# Patient Record
Sex: Female | Born: 1985 | Race: White | Hispanic: Yes | Marital: Married | Smoking: Never smoker
Health system: Southern US, Community
[De-identification: ages and names within clinical notes are randomized; demographics above are authoritative.]

## PROBLEM LIST (undated history)

## (undated) DIAGNOSIS — R12 Heartburn: Secondary | ICD-10-CM

## (undated) DIAGNOSIS — Z789 Other specified health status: Secondary | ICD-10-CM

## (undated) DIAGNOSIS — F419 Anxiety disorder, unspecified: Secondary | ICD-10-CM

## (undated) DIAGNOSIS — M2550 Pain in unspecified joint: Secondary | ICD-10-CM

## (undated) DIAGNOSIS — R5383 Other fatigue: Secondary | ICD-10-CM

## (undated) DIAGNOSIS — R519 Headache, unspecified: Secondary | ICD-10-CM

## (undated) HISTORY — DX: Anxiety disorder, unspecified: F41.9

## (undated) HISTORY — PX: WISDOM TOOTH EXTRACTION: SHX21

## (undated) HISTORY — DX: Heartburn: R12

## (undated) HISTORY — DX: Other fatigue: R53.83

## (undated) HISTORY — DX: Pain in unspecified joint: M25.50

## (undated) HISTORY — DX: Other specified health status: Z78.9

## (undated) HISTORY — DX: Headache, unspecified: R51.9

---

## 2009-08-30 ENCOUNTER — Inpatient Hospital Stay (HOSPITAL_COMMUNITY): Admission: AD | Admit: 2009-08-30 | Discharge: 2009-08-30 | Payer: Self-pay | Admitting: Anesthesiology

## 2009-10-05 ENCOUNTER — Ambulatory Visit (HOSPITAL_COMMUNITY): Admission: RE | Admit: 2009-10-05 | Discharge: 2009-10-05 | Payer: Self-pay | Admitting: Family Medicine

## 2010-02-15 ENCOUNTER — Ambulatory Visit (HOSPITAL_COMMUNITY)
Admission: RE | Admit: 2010-02-15 | Discharge: 2010-02-15 | Payer: Self-pay | Source: Home / Self Care | Attending: Family Medicine | Admitting: Family Medicine

## 2010-02-19 ENCOUNTER — Encounter: Payer: Self-pay | Admitting: Obstetrics & Gynecology

## 2010-02-19 ENCOUNTER — Inpatient Hospital Stay (HOSPITAL_COMMUNITY)
Admission: AD | Admit: 2010-02-19 | Discharge: 2010-02-22 | Payer: Self-pay | Source: Home / Self Care | Attending: Obstetrics & Gynecology | Admitting: Obstetrics & Gynecology

## 2010-02-20 LAB — CBC
HCT: 28.4 % — ABNORMAL LOW (ref 36.0–46.0)
Hemoglobin: 9.4 g/dL — ABNORMAL LOW (ref 12.0–15.0)
MCH: 29.9 pg (ref 26.0–34.0)
MCHC: 33.1 g/dL (ref 30.0–36.0)
MCV: 90.4 fL (ref 78.0–100.0)
Platelets: 153 10*3/uL (ref 150–400)
RBC: 3.14 MIL/uL — ABNORMAL LOW (ref 3.87–5.11)
RDW: 14.5 % (ref 11.5–15.5)
WBC: 11.4 10*3/uL — ABNORMAL HIGH (ref 4.0–10.5)

## 2010-04-29 LAB — CBC
HCT: 36.3 % (ref 36.0–46.0)
Hemoglobin: 12.1 g/dL (ref 12.0–15.0)
MCH: 29.9 pg (ref 26.0–34.0)
MCHC: 33.3 g/dL (ref 30.0–36.0)
MCV: 89.6 fL (ref 78.0–100.0)
Platelets: 167 10*3/uL (ref 150–400)
RBC: 4.05 MIL/uL (ref 3.87–5.11)
RDW: 14.1 % (ref 11.5–15.5)
WBC: 14 10*3/uL — ABNORMAL HIGH (ref 4.0–10.5)

## 2010-04-29 LAB — RPR: RPR Ser Ql: NONREACTIVE

## 2010-04-29 LAB — ABO/RH: ABO/RH(D): O POS

## 2010-05-05 LAB — CBC
HCT: 33.8 % — ABNORMAL LOW (ref 36.0–46.0)
Hemoglobin: 11.9 g/dL — ABNORMAL LOW (ref 12.0–15.0)
MCH: 32.7 pg (ref 26.0–34.0)
MCHC: 35.1 g/dL (ref 30.0–36.0)
MCV: 93 fL (ref 78.0–100.0)
Platelets: 228 10*3/uL (ref 150–400)
RBC: 3.64 MIL/uL — ABNORMAL LOW (ref 3.87–5.11)
RDW: 13.3 % (ref 11.5–15.5)
WBC: 12.9 10*3/uL — ABNORMAL HIGH (ref 4.0–10.5)

## 2010-05-05 LAB — URINALYSIS, ROUTINE W REFLEX MICROSCOPIC
Bilirubin Urine: NEGATIVE
Glucose, UA: NEGATIVE mg/dL
Hgb urine dipstick: NEGATIVE
Ketones, ur: NEGATIVE mg/dL
Nitrite: NEGATIVE
Protein, ur: NEGATIVE mg/dL
Specific Gravity, Urine: 1.025 (ref 1.005–1.030)
Urobilinogen, UA: 0.2 mg/dL (ref 0.0–1.0)
pH: 6 (ref 5.0–8.0)

## 2010-05-05 LAB — COMPREHENSIVE METABOLIC PANEL
ALT: 15 U/L (ref 0–35)
AST: 16 U/L (ref 0–37)
Albumin: 3.3 g/dL — ABNORMAL LOW (ref 3.5–5.2)
Alkaline Phosphatase: 40 U/L (ref 39–117)
BUN: 5 mg/dL — ABNORMAL LOW (ref 6–23)
CO2: 24 mEq/L (ref 19–32)
Calcium: 8.5 mg/dL (ref 8.4–10.5)
Chloride: 105 mEq/L (ref 96–112)
Creatinine, Ser: 0.37 mg/dL — ABNORMAL LOW (ref 0.4–1.2)
GFR calc Af Amer: >60 mL/min (ref >60)
GFR calc non Af Amer: >60 mL/min (ref >60)
Glucose, Bld: 88 mg/dL (ref 70–99)
Potassium: 3.5 mEq/L (ref 3.5–5.1)
Sodium: 132 mEq/L — ABNORMAL LOW (ref 135–145)
Total Bilirubin: 0.3 mg/dL (ref 0.3–1.2)
Total Protein: 6.3 g/dL (ref 6.0–8.3)

## 2010-05-05 LAB — WET PREP, GENITAL
Clue Cells Wet Prep HPF POC: NONE SEEN
Trich, Wet Prep: NONE SEEN
Yeast Wet Prep HPF POC: NONE SEEN

## 2010-05-05 LAB — GC/CHLAMYDIA PROBE AMP, GENITAL
Chlamydia, DNA Probe: NEGATIVE
GC Probe Amp, Genital: NEGATIVE

## 2010-05-17 NOTE — Discharge Summary (Signed)
  NAME:  Lacey Sanders, Lacey Sanders  ACCOUNT NO.:  [id number]  MEDICAL RECORD NO.:  [id number]          PATIENT TYPE:  INP  LOCATION:  9143                          FACILITY:  [facility]  PHYSICIAN:  Allie Bossier, MD        DATE OF BIRTH:  1985/04/14  DATE OF ADMISSION:  02/19/2010 DATE OF DISCHARGE:  02/22/2010                              DISCHARGE SUMMARY   REASON FOR ADMISSION:  Spontaneous rupture of membranes, pregnancy at term.  FINAL DIAGNOSES: 1. Intrauterine pregnancy at 40.4 weeks. 2. Placental abruption. 3. Nonreassuring fetal heart tones.  Labs on the day of discharge; hemoglobin 9.4, hematocrit 28.4, platelets within normal limits.  PROCEDURES:  Primary low transverse cesarean section via Pfannenstiel incision and drain of the left paratubal.  HISTORY AND PHYSICAL:  For complete history and physical, please refer to the history and physical at the time of admission.  In brief, this is a 25 year old gravida 1, para 0, who presented with 40 and 4 weeks with a spontaneous rupture of membranes and increased vaginal bleeding.  HOSPITAL COURSE:  This is a 25 year old gravida 1, para 2, who presented to the hospital with 40 and 4 weeks with spontaneous rupture of membranes and increased vaginal bleed.  During the hospitalization, the patient was doing okay, but at that time, baby is having some late decelerations with moderate variability and positive acceleration.  The cervix was 1 cm, 70% effaced.  Because of the abruption and the rupture of membranes and nonreassuring fetal heart tones, decided to proceed to have low transverse cesarean section to finally augment the pregnancy. During this procedure, no complications were reported.  On the day of discharge, the patient was doing okay with hemoglobin and hematocrit was stable.  We proceeded to discharge the patient.  Baby weight 3600 g. Apgars at 1 minute 3, 5 minutes 7, 10 minutes 8.  FOLLOWUP INSTRUCTIONS:  ACTIVITY:   Bedrest for 6 weeks.  DIET:  Routine diet.  MEDICATIONS: 1. Motrin 600 mg q.6 hours. 2. Percocet 325/5 q.4 hours for pain. 3. Micronor patch.  The patient plans to breast-feeding.  Follow up will be __________ 6 weeks.    ______________________________ Presley Raddle, MD   ______________________________ Allie Bossier, MD    AM/MEDQ  D:  02/22/2010  T:  02/22/2010  Job:  [id number]  Electronically Signed by Nicholaus Bloom MD on 05/17/2010 11:21:21 AM

## 2012-08-19 ENCOUNTER — Ambulatory Visit: Payer: No Typology Code available for payment source | Attending: Family Medicine | Admitting: Internal Medicine

## 2012-08-19 VITALS — BP 121/77 | HR 62 | Temp 97.5°F | Ht 60.5 in | Wt 139.0 lb

## 2012-08-19 DIAGNOSIS — R109 Unspecified abdominal pain: Secondary | ICD-10-CM

## 2012-08-19 LAB — POCT URINE PREGNANCY: Preg Test, Ur: NEGATIVE

## 2012-08-19 NOTE — Progress Notes (Unsigned)
Patient ID: Lacey Sanders, female   DOB: Feb 06, 1986, 27 y.o.   MRN: [id number]  CC:  HPI: 27 year old female who is here for evaluation of pelvic pain and left and right lower quadrant. The patient does not complain of any diarrhea or constipation, denies any nausea vomiting. The pain is not related to her menstrual cramps. The patient has pain in between her menstrual cramps. She is currently on oral contraceptive medication. She denies any history of endometriosis. Her periods are every 3 months.    No Known Allergies No past medical history on file. No current outpatient prescriptions on file prior to visit.   No current facility-administered medications on file prior to visit.   No family history on file. History   Social History  . Marital Status: Single    Spouse Name: N/A    Number of Children: N/A  . Years of Education: N/A   Occupational History  . Not on file.   Social History Main Topics  . Smoking status: Never Smoker   . Smokeless tobacco: Not on file  . Alcohol Use: No  . Drug Use: No  . Sexually Active: Not on file   Other Topics Concern  . Not on file   Social History Narrative  . No narrative on file    Review of Systems  Constitutional: Negative for fever, chills, diaphoresis, activity change, appetite change and fatigue.  HENT: Negative for ear pain, nosebleeds, congestion, facial swelling, rhinorrhea, neck pain, neck stiffness and ear discharge.   Eyes: Negative for pain, discharge, redness, itching and visual disturbance.  Respiratory: Negative for cough, choking, chest tightness, shortness of breath, wheezing and stridor.   Cardiovascular: Negative for chest pain, palpitations and leg swelling.  Gastrointestinal: Negative for abdominal distention.  Genitourinary: Negative for dysuria, urgency, frequency, hematuria, flank pain, decreased urine volume, difficulty urinating and dyspareunia.  Musculoskeletal: Negative for back pain, joint  swelling, arthralgias and gait problem.  Neurological: Negative for dizziness, tremors, seizures, syncope, facial asymmetry, speech difficulty, weakness, light-headedness, numbness and headaches.  Hematological: Negative for adenopathy. Does not bruise/bleed easily.  Psychiatric/Behavioral: Negative for hallucinations, behavioral problems, confusion, dysphoric mood, decreased concentration and agitation.    Objective:   Filed Vitals:   08/19/12 1615  BP: 121/77  Pulse: 62  Temp: 97.5 F (36.4 C)    Physical Exam  Constitutional: Appears well-developed and well-nourished. No distress.  HENT: Normocephalic. External right and left ear normal. Oropharynx is clear and moist.  Eyes: Conjunctivae and EOM are normal. PERRLA, no scleral icterus.  Neck: Normal ROM. Neck supple. No JVD. No tracheal deviation. No thyromegaly.  CVS: RRR, S1/S2 +, no murmurs, no gallops, no carotid bruit.  Pulmonary: Effort and breath sounds normal, no stridor, rhonchi, wheezes, rales.  Abdominal: Soft. BS +,  no distension, tenderness, rebound or guarding.  Musculoskeletal: Normal range of motion. No edema and no tenderness.  Lymphadenopathy: No lymphadenopathy noted, cervical, inguinal. Neuro: Alert. Normal reflexes, muscle tone coordination. No cranial nerve deficit. Skin: Skin is warm and dry. No rash noted. Not diaphoretic. No erythema. No pallor.  Psychiatric: Normal mood and affect. Behavior, judgment, thought content normal.   Lab Results  Component Value Date   WBC 11.4* 02/20/2010   HGB 9.4 DELTA CHECK NOTED REPEATED TO VERIFY* 02/20/2010   HCT 28.4* 02/20/2010   MCV 90.4 02/20/2010   PLT 153 02/20/2010   Lab Results  Component Value Date   CREATININE 0.37* 08/30/2009   BUN 5* 08/30/2009   NA 132* 08/30/2009  K 3.5 08/30/2009   CL 105 08/30/2009   CO2 24 08/30/2009    No results found for this basename: HGBA1C   Lipid Panel  No results found for this basename: chol, trig, hdl, cholhdl, vldl, ldlcalc        Assessment and plan:   There are no active problems to display for this patient.   Pelvic pain #1 obtain CBC, CMP, lipase #2 pelvic ultrasound will rule out the appearance is, fibroid, #3 urine pregnancy test as the patient stopped her contraceptive a week ago #4 followup visit in one week, I would encourage to repeat her urine pregnancy test and at that point as well #5 if the pelvic ultrasound was normal she may need a gynecologic referral as well as a CT scan for further evaluation of her pelvic pain

## 2012-08-20 LAB — COMPREHENSIVE METABOLIC PANEL
ALT: 19 U/L (ref 0–35)
AST: 20 U/L (ref 0–37)
Albumin: 4.6 g/dL (ref 3.5–5.2)
Alkaline Phosphatase: 49 U/L (ref 39–117)
BUN: 10 mg/dL (ref 6–23)
CO2: 31 mEq/L (ref 19–32)
Calcium: 9.5 mg/dL (ref 8.4–10.5)
Chloride: 102 mEq/L (ref 96–112)
Creat: 0.67 mg/dL (ref 0.50–1.10)
Glucose, Bld: 76 mg/dL (ref 70–99)
Potassium: 3.7 mEq/L (ref 3.5–5.3)
Sodium: 140 mEq/L (ref 135–145)
Total Bilirubin: 0.3 mg/dL (ref 0.3–1.2)
Total Protein: 7.5 g/dL (ref 6.0–8.3)

## 2012-08-20 LAB — URINALYSIS W MICROSCOPIC + REFLEX CULTURE
Bacteria, UA: NONE SEEN
Bilirubin Urine: NEGATIVE
Casts: NONE SEEN
Crystals: NONE SEEN
Glucose, UA: NEGATIVE mg/dL
Hgb urine dipstick: NEGATIVE
Ketones, ur: NEGATIVE mg/dL
Leukocytes, UA: NEGATIVE
Nitrite: NEGATIVE
Protein, ur: NEGATIVE mg/dL
Specific Gravity, Urine: 1.009 (ref 1.005–1.030)
Squamous Epithelial / HPF: NONE SEEN
Urobilinogen, UA: 0.2 mg/dL (ref 0.0–1.0)
pH: 7 (ref 5.0–8.0)

## 2012-08-23 ENCOUNTER — Telehealth: Payer: Self-pay | Admitting: Family Medicine

## 2012-08-23 NOTE — Telephone Encounter (Signed)
Pt was told by [place] to call [phone] to schedule appt for utlrasound. When she called she was told that the appointment must be made by clinical staff here at [facility].

## 2012-08-24 LAB — CBC WITH DIFFERENTIAL/PLATELET

## 2012-08-25 ENCOUNTER — Ambulatory Visit (HOSPITAL_COMMUNITY)
Admission: RE | Admit: 2012-08-25 | Discharge: 2012-08-25 | Disposition: A | Discharge status: Home / Self Care | Payer: No Typology Code available for payment source | Source: Ambulatory Visit | Attending: Internal Medicine | Admitting: Internal Medicine

## 2012-08-25 DIAGNOSIS — N949 Unspecified condition associated with female genital organs and menstrual cycle: Secondary | ICD-10-CM | POA: Insufficient documentation

## 2012-08-25 DIAGNOSIS — R109 Unspecified abdominal pain: Secondary | ICD-10-CM

## 2012-08-25 DIAGNOSIS — N854 Malposition of uterus: Secondary | ICD-10-CM | POA: Insufficient documentation

## 2012-08-26 ENCOUNTER — Ambulatory Visit: Payer: No Typology Code available for payment source

## 2012-08-27 ENCOUNTER — Other Ambulatory Visit (HOSPITAL_COMMUNITY)
Admission: RE | Admit: 2012-08-27 | Discharge: 2012-08-27 | Disposition: A | Discharge status: Home / Self Care | Payer: No Typology Code available for payment source | Source: Ambulatory Visit | Attending: Family Medicine | Admitting: Family Medicine

## 2012-08-27 ENCOUNTER — Ambulatory Visit: Payer: No Typology Code available for payment source | Attending: Family Medicine | Admitting: Internal Medicine

## 2012-08-27 VITALS — BP 120/71 | HR 66 | Temp 98.4°F | Resp 18 | Ht 65.0 in | Wt 140.0 lb

## 2012-08-27 DIAGNOSIS — R109 Unspecified abdominal pain: Secondary | ICD-10-CM | POA: Insufficient documentation

## 2012-08-27 DIAGNOSIS — Z113 Encounter for screening for infections with a predominantly sexual mode of transmission: Secondary | ICD-10-CM | POA: Insufficient documentation

## 2012-08-27 DIAGNOSIS — M25559 Pain in unspecified hip: Secondary | ICD-10-CM | POA: Insufficient documentation

## 2012-08-27 DIAGNOSIS — N760 Acute vaginitis: Secondary | ICD-10-CM | POA: Insufficient documentation

## 2012-08-27 LAB — CBC
HCT: 41.8 % (ref 36.0–46.0)
Hemoglobin: 14.7 g/dL (ref 12.0–15.0)
MCH: 30.8 pg (ref 26.0–34.0)
MCHC: 35.2 g/dL (ref 30.0–36.0)
MCV: 87.6 fL (ref 78.0–100.0)
Platelets: 313 10*3/uL (ref 150–400)
RBC: 4.77 MIL/uL (ref 3.87–5.11)
RDW: 12.8 % (ref 11.5–15.5)
WBC: 8.6 10*3/uL (ref 4.0–10.5)

## 2012-08-27 NOTE — Progress Notes (Signed)
Lacey Sanders ID: Lacey Sanders, female   DOB: April 17, 1985, 27 y.o.   MRN: [id number] Lacey Sanders Demographics  Lacey Sanders, is a 27 y.o. female  WGN:[id number]  HQI:[id number]  DOB - 11-20-1985  Chief Complaint  Lacey Sanders presents with  . Follow-up    review lab and [place] of pelvis        Subjective:   Lacey Sanders with History of pelvic pain for the last one month was seen here last week is here for followup, pelvic pain and some better denies any vaginal discharge, denies any diarrhea blood or mucus in stool, pain is in the suprapubic area, no aggravating or relieving factors , no other associated symptoms. No fever chills.   Denies any subjective complaints except as above, no active headache, no chest abdominal pain at this time, not short of breath. No focal weakness which is new. no nausea vomiting.   Objective:    Lacey Sanders Active Problem List   Diagnosis Date Noted  . Pain in joint, pelvic region and thigh 08/27/2012     Filed Vitals:   08/27/12 1329 08/27/12 1330  BP: 120/71 120/71  Pulse: 66 66  Temp: 98.4 F (36.9 C) 98.4 F (36.9 C)  TempSrc: Oral Oral  Resp: 18 18  Height: 5\' 5"  (1.651 m) 5\' 5"  (1.651 m)  Weight: 140 lb (63.504 kg) 140 lb (63.504 kg)  SpO2: 100% 100%     Exam   Awake Alert, Oriented X 3, No new F.N deficits, Normal affect Struble.AT,PERRAL Supple Neck,No JVD, No cervical lymphadenopathy appriciated.  Symmetrical Chest wall movement, Good air movement bilaterally, CTAB RRR,No Gallops,Rubs or new Murmurs, No Parasternal Heave +ve B.Sounds, Abd Soft, Non tender, No organomegaly appriciated, No rebound - guarding or rigidity. No Cyanosis, Clubbing or edema, No new Rash or bruise    Pelvic exam - no adnexal tenderness no unusual vaginal discharge or odor     Data Review   CBC No results found for this basename: WBC, HGB, HCT, PLT, MCV, MCH, MCHC, RDW, NEUTRABS, LYMPHSABS, MONOABS, EOSABS, BASOSABS, BANDABS,  BANDSABD,  in the last 168 hours  Chemistries   No results found for this basename: NA, K, CL, CO2, GLUCOSE, BUN, CREATININE, GFRCGP, CALCIUM, MG, AST, ALT, ALKPHOS, BILITOT,  in the last 168 hours ------------------------------------------------------------------------------------------------------------------ No results found for this basename: HGBA1C,  in the last 72 hours ------------------------------------------------------------------------------------------------------------------ No results found for this basename: CHOL, HDL, LDLCALC, TRIG, CHOLHDL, LDLDIRECT,  in the last 72 hours ------------------------------------------------------------------------------------------------------------------ No results found for this basename: TSH, T4TOTAL, FREET3, T3FREE, THYROIDAB,  in the last 72 hours ------------------------------------------------------------------------------------------------------------------ No results found for this basename: VITAMINB12, FOLATE, FERRITIN, TIBC, IRON, RETICCTPCT,  in the last 72 hours  Coagulation profile  No results found for this basename: INR, PROTIME,  in the last 168 hours     Prior to Admission medications   Not on File     Assessment & Plan   Suprapubic pain ongoing for one month. CMP, UA urine pregnancy test and pelvic ultrasound done last month are unremarkable. Pelvic exam is unremarkable no adnexal tenderness, no unusual vaginal discharge, I will order urine ancillary test to rule out sexually transmitted disease along with a CBC. Lacey Sanders to come back in a week. If her pain discomfort continues we will repeat a urine pregnancy test and if negative order CT scan abdomen pelvis.      Routine health maintenance.  Screening labs. BMP checked last week stable we will order a CBC    Her  Pap smear done a few months ago at [facility] was unremarkable per Lacey Sanders     Leroy Sea M.D on 08/27/2012 at 1:41 PM

## 2012-08-27 NOTE — Progress Notes (Signed)
Patient states she is here to get results of urine test during last visit and [place] results of pelvis for pelvis pain. States pain is starting to get better.

## 2012-08-28 LAB — URINALYSIS W MICROSCOPIC + REFLEX CULTURE
Bacteria, UA: NONE SEEN
Bilirubin Urine: NEGATIVE
Casts: NONE SEEN
Crystals: NONE SEEN
Glucose, UA: NEGATIVE mg/dL
Hgb urine dipstick: NEGATIVE
Ketones, ur: NEGATIVE mg/dL
Leukocytes, UA: NEGATIVE
Nitrite: NEGATIVE
Protein, ur: NEGATIVE mg/dL
Specific Gravity, Urine: 1.009 (ref 1.005–1.030)
Squamous Epithelial / HPF: NONE SEEN
Urobilinogen, UA: 0.2 mg/dL (ref 0.0–1.0)
pH: 7.5 (ref 5.0–8.0)

## 2012-09-03 ENCOUNTER — Ambulatory Visit: Payer: No Typology Code available for payment source

## 2012-09-03 ENCOUNTER — Encounter: Payer: Self-pay | Admitting: Family Medicine

## 2012-09-03 ENCOUNTER — Ambulatory Visit: Payer: No Typology Code available for payment source | Attending: Family Medicine | Admitting: Family Medicine

## 2012-09-03 VITALS — BP 121/82 | HR 80 | Temp 98.1°F | Resp 16 | Ht 62.0 in | Wt 140.8 lb

## 2012-09-03 DIAGNOSIS — M25559 Pain in unspecified hip: Secondary | ICD-10-CM | POA: Insufficient documentation

## 2012-09-03 DIAGNOSIS — K589 Irritable bowel syndrome without diarrhea: Secondary | ICD-10-CM

## 2012-09-03 DIAGNOSIS — R100 Acute abdomen: Secondary | ICD-10-CM

## 2012-09-03 DIAGNOSIS — R1031 Right lower quadrant pain: Secondary | ICD-10-CM | POA: Insufficient documentation

## 2012-09-03 DIAGNOSIS — R109 Unspecified abdominal pain: Secondary | ICD-10-CM

## 2012-09-03 LAB — POCT URINALYSIS DIPSTICK
Bilirubin, UA: NEGATIVE
Glucose, UA: NEGATIVE
Ketones, UA: NEGATIVE
Leukocytes, UA: NEGATIVE
Nitrite, UA: NEGATIVE
Protein, UA: NEGATIVE
Spec Grav, UA: 1.015
Urobilinogen, UA: 0.2
pH, UA: 7.5

## 2012-09-03 LAB — POCT URINE PREGNANCY: Preg Test, Ur: NEGATIVE

## 2012-09-03 NOTE — Patient Instructions (Addendum)
OVER THE COUNTER ACID MEDICATIONS PRILOSEC, OMEPRAZOLE OR ZANTAC, RANITIDINE   Dieta para el reflujo gastroesofgico - Adultos  (Diet for Gastroesophageal Reflux Disease, Adult)  El reflujo (reflujo cido) ocurre cuando el cido del estmago pasa al esfago. Cuando el cido entra en contacto con el esfago, el cido provoca dolor e irritacin (inflamacin) en el esfago. Cuando el reflujo ocurre a menudo o es tan grave que causa dao en el esfago, se denomina enfermedad por reflujo gastroesofgico (ERGE). La terapia nutricional puede ayudar a Acupuncturist de la [place].  ALIMENTOS O BEBIDAS QUE DEBE EVITAR O LIMITAR   Fumar o consumir tabaco. La nicotina es uno de los estimulantes ms potentes en la produccin de cido en el tracto gastrointestinal.  Caf y t negro con cafena o descafeinado.  Gaseosas comunes o bajas caloras o bebidas energizantes (las gaseosas sin cafena estn permitidas).   Especias picantes, como la pimienta negra, pimienta blanca, pimienta roja, pimienta de cayena, curry en polvo,y [place] en polvo.  Menta y mentol.  Chocolate.  Alimentos con alto contenido de grasas, incluyendo las carnes y comidas fritas. El agregado de [place] extra, por ejemplo aceite, [place], aderezo para ensaladas y nueces. Limite estos alimentos a menos de 8 cucharaditas por da.  Las frutas y verduras si no son toleradas, tales como frutas ctricas o tomates.  El alcohol.  Todo alimento que agrave el trastorno. Si tiene dudas relacionadas con la dieta, comunquese con el profesional que lo asiste o con un nutricionista matriculado.  OTROS FACTORES QUE PUEDEN ALIVIAR EL ERGE SON:   Comer lentamente, en un clima distendido.  Hacer 5 o 6 comidas pequeas por da en vez de tres grandes.  Suprimir por un [employer] alimentos que causen problemas.  No acostarse hasta despus de 3 horas de haber comido.  Mantener la cabeza elevada 6 a 9 pulgadas (15 a 23 cm) usando una cua de espuma  o bloques debajo de las patas de la cama. Si permanece en una postura plana har empeorar los sntomas.  Mantngase fsicamente activo. Perder peso puede ser de ayuda para reducir el [employer] adultos obesos o con sobrepeso.  Use ropas sueltas. EJEMPLO DE UN PLAN DE ALIMENTACIN  Este plan de alimentacin consiste en aproximadamente 2 000 caloras, segn las guas de alimentacin de https://www.bernard.org/.  Desayuno   taza de avena cocida.  1 porcin de fresas.  1 taza de [employer].  1 oz de almendras. Colacin  1 taza de rebanadas de pepino.  6 oz de yogur (elaborado con [employer] con bajo contenido de grasas o descremada). Almuerzo:  2 rebanada de pan integral.  2 oz de rebanadas de pavo.  2 cucharaditas de mayonesa.  1 taza de arndanos.  1 taza de guisantes. Colacin  6 crackers integrales.  1 oz ( 28 g) de queso en hebras. Cena   taza de arroz integral.  1 taza de vegetales variados.  1 cucharadita de aceite de oliva.  3 oz ( 84 g) de pescado grill. Document Released: 11/13/2004 Document Revised: 04/28/2011 [facility] Patient Information 2014 [place], [place].   Sndrome de colon irritable (Irritable Bowel Syndrome) El sndrome de colon irritable se origina en un trastorno de la funcin normal del intestino Tambin se lo denomina colon espstico, colitis mucosa y colon irritable. No es necesario realizar un tratamiento quirrgico, ni es un problema que pueda transformarse en cncer. No hay cura para este sndrome. Pero con Neomia Dear dieta apropiada, reduccin del estrs y Tourist information centre manager, usted  notar que sus problemas (sntomas) gradualmente desaparecern o mejorarn. Se trata de una enfermedad frecuente del aparato digestivo. Aparece con frecuencia a finales de la adolescencia o comienzos de la vida adulta., La proporcin de mujeres que sufren este trastorno es del doble que los hombres. CAUSAS Luego que el alimento es digerido y absorbido en el intestino  delgado, Animator de desecho se dirige hacia el colon (intestino grueso). En el colon se absorben el agua y las sales que provienen de los alimentos no digeridos que se encuentran en el intestino delgado. Los residuos que [place], o materia fecal, se retiene para su posterior eliminacin. Bajo circunstancias normales, ciertas contracciones suaves y rtmicas de las paredes del intestino empujan la materia fecal a lo largo del colon, hacia el recto. Sin embargo, cuando se presenta este problema, estas contracciones son irregulares y no coordinadas. Entonces ocurre que la materia fecal se retiene por un perodo prolongado, lo que origina constipacin, o se expele demasiado rpido, lo que produce diarrea. SNTOMAS El sntoma ms frecuente es Chief Technology Officer. Lo ms frecuente es que el dolor se localice en la zona izquierda inferior del vientre (abdomen). Pero puede ocurrir en cualquier rea del abdomen. Puede sentirse como acidez de [place], Engineer, mining de espalda o Teacher, adult education un dolor sordo en los brazos o en los hombros. El dolor se origina en espasmos excesivos de los msculos del intestino y de la acumulacin de gases y materia fecal en el colon. Este dolor:  Puede oscilar entre clicos agudos en el vientre (abdomen) hasta un dolor sordo y continuo.  Generalmente empeora luego de comer.  Es caracterstico que se alivie al mover el intestino o [employer] gases. Generalmente se acompaa de constipacin. Pero tambin puede P[employer] Inc. La diarrea se produce inmediatamente despus de comer o al levantarse por la maana. Las heces son blandas y International aid/development worker. Con frecuencia estn mezcladas con secreciones (mucus). Otros sntomas son:  Product manager  [employer]  Prdida del apetito  Ganas de vomitar (nuseas)  Vmitos Esta enfermedad tambin puede causar otros sntomas que no se relacionan con el sistema digestivo.  Fatiga.  Estados de ansiedad  Dificultades de Librarian, academic.  Dolor de  [place].  Falta de [employer]. Estos sntomas tienden a Research officer, trade union y Geneticist, molecular. DIAGNSTICO Los sntomas se asemejan a los de otras enfermedades ms graves del aparato digestivo. Por lo tanto el profesional que lo asiste le indicar una serie adicional de anlisis para descartarlas. Debe estar seguro de hallar la causa (diagnostico) En este caso, se le explicar la naturaleza y el propsito de cada prueba. TRATAMIENTO Existe un buen nmero de medicamentos disponibles que ayudan a corregir el funcionamiento intestinal y/o a Occupational hygienist intestinales y Chief Technology Officer abdominal. Los medicamentos disponibles son:  Jodi Marble, no irritantes para los casos de constipacin grave y para Contractor a Dietitian.  Medicamentos antidiarreicos especficos para tratar los [place] graves o prolongados de [place].  Antiespasmdicos para Copywriter, advertising.  El profesional que lo asiste tambin podr indicarle un tranquilizante o sedante suave durante los perodos extraordinarios de estrs en su vida. Lo ms importante es recordar que si le prescriben un medicamento, deber tomarlo exactamente como se lo han indicado. Hgale saber al profesional que lo asiste si ha obtenido alivio al tomarlo. INSTRUCCIONES PARA EL CUIDADO DOMICILIARIO  Evite los alimentos ricos en grasas o aceites. Por ejemplo, crema, manteca, salchichas, embutidos y otras comidas grasas.  Evite los alimentos que puedan [employer]  laxantes, como frutas, jugos de fruta y productos lcteos.  Elimine las bebidas gaseosas, la goma de [place] y los alimentos que producen gases como frijoles y col. Esto ayuda a Technical sales engineer hinchazn y los eructos.  Consuma salvado con gran cantidad de lquidos puede ayudar a combatir la constipacin.  Observe cules son los alimentos que originan sus sntomas.  Evite las situaciones de gran carga emocional o las circunstancias que producen  ansiedad.  Comience o contine con un plan de ejercicios.  Descanse y duerma lo suficiente. EST SEGURO QUE:   Comprende las instrucciones para el alta mdica.  Controlar su enfermedad.  Solicitar atencin mdica de inmediato segn las indicaciones. Document Released: 02/03/2005 Document Revised: 04/28/2011 [facility] Patient Information 2014 [place], [place].

## 2012-09-03 NOTE — Progress Notes (Signed)
Patient ID: Lacey Sanders, female   DOB: 05-03-1985, 27 y.o.   MRN: [id number]  CC: follow up  Interpreter used to communicate  HPI: Pt reports that she is having much less abdominal pain but it has been intermittent.  She says that she does have acid reflux symptoms on occasion with spicy foods.  She is not aware of foods that could be causing more problems with abdominal pain.  She says that she has intermittent constipation and diarrhea. No known food allergies.    No Known Allergies History reviewed. No pertinent past medical history. No current outpatient prescriptions on file prior to visit.   No current facility-administered medications on file prior to visit.   History reviewed. No pertinent family history. History   Social History  . Marital Status: Single    Spouse Name: N/A    Number of Children: N/A  . Years of Education: N/A   Occupational History  . Not on file.   Social History Main Topics  . Smoking status: Never Smoker   . Smokeless tobacco: Not on file  . Alcohol Use: No  . Drug Use: No  . Sexually Active: Not on file   Other Topics Concern  . Not on file   Social History Narrative  . No narrative on file    Review of Systems  Constitutional: Negative for fever, chills, diaphoresis, activity change, appetite change and fatigue.  HENT: Negative for ear pain, nosebleeds, congestion, facial swelling, rhinorrhea, neck pain, neck stiffness and ear discharge.   Eyes: Negative for pain, discharge, redness, itching and visual disturbance.  Respiratory: Negative for cough, choking, chest tightness, shortness of breath, wheezing and stridor.   Cardiovascular: Negative for chest pain, palpitations and leg swelling.  Gastrointestinal: Negative for abdominal distention. As above.  Genitourinary: Negative for dysuria, urgency, frequency, hematuria, flank pain, decreased urine volume, difficulty urinating and dyspareunia.  Musculoskeletal: Negative for  back pain, joint swelling, arthralgias and gait problem.  Neurological: Negative for dizziness, tremors, seizures, syncope, facial asymmetry, speech difficulty, weakness, light-headedness, numbness and headaches.  Hematological: Negative for adenopathy. Does not bruise/bleed easily.  Psychiatric/Behavioral: Negative for hallucinations, behavioral problems, confusion, dysphoric mood, decreased concentration and agitation.    Objective:   Filed Vitals:   09/03/12 1607  BP: 121/82  Pulse: 80  Temp: 98.1 F (36.7 C)  Resp: 16    Physical Exam  Constitutional: Appears well-developed and well-nourished. No distress.  HENT: Normocephalic. External right and left ear normal. Oropharynx is clear and moist.  Eyes: Conjunctivae and EOM are normal. PERRLA, no scleral icterus.  Neck: Normal ROM. Neck supple. No JVD. No tracheal deviation. No thyromegaly.  CVS: RRR, S1/S2 +, no murmurs, no gallops, no carotid bruit.  Pulmonary: Effort and breath sounds normal, no stridor, rhonchi, wheezes, rales.  Abdominal: Soft. BS +,  no distension, tenderness, rebound or guarding.  Musculoskeletal: Normal range of motion. No edema and no tenderness.  Lymphadenopathy: No lymphadenopathy noted, cervical, inguinal. Neuro: Alert. Normal reflexes, muscle tone coordination. No cranial nerve deficit. Skin: Skin is warm and dry. No rash noted. Not diaphoretic. No erythema. No pallor.  Psychiatric: Normal mood and affect. Behavior, judgment, thought content normal.   Lab Results  Component Value Date   WBC 8.6 08/27/2012   HGB 14.7 08/27/2012   HCT 41.8 08/27/2012   MCV 87.6 08/27/2012   PLT 313 08/27/2012   Lab Results  Component Value Date   CREATININE 0.67 08/19/2012   BUN 10 08/19/2012   NA 140  08/19/2012   K 3.7 08/19/2012   CL 102 08/19/2012   CO2 31 08/19/2012    No results found for this basename: HGBA1C   Lipid Panel  No results found for this basename: chol, trig, hdl, cholhdl, vldl, ldlcalc        Assessment and plan:   Patient Active Problem List   Diagnosis Date Noted  . Abdominal pain, unspecified site 09/03/2012  . IBS (irritable bowel syndrome) 09/03/2012  . Pain in joint, pelvic region and thigh 08/27/2012    Suspected IBS Instructions provided for patient today and counseling. Recommend patient keep a food diary.   Treat acid reflux with OTC agents Reviewed test results with patient today  Follow up in 4 months  The patient was given clear instructions to go to ER or return to medical center if symptoms don't improve, worsen or new problems develop.  The patient verbalized understanding.  The patient was told to call to get any lab results if not heard anything in the next week.    Refer to gynecology for evaluation of endometriosis  Rodney Langton, MD, CDE, FAAFP [facility] [place], [place]

## 2012-09-03 NOTE — Progress Notes (Signed)
Quick Note:  Please inform patient that pregnancy test was negative and urinalysis neg for infection.   Rodney Langton, MD, CDE, FAAFP [facility] [place], [place]   ______

## 2012-09-03 NOTE — Progress Notes (Signed)
Pt here with Spanish interpretor. C/o umbilicus pain radiating to lower abdomen intermitt [id number]. Neg pregnancy test last mnth Denies n/v//fever Lab results

## 2012-09-06 ENCOUNTER — Telehealth: Payer: Self-pay | Admitting: Family Medicine

## 2012-09-06 NOTE — Telephone Encounter (Signed)
Pt here on Friday and has question about symptom.  Pt have traveling pain from in lower abd when she has to urinate/deficate and then after relieving herself feels pain again.  Yesterday when pt had urge for bowel movement, did not defecate but when she wiped noticed vaginal clear/brownish. Would like to know if this is normal in terms of her visit on Friday or if she needs to be seen again? Please f/u with pt.

## 2012-09-07 ENCOUNTER — Telehealth: Payer: Self-pay | Admitting: *Deleted

## 2012-09-07 NOTE — Telephone Encounter (Signed)
09/07/12 Unable to reach patient message left via telephone that pregnancy test was negative. And urine negative for infection per Dr. Laural Benes P.[facility] BSN MHA

## 2012-09-07 NOTE — Telephone Encounter (Signed)
09/07/12  

## 2012-09-14 ENCOUNTER — Telehealth: Payer: Self-pay | Admitting: *Deleted

## 2012-09-14 NOTE — Progress Notes (Signed)
Quick Note:  Patient has Vaginal infection ask her to come to the clinic for repeat checkup, call in flagyl 500 mg PO TID for 1 week supply. ______

## 2012-09-15 ENCOUNTER — Telehealth: Payer: Self-pay | Admitting: *Deleted

## 2012-09-15 NOTE — Telephone Encounter (Signed)
09/15/12 Patient made aware vaginal infection ask pt.to come back for repeat checkup per Dr. Thedore Mins. Flagyl 500 mg po tid for 1 week to [facility] pharmacy on [address] per patient request. P.Kadin Bera,RN BSN MHA

## 2012-09-23 ENCOUNTER — Encounter: Payer: Self-pay | Admitting: Internal Medicine

## 2012-09-23 ENCOUNTER — Ambulatory Visit: Payer: No Typology Code available for payment source | Attending: Family Medicine | Admitting: Internal Medicine

## 2012-09-23 VITALS — BP 122/77 | HR 81 | Temp 98.3°F | Resp 16 | Ht 62.0 in | Wt 142.6 lb

## 2012-09-23 DIAGNOSIS — R109 Unspecified abdominal pain: Secondary | ICD-10-CM

## 2012-09-23 DIAGNOSIS — K589 Irritable bowel syndrome without diarrhea: Secondary | ICD-10-CM | POA: Insufficient documentation

## 2012-09-23 DIAGNOSIS — K219 Gastro-esophageal reflux disease without esophagitis: Secondary | ICD-10-CM

## 2012-09-23 DIAGNOSIS — N390 Urinary tract infection, site not specified: Secondary | ICD-10-CM

## 2012-09-23 DIAGNOSIS — R42 Dizziness and giddiness: Secondary | ICD-10-CM

## 2012-09-23 LAB — POCT URINALYSIS DIPSTICK
Bilirubin, UA: NEGATIVE
Glucose, UA: NEGATIVE
Ketones, UA: NEGATIVE
Leukocytes, UA: NEGATIVE
Nitrite, UA: NEGATIVE
Protein, UA: NEGATIVE
Spec Grav, UA: 1.015
Urobilinogen, UA: 0.2
pH, UA: 7.5

## 2012-09-23 MED ORDER — RANITIDINE HCL 150 MG PO TABS: 150.0000 mg | ORAL_TABLET | Freq: Every day | ORAL | Status: DC | PRN

## 2012-09-23 MED ORDER — DICYCLOMINE HCL 10 MG PO CAPS: 10.0000 mg | ORAL_CAPSULE | Freq: Two times a day (BID) | ORAL | Status: DC | PRN

## 2012-09-23 NOTE — Progress Notes (Signed)
Patient ID: Lacey Sanders, female   DOB: 12/12/1985, 27 y.o.   MRN: [id number] Patient Demographics  Lacey Sanders, is a 27 y.o. female  [id number]  [id number]  DOB - 1985-07-15  Chief Complaint  Patient presents with  . Follow-up    UTI WITH MEDICATION        Subjective:   Lacey Sanders today is here for a follow up visit.  States that she finished her Flagyl today. Has multiple complaints, - - abdominal discomfort, intermittent, is improving.  - Has GERD symptoms with spicy food. - She also states that she had fallen in the tub and sometimes has "pain in the left ovary".  - States also feels dizzy when she stands up, denies any vertigo type symptoms     Objective:    Filed Vitals:   09/23/12 1617  BP: 128/80  Pulse: 79  Temp: 98.3 F (36.8 C)  TempSrc: Oral  Resp: 16  Height: 5\' 2"  (1.575 m)  Weight: 142 lb 9.6 oz (64.683 kg)  SpO2: 100%     ALLERGIES:  No Known Allergies  PAST MEDICAL HISTORY: No past medical history on file.  MEDICATIONS AT HOME: Prior to Admission medications   Medication Sig Start Date End Date Taking? Authorizing Provider  dicyclomine (BENTYL) 10 MG capsule Take 1 capsule (10 mg total) by mouth 2 (two) times daily as needed. 09/23/12   Lauri Till Jenna Luo, MD  metroNIDAZOLE (FLAGYL) 500 MG tablet Take 500 mg by mouth 3 (three) times daily.    Historical Provider, MD  ranitidine (ZANTAC) 150 MG tablet Take 1 tablet (150 mg total) by mouth daily as needed for heartburn. 09/23/12   Lindsie Simar Jenna Luo, MD     Exam  General appearance :Awake, alert, NAD, Speech Clear.  HEENT: Atraumatic and Normocephalic, PERLA Neck: supple, no JVD. No cervical lymphadenopathy.  Chest: Clear to auscultation bilaterally, no wheezing, rales or rhonchi CVS: S1 S2 regular, no murmurs.  Abdomen: soft, NBS, NT, ND, no gaurding, rigidity or rebound. Extremities: no cyanosis or clubbing, B/L Lower Ext shows no  edema Neurology: Awake alert, and oriented X 3, CN II-XII intact, Non focal Skin: No Rash or lesions Wounds:N/A    Data Review   Basic Metabolic Panel: No results found for this basename: NA, K, CL, CO2, GLUCOSE, BUN, CREATININE, CALCIUM, MG, PHOS,  in the last 168 hours Liver Function Tests: No results found for this basename: AST, ALT, ALKPHOS, BILITOT, PROT, ALBUMIN,  in the last 168 hours  CBC: No results found for this basename: WBC, NEUTROABS, HGB, HCT, MCV, PLT,  in the last 168 hours  ------------------------------------------------------------------------------------------------------------------ No results found for this basename: HGBA1C,  in the last 72 hours ------------------------------------------------------------------------------------------------------------------ No results found for this basename: CHOL, HDL, LDLCALC, TRIG, CHOLHDL, LDLDIRECT,  in the last 72 hours ------------------------------------------------------------------------------------------------------------------ No results found for this basename: TSH, T4TOTAL, FREET3, T3FREE, THYROIDAB,  in the last 72 hours ------------------------------------------------------------------------------------------------------------------ No results found for this basename: VITAMINB12, FOLATE, FERRITIN, TIBC, IRON, RETICCTPCT,  in the last 72 hours  Coagulation profile  No results found for this basename: INR, PROTIME,  in the last 168 hours    Assessment & Plan   Active Problems:  Abdominal discomfort/irritable bowel syndrome - Reviewed Dr. Henriette Combs note and explained to the patient, she wants to some "medication", gave Bentyl as needed  GERD: Gave prescription for ranitidine as needed for heartburn  Dizziness: States all the time, denies vertigo, mostly on standing up, had fallen in the tub.  -  negative orthostatics checked in the clinic  -  Will send referral for Ophthalmic examination - CT head  ordered   Follow-up in 1 month     Amie Cowens M.D. 09/23/2012, 4:48 PM

## 2012-09-23 NOTE — Progress Notes (Signed)
PT HERE FOR REPEAT URINE TEST +VAG INFECTION. COMPLETED LAST DOSE OF PRESCRIBED FLAGYL 500MG  TODAY. SPANISH INTERPRETOR PRESENT.

## 2012-09-23 NOTE — Progress Notes (Signed)
ORTHOSTATS NEGATIVE. PT SCHEDULED FOR CT HEAD TOMORROW @ [facility]

## 2012-09-24 ENCOUNTER — Ambulatory Visit (HOSPITAL_COMMUNITY)
Admission: RE | Admit: 2012-09-24 | Discharge: 2012-09-24 | Disposition: A | Discharge status: Home / Self Care | Payer: No Typology Code available for payment source | Source: Ambulatory Visit | Attending: Internal Medicine | Admitting: Internal Medicine

## 2012-09-24 DIAGNOSIS — K219 Gastro-esophageal reflux disease without esophagitis: Secondary | ICD-10-CM

## 2012-09-24 DIAGNOSIS — N390 Urinary tract infection, site not specified: Secondary | ICD-10-CM

## 2012-09-24 DIAGNOSIS — K589 Irritable bowel syndrome without diarrhea: Secondary | ICD-10-CM

## 2012-09-24 DIAGNOSIS — R42 Dizziness and giddiness: Secondary | ICD-10-CM | POA: Insufficient documentation

## 2012-09-29 ENCOUNTER — Telehealth: Payer: Self-pay | Admitting: Internal Medicine

## 2012-09-29 NOTE — Telephone Encounter (Signed)
Pt calling about results for CT scan.  Please f/u with pt.

## 2012-09-29 NOTE — Telephone Encounter (Signed)
09/29/12  Patient requesting results of CT Of Head done on 09/23/12 with recommendations. P.Kailany Dinunzio,RN  BSN MHA

## 2012-10-25 ENCOUNTER — Ambulatory Visit: Payer: No Typology Code available for payment source

## 2012-11-05 ENCOUNTER — Ambulatory Visit: Payer: No Typology Code available for payment source | Attending: Internal Medicine | Admitting: Internal Medicine

## 2012-11-05 VITALS — BP 114/66 | HR 74 | Temp 98.0°F | Resp 18

## 2012-11-05 DIAGNOSIS — R42 Dizziness and giddiness: Secondary | ICD-10-CM | POA: Insufficient documentation

## 2012-11-05 DIAGNOSIS — Z23 Encounter for immunization: Secondary | ICD-10-CM

## 2012-11-05 NOTE — Progress Notes (Signed)
Patient here for results of her CT scan

## 2012-11-05 NOTE — Progress Notes (Signed)
Patient ID: Lacey Sanders, female   DOB: 1985/05/18, 27 y.o.   MRN: [id number] Patient Demographics  Lacey Sanders, is a 27 y.o. female  [id number]  [id number]  DOB - 1985-07-05  Chief Complaint  Patient presents with  . Results        Subjective:   [name] today is here for a follow up visit. Patient has No headache, No chest pain, No abdominal pain - No Nausea, No new weakness tingling or numbness, No Cough - SOB. Patient here for CT scan head results. She has no complaints at this time.  Objective:    Filed Vitals:   11/05/12 1744  BP: 114/66  Pulse: 74  Temp: 98 F (36.7 C)  Resp: 18  SpO2: 100%     ALLERGIES:  No Known Allergies  PAST MEDICAL HISTORY: History reviewed. No pertinent past medical history.  MEDICATIONS AT HOME: Prior to Admission medications   Medication Sig Start Date End Date Taking? Authorizing Provider  dicyclomine (BENTYL) 10 MG capsule Take 1 capsule (10 mg total) by mouth 2 (two) times daily as needed. 09/23/12   Ripudeep Jenna Luo, MD  metroNIDAZOLE (FLAGYL) 500 MG tablet Take 500 mg by mouth 3 (three) times daily.    Historical Provider, MD  ranitidine (ZANTAC) 150 MG tablet Take 1 tablet (150 mg total) by mouth daily as needed for heartburn. 09/23/12   Ripudeep Jenna Luo, MD     Exam  General appearance :Awake, alert, NAD, Speech Clear.  HEENT: Atraumatic and Normocephalic, PERLA Neck: supple, no JVD. No cervical lymphadenopathy.  Chest: Clear to auscultation bilaterally, no wheezing, rales or rhonchi CVS: S1 S2 regular, no murmurs.  Abdomen: soft, NBS, NT, ND, no gaurding, rigidity or rebound. Extremities: no cyanosis or clubbing, B/L Lower Ext shows no edema Neurology: Awake alert, and oriented X 3, CN II-XII intact, Non focal Skin: No Rash or lesions Wounds:N/A    Data Review   Basic Metabolic Panel: No results found for this basename: NA, K, CL, CO2, GLUCOSE, BUN, CREATININE,  CALCIUM, MG, PHOS,  in the last 168 hours Liver Function Tests: No results found for this basename: AST, ALT, ALKPHOS, BILITOT, PROT, ALBUMIN,  in the last 168 hours  CBC: No results found for this basename: WBC, NEUTROABS, HGB, HCT, MCV, PLT,  in the last 168 hours  ------------------------------------------------------------------------------------------------------------------ No results found for this basename: HGBA1C,  in the last 72 hours ------------------------------------------------------------------------------------------------------------------ No results found for this basename: CHOL, HDL, LDLCALC, TRIG, CHOLHDL, LDLDIRECT,  in the last 72 hours ------------------------------------------------------------------------------------------------------------------ No results found for this basename: TSH, T4TOTAL, FREET3, T3FREE, THYROIDAB,  in the last 72 hours ------------------------------------------------------------------------------------------------------------------ No results found for this basename: VITAMINB12, FOLATE, FERRITIN, TIBC, IRON, RETICCTPCT,  in the last 72 hours  Coagulation profile  No results found for this basename: INR, PROTIME,  in the last 168 hours    Assessment & Plan   Active Problems: Dizziness: Resolved, doing well no complaints CT head result reviewed with the patient, unremarkable  Health screening: - Flu shot today - Pap smear will be due in March 2015  Follow-up : Patient has appointment in November 2014     RAI,RIPUDEEP M.D. 11/05/2012, 5:59 PM

## 2013-01-04 ENCOUNTER — Ambulatory Visit: Payer: No Typology Code available for payment source | Attending: Internal Medicine | Admitting: Internal Medicine

## 2013-01-04 VITALS — BP 119/74 | HR 65 | Temp 97.7°F | Resp 16 | Ht 63.0 in | Wt 154.0 lb

## 2013-01-04 DIAGNOSIS — K589 Irritable bowel syndrome without diarrhea: Secondary | ICD-10-CM

## 2013-01-04 DIAGNOSIS — R109 Unspecified abdominal pain: Secondary | ICD-10-CM | POA: Insufficient documentation

## 2013-01-04 MED ORDER — DICYCLOMINE HCL 10 MG PO CAPS: 10.0000 mg | ORAL_CAPSULE | Freq: Two times a day (BID) | ORAL | Status: DC | PRN

## 2013-01-04 NOTE — Progress Notes (Signed)
Patient ID: Lacey Sanders, female   DOB: [birth year], 27 y.o.   MRN: [id number]   HPI 27 year old Hispanic female here with Spanish interpreter. She has history of vague infraumbilical abdominal pain for almost one year off and on. She also has symptoms of GERD. She reports that she still has off and on dull aching pain of her abdomen. Denies any radiation of her pain. Denies any nausea, vomiting, nocturnal symptoms, bowel or urinary symptoms. Denies any nasal symptoms except for noting some spotting during her last mental cycles. Denies any fever, chills, headache, chest pain, shortness of breath. Denies any change in her weight or appetite. During  previous visits she was prescribed dicyclomine for similar symptoms with concern for IBS but she has not taken any medications.  Objective:  Vital signs in last 24 hours:  Filed Vitals:   01/04/13 1630  BP: 119/74  Pulse: 65  Temp: 97.7 F (36.5 C)  TempSrc: Oral  Resp: 16  Height: 5\' 3"  (1.6 m)  Weight: 154 lb (69.854 kg)  SpO2: 100%     Physical Exam:  General: Young female in no acute distress. HEENT: no pallor, no icterus, moist oral mucosa,  Heart: Normal  s1 &s2  Regular rate and rhythm,  Lungs: Clear to auscultation bilaterally. Abdomen: Soft, nontender, nondistended, positive bowel sounds. Extremities: Warm, no edema    Lab Results:  Basic Metabolic Panel:    Component Value Date/Time   NA 140 08/19/2012 1703   K 3.7 08/19/2012 1703   CL 102 08/19/2012 1703   CO2 31 08/19/2012 1703   BUN 10 08/19/2012 1703   CREATININE 0.67 08/19/2012 1703   CREATININE 0.37* 08/30/2009 2135   GLUCOSE 76 08/19/2012 1703   CALCIUM 9.5 08/19/2012 1703   CBC:    Component Value Date/Time   WBC 8.6 08/27/2012 1350   HGB 14.7 08/27/2012 1350   HCT 41.8 08/27/2012 1350   PLT 313 08/27/2012 1350   MCV 87.6 08/27/2012 1350   NEUTROABS TEST NOT PERFORMED 08/19/2012 1703   LYMPHSABS TEST NOT PERFORMED 08/19/2012 1703   MONOABS TEST NOT  PERFORMED 08/19/2012 1703   EOSABS TEST NOT PERFORMED 08/19/2012 1703   BASOSABS TEST NOT PERFORMED 08/19/2012 1703    No results found for this or any previous visit (from the past 240 hour(s)).  Studies/Results: No results found.  Medications: Scheduled Meds: Continuous Infusions: PRN Meds:.    Assessment/Plan:  Abdominal pain Likely suggestive of irritable bowel syndrome. I would be that of perception for dicyclomine twice daily when necessary. Encouraged on avoiding fatty food and spicy foods and increased fluid intake and high fiber diet.  Followup in 6 months. RTC earlier if symptoms not improved  Maeleigh Buschman 01/04/2013, 5:49 PM

## 2013-01-04 NOTE — Progress Notes (Signed)
Pt is here following up on the abdominal pain. Pt reports having pain while taking deep breaths and bending down picking things up.

## 2013-04-15 ENCOUNTER — Ambulatory Visit: Payer: No Typology Code available for payment source | Attending: Internal Medicine

## 2013-06-09 ENCOUNTER — Encounter: Payer: Self-pay | Admitting: Internal Medicine

## 2013-06-09 ENCOUNTER — Ambulatory Visit: Payer: No Typology Code available for payment source | Attending: Internal Medicine | Admitting: Internal Medicine

## 2013-06-09 VITALS — BP 123/79 | HR 76 | Temp 98.8°F | Resp 16 | Ht 63.0 in | Wt 158.8 lb

## 2013-06-09 DIAGNOSIS — M810 Age-related osteoporosis without current pathological fracture: Secondary | ICD-10-CM | POA: Insufficient documentation

## 2013-06-09 DIAGNOSIS — J309 Allergic rhinitis, unspecified: Secondary | ICD-10-CM | POA: Insufficient documentation

## 2013-06-09 DIAGNOSIS — Z139 Encounter for screening, unspecified: Secondary | ICD-10-CM

## 2013-06-09 DIAGNOSIS — N926 Irregular menstruation, unspecified: Secondary | ICD-10-CM | POA: Insufficient documentation

## 2013-06-09 DIAGNOSIS — R635 Abnormal weight gain: Secondary | ICD-10-CM

## 2013-06-09 DIAGNOSIS — R209 Unspecified disturbances of skin sensation: Secondary | ICD-10-CM | POA: Insufficient documentation

## 2013-06-09 DIAGNOSIS — Z9109 Other allergy status, other than to drugs and biological substances: Secondary | ICD-10-CM

## 2013-06-09 DIAGNOSIS — Z09 Encounter for follow-up examination after completed treatment for conditions other than malignant neoplasm: Secondary | ICD-10-CM | POA: Insufficient documentation

## 2013-06-09 DIAGNOSIS — N920 Excessive and frequent menstruation with regular cycle: Secondary | ICD-10-CM | POA: Insufficient documentation

## 2013-06-09 LAB — CBC WITH DIFFERENTIAL/PLATELET
Basophils Absolute: 0 10*3/uL (ref 0.0–0.1)
Basophils Relative: 0 % (ref 0–1)
Eosinophils Absolute: 0.3 10*3/uL (ref 0.0–0.7)
Eosinophils Relative: 4 % (ref 0–5)
HCT: 43.1 % (ref 36.0–46.0)
Hemoglobin: 14.6 g/dL (ref 12.0–15.0)
LYMPHS ABS: 2 10*3/uL (ref 0.7–4.0)
LYMPHS PCT: 27 % (ref 12–46)
MCH: 30.4 pg (ref 26.0–34.0)
MCHC: 33.9 g/dL (ref 30.0–36.0)
MCV: 89.6 fL (ref 78.0–100.0)
Monocytes Absolute: 0.5 10*3/uL (ref 0.1–1.0)
Monocytes Relative: 7 % (ref 3–12)
NEUTROS ABS: 4.5 10*3/uL (ref 1.7–7.7)
NEUTROS PCT: 62 % (ref 43–77)
PLATELETS: 288 10*3/uL (ref 150–400)
RBC: 4.81 MIL/uL (ref 3.87–5.11)
RDW: 12.9 % (ref 11.5–15.5)
WBC: 7.3 10*3/uL (ref 4.0–10.5)

## 2013-06-09 LAB — COMPLETE METABOLIC PANEL WITH GFR
ALK PHOS: 61 U/L (ref 39–117)
ALT: 15 U/L (ref 0–35)
AST: 21 U/L (ref 0–37)
Albumin: 4.4 g/dL (ref 3.5–5.2)
BUN: 9 mg/dL (ref 6–23)
CO2: 29 mEq/L (ref 19–32)
Calcium: 9.8 mg/dL (ref 8.4–10.5)
Chloride: 103 mEq/L (ref 96–112)
Creat: 0.57 mg/dL (ref 0.50–1.10)
GFR, Est African American: >89 mL/min
Glucose, Bld: 85 mg/dL (ref 70–99)
POTASSIUM: 4.7 meq/L (ref 3.5–5.3)
SODIUM: 139 meq/L (ref 135–145)
TOTAL PROTEIN: 7.1 g/dL (ref 6.0–8.3)
Total Bilirubin: 0.5 mg/dL (ref 0.2–1.2)

## 2013-06-09 MED ORDER — FLUTICASONE PROPIONATE 50 MCG/ACT NA SUSP: 2.0000 | Freq: Every day | NASAL | Status: DC

## 2013-06-09 NOTE — Progress Notes (Signed)
MRN: [id number] Name: Lacey Sanders  Sex: female Age: 28 y.o. DOB: 1985/11/29  Allergies: Review of patient's allergies indicates no known allergies.  Chief Complaint  Patient presents with  . Follow-up    HPI: Patient is 28 y.o. female who comes today reported to have lot of environmental allergies patient is taking Zyrtec, complained of lot of stuff nose runny nose sneezing in the morning denies any sore throat chest pain shortness of breath, patient also reported to have gained weight 15 pounds in the last one year, she reported to have family history of hypothyroidism, she also reported to have irregular menstrual periods sometimes heavy, patient denies any nausea vomiting or any change in bowel habits.  History reviewed. No pertinent past medical history.  History reviewed. No pertinent past surgical history.    Medication List       This list is accurate as of: 06/09/13 12:57 PM.  Always use your most recent med list.               dicyclomine 10 MG capsule  Commonly known as:  BENTYL  Take 1 capsule (10 mg total) by mouth 2 (two) times daily as needed.     fluticasone 50 MCG/ACT nasal spray  Commonly known as:  FLONASE  Place 2 sprays into both nostrils daily.     metroNIDAZOLE 500 MG tablet  Commonly known as:  FLAGYL  Take 500 mg by mouth 3 (three) times daily.     ranitidine 150 MG tablet  Commonly known as:  ZANTAC  Take 1 tablet (150 mg total) by mouth daily as needed for heartburn.        Meds ordered this encounter  Medications  . fluticasone (FLONASE) 50 MCG/ACT nasal spray    Sig: Place 2 sprays into both nostrils daily.    Dispense:  16 g    Refill:  3    Immunization History  Administered Date(s) Administered  . Influenza Split 11/05/2012    History reviewed. No pertinent family history.  History  Substance Use Topics  . Smoking status: Never Smoker   . Smokeless tobacco: Not on file  . Alcohol Use: No    Review  of Systems   As noted in HPI  Filed Vitals:   06/09/13 1237  BP: 123/79  Pulse: 76  Temp: 98.8 F (37.1 C)  Resp: 16    Physical Exam  Physical Exam  Constitutional: No distress.  HENT:  Nasal congestion ?polyp no sinus tenderness  Eyes: EOM are normal. Pupils are equal, round, and reactive to light.  Cardiovascular: Normal rate and regular rhythm.   Pulmonary/Chest: Breath sounds normal. No respiratory distress. She has no wheezes. She has no rales.    CBC    Component Value Date/Time   WBC 8.6 08/27/2012 1350   RBC 4.77 08/27/2012 1350   HGB 14.7 08/27/2012 1350   HCT 41.8 08/27/2012 1350   PLT 313 08/27/2012 1350   MCV 87.6 08/27/2012 1350   LYMPHSABS TEST NOT PERFORMED 08/19/2012 1703   MONOABS TEST NOT PERFORMED 08/19/2012 1703   EOSABS TEST NOT PERFORMED 08/19/2012 1703   BASOSABS TEST NOT PERFORMED 08/19/2012 1703    CMP     Component Value Date/Time   NA 140 08/19/2012 1703   K 3.7 08/19/2012 1703   CL 102 08/19/2012 1703   CO2 31 08/19/2012 1703   GLUCOSE 76 08/19/2012 1703   BUN 10 08/19/2012 1703   CREATININE 0.67 08/19/2012 1703  CREATININE 0.37* 08/30/2009 2135   CALCIUM 9.5 08/19/2012 1703   PROT 7.5 08/19/2012 1703   ALBUMIN 4.6 08/19/2012 1703   AST 20 08/19/2012 1703   ALT 19 08/19/2012 1703   ALKPHOS 49 08/19/2012 1703   BILITOT 0.3 08/19/2012 1703   GFRNONAA >60 08/30/2009 2135   GFRAA  Value: >60        The eGFR has been calculated using the MDRD equation. This calculation has not been validated in all clinical situations. eGFR's persistently <60 mL/min signify possible Chronic Kidney Disease. 08/30/2009 2135    No results found for this basename: chol, tri, ldl    No components found with this basename: hga1c    Lab Results  Component Value Date/Time   AST 20 08/19/2012  5:03 PM    Assessment and Plan  Environmental allergies Patient takes OTC Zyrtec.  Screening - Plan: Baseline blood work COMPLETE METABOLIC PANEL WITH GFR, Vit D  25 hydroxy (rtn osteoporosis  monitoring),   Patient also reported to have occasional numbness tingling in on the legs, will check Vitamin B12 level  Weight gain - Plan: Will check her TSH level  Irregular menstrual cycle/menorrhagia - Plan: CBC with Differential  Allergic rhinitis - Plan: fluticasone (FLONASE) 50 MCG/ACT nasal spray  Return in about 4 months (around 10/09/2013).  Lorayne Marek, MD

## 2013-06-09 NOTE — Progress Notes (Signed)
Patient here with interpreter Is here for her allergies but states she was seen at the health dept And they suggested we check her thyroid She has gained 30 plus pounds in the last year and a half

## 2013-06-10 ENCOUNTER — Telehealth: Payer: Self-pay | Admitting: *Deleted

## 2013-06-10 DIAGNOSIS — Z9109 Other allergy status, other than to drugs and biological substances: Secondary | ICD-10-CM | POA: Insufficient documentation

## 2013-06-10 DIAGNOSIS — N926 Irregular menstruation, unspecified: Secondary | ICD-10-CM | POA: Insufficient documentation

## 2013-06-10 DIAGNOSIS — R635 Abnormal weight gain: Secondary | ICD-10-CM | POA: Insufficient documentation

## 2013-06-10 DIAGNOSIS — J309 Allergic rhinitis, unspecified: Secondary | ICD-10-CM | POA: Insufficient documentation

## 2013-06-10 LAB — TSH: TSH: 1.784 u[IU]/mL (ref 0.350–4.500)

## 2013-06-10 LAB — VITAMIN B12: Vitamin B-12: 453 pg/mL (ref 211–911)

## 2013-06-10 LAB — VITAMIN D 25 HYDROXY (VIT D DEFICIENCY, FRACTURES): Vit D, 25-Hydroxy: 35 ng/mL (ref 30–89)

## 2013-06-10 NOTE — Telephone Encounter (Signed)
Pt is aware of her lab results.  

## 2013-06-10 NOTE — Telephone Encounter (Signed)
Message copied by Raynelle CharyWINFREE, Sheniece Ruggles R on Fri Jun 10, 2013 11:31 AM ------      Message from: Doris CheadleADVANI, DEEPAK      Created: Fri Jun 10, 2013  9:20 AM       Call and let the patient know that her blood work is normal. ------

## 2013-08-26 ENCOUNTER — Encounter (HOSPITAL_COMMUNITY): Payer: Self-pay | Admitting: Emergency Medicine

## 2013-08-26 ENCOUNTER — Emergency Department (HOSPITAL_COMMUNITY)
Admission: EM | Admit: 2013-08-26 | Discharge: 2013-08-26 | Disposition: A | Discharge status: Home / Self Care | Payer: No Typology Code available for payment source | Source: Home / Self Care | Attending: Emergency Medicine | Admitting: Emergency Medicine

## 2013-08-26 DIAGNOSIS — T148 Other injury of unspecified body region: Secondary | ICD-10-CM

## 2013-08-26 DIAGNOSIS — R21 Rash and other nonspecific skin eruption: Secondary | ICD-10-CM

## 2013-08-26 DIAGNOSIS — W57XXXA Bitten or stung by nonvenomous insect and other nonvenomous arthropods, initial encounter: Secondary | ICD-10-CM

## 2013-08-26 MED ORDER — BETAMETHASONE DIPROPIONATE AUG 0.05 % EX CREA: TOPICAL_CREAM | Freq: Two times a day (BID) | CUTANEOUS | Status: DC

## 2013-08-26 NOTE — ED Notes (Signed)
Zack B, PA is in the room w/pt  Pt c/o intermittent rash on left arm/shoulder only Alert w/no signs of acute distress.

## 2013-08-26 NOTE — Discharge Instructions (Signed)
Picadura de insectos  Surveyor, minerals)  Los mosquitos, las moscas, las [place], las chinches y muchos otros insectos pueden morder. Las picaduras de insectos son diferentes si tienen aguijn. El aguijn inyecta un veneno en la piel. Algunos insectos pueden transmitir enfermedades infecciosas con las picaduras.  SNTOMAS  La picadura generalmente se pone roja, se hincha y pica durante 2 a 4 das. Generalmente desaparecen por s mismas.  TRATAMIENTO  El mdico indicar antibiticos si aparece una infeccin bacteriana en la zona de la picadura.  INSTRUCCIONES PARA EL CUIDADO EN EL HOGAR   No rasque la zona.  Mantenga la zona limpia y seca. Lave la herida suavemente con [place].  Aplquese hielo o compresas fras.  Ponga el hielo en una bolsa plstica.  Colquese una toalla entre la piel y la bolsa de hielo.  Deje la bolsa de hielo durante 20 minutos 4 veces por da, durante los primeros 2  3 das, o segn las indicaciones.  Puede aplicar una pasta con bicarbonato de sodio, una crema con corticoides, una locin de calamina, segn las indicaciones del mdico. Esto ayuda a reducir la picazn y la hinchazn.  Tome slo medicamentos de venta libre o recetados, segn las indicaciones del mdico.  Si le han recetado antibiticos, tmelos segn las indicaciones. Tmelos todos, aunque se sienta mejor. Deber aplicarse la vacuna contra el ttanos si:  No recuerda cundo se coloc la vacuna la ltima vez.  Nunca recibi esta vacuna.  La lesin ha [employer]. Si le han aplicado la vacuna contra el ttanos, el brazo podr hincharse, enrojecer y sentirse caliente al tacto. Esto es frecuente y no es un problema. Si usted necesita aplicarse la vacuna y se niega a recibirla, corre riesgo de contraer ttanos. sta es una enfermedad grave.  SOLICITE ATENCIN MDICA DE INMEDIATO SI:   Siente un dolor cada vez ms intenso y observa enrojecimiento e hinchazn en la herida.  Hay una lnea roja en  la piel, cercana a la zona de la picadura.  Tiene fiebre.  Siente dolor en la articulacin.  Siente dolor de cabeza intenso o dolor en el cuello.  Siente debilidad muscular no habitual.  Tiene una erupcin.  Siente falta de aire o Journalist, newspaper.  Tiene dolor abdominal, nuseas o vmitos.  Se siente muy cansado o confundido. EST SEGURO QUE:   Comprende las instrucciones para el alta mdica.  Controlar su enfermedad.  Solicitar atencin mdica de inmediato segn las indicaciones. Document Released: 02/03/2005 Document Revised: 04/28/2011 [facility] Patient Information 2015 [place], [place]. This information is not intended to replace advice given to you by your health care provider. Make sure you discuss any questions you have with your health care provider.  Erupcin cutnea (Rash)  Una erupcin es un cambio en el color o en la textura de la piel. Hay diferentes tipos de erupcin. Puede ser que tenga otros sntomas que acompaan la erupcin.  CAUSAS   Infecciones.  Reacciones alrgicas. Esto incluye alergias a mascotas o a medicamentos.  Ciertos medicamentos.  Exposicin a ciertas sustancias qumicas, jabones o cosmticos.  El calor.  Exposicin a plantas venenosas.  Tumores, tanto cancerosos como no cancerosos. SNTOMAS   Enrojecimiento.  Piel escamosa.  Picazn en la piel.  Piel seca o agrietada.  Bultos.  Ampollas.  Dolor. DIAGNSTICO  El mdico har un examen fsico para determinar qu tipo de erupcin tiene. Podrn tomarle una muestra de piel (biopsia) para ser examinada en el microscopio.  TRATAMIENTO  El [place]  depende del tipo de erupcin que usted tenga. El mdico puede prescribirle algunos medicamentos. En los casos graves, Pension scheme managernecesitar ver a un mdico Engineer, productionespecialista en piel (dermatlogo).  INSTRUCCIONES PARA EL CUIDADO DOMICILIARIO  Evite las sustancias que han causado la erupcin.  No se rasque la lesin. Puede ocasionarle una  infeccin.  Tome baos con agua fresca para Psychologist, sport and exercisedetener la picazn.  Tome slo medicamentos de venta libre o recetados, segn las indicaciones del mdico.  Cumpla con todas las visitas de control, segn le indique su mdico. SOLICITE ATENCIN MDICA DE INMEDIATO SI:  El dolor, la hinchazn o el enrojecimiento [place].  Tiene fiebre.  Tiene sntomas nuevos o graves.  Siente dolor en el cuerpo, diarrea o vmitos.  La erupcin no mejora en el trmino de 3 das. ASEGRESE DE QUE:   Comprende estas instrucciones.  Controlar su enfermedad.  Solicitar ayuda de inmediato si no mejora o si empeora. Document Released: 11/13/2004 Document Revised: 10/29/2011 [facility] Patient Information 2015 [place], [place]. This information is not intended to replace advice given to you by your health care provider. Make sure you discuss any questions you have with your health care provider.

## 2013-08-26 NOTE — ED Provider Notes (Signed)
CSN: [id number]     Arrival date & time 08/26/13  1950 History   First MD Initiated Contact with Patient 08/26/13 2046     Chief Complaint  Patient presents with  . Rash   (Consider location/radiation/quality/duration/timing/severity/associated sxs/prior Treatment) HPI Comments: 28 year old female presents complaining of an intermittent rash on her left arm for 2 months. This started as a small spot at the base of her wrist that was red, raised, and itchy. It has since spread up the arm and onto the back of the shoulder, and she also has a spot on her right wrist. This rash waxes and wanes. She has tried over-the-counter treatments without relief of her symptoms. She denies any systemic symptoms or any significant past medical history. She has never had this before      History reviewed. No pertinent past medical history. Past Surgical History  Procedure Laterality Date  . Cesarean section     No family history on file. History  Substance Use Topics  . Smoking status: Never Smoker   . Smokeless tobacco: Not on file  . Alcohol Use: No   OB History   Grav Para Term Preterm Abortions TAB SAB Ect Mult Living                 Review of Systems  Skin: Positive for rash.  All other systems reviewed and are negative.   Allergies  Review of patient's allergies indicates no known allergies.  Home Medications   Prior to Admission medications   Medication Sig Start Date End Date Taking? Authorizing Provider  augmented betamethasone dipropionate (DIPROLENE AF) 0.05 % cream Apply topically 2 (two) times daily. 08/26/13   Graylon Good, PA-C  dicyclomine (BENTYL) 10 MG capsule Take 1 capsule (10 mg total) by mouth 2 (two) times daily as needed. 01/04/13   Nishant Dhungel, MD  fluticasone (FLONASE) 50 MCG/ACT nasal spray Place 2 sprays into both nostrils daily. 06/09/13   Doris Cheadle, MD  metroNIDAZOLE (FLAGYL) 500 MG tablet Take 500 mg by mouth 3 (three) times daily.    Historical  Provider, MD  ranitidine (ZANTAC) 150 MG tablet Take 1 tablet (150 mg total) by mouth daily as needed for heartburn. 09/23/12   Ripudeep Jenna Luo, MD   BP 112/63  Pulse 63  Temp(Src) 97.5 F (36.4 C) (Oral)  Resp 12  SpO2 100% Physical Exam  Nursing note and vitals reviewed. Constitutional: She is oriented to person, place, and time. Vital signs are normal. She appears well-developed and well-nourished. No distress.  HENT:  Head: Normocephalic and atraumatic.  Pulmonary/Chest: Effort normal. No respiratory distress.  Neurological: She is alert and oriented to person, place, and time. She has normal strength. Coordination normal.  Skin: Skin is warm and dry. Rash noted. Rash is papular (discrete erythematous papules on the left arm up to the shoulder, and one on the right wrist). She is not diaphoretic.  Psychiatric: She has a normal mood and affect. Judgment normal.    ED Course  Procedures (including critical care time) Labs Review Labs Reviewed - No data to display  Imaging Review No results found.   MDM   1. Rash   2. Insect bite    consistent with insect bites  Diprolene AF, referred out to allergist at her request  Followup when necessary  Meds ordered this encounter  Medications  . augmented betamethasone dipropionate (DIPROLENE AF) 0.05 % cream    Sig: Apply topically 2 (two) times daily.    Dispense:  50 g    Refill:  1    Order Specific Question:  Supervising Provider    Answer:  Lorenz CoasterKELLER, DAVID C [[id number]]     Graylon GoodZachary H Joshawa Dubin, PA-C 08/28/13 2015

## 2013-08-30 NOTE — ED Provider Notes (Signed)
Medical screening examination/treatment/procedure(s) were performed by non-physician practitioner and as supervising physician I was immediately available for consultation/collaboration.  Kaylea Mounsey, M.D.  Kaytelyn Glore C Fern Canova, MD 08/30/13 1439 

## 2013-09-19 ENCOUNTER — Encounter: Payer: Self-pay | Admitting: Internal Medicine

## 2013-09-19 ENCOUNTER — Ambulatory Visit: Payer: No Typology Code available for payment source | Attending: Internal Medicine | Admitting: Internal Medicine

## 2013-09-19 VITALS — BP 114/76 | HR 72 | Temp 98.2°F | Resp 16 | Wt 154.0 lb

## 2013-09-19 DIAGNOSIS — N926 Irregular menstruation, unspecified: Secondary | ICD-10-CM | POA: Insufficient documentation

## 2013-09-19 DIAGNOSIS — Z9109 Other allergy status, other than to drugs and biological substances: Secondary | ICD-10-CM

## 2013-09-19 DIAGNOSIS — J309 Allergic rhinitis, unspecified: Secondary | ICD-10-CM | POA: Insufficient documentation

## 2013-09-19 NOTE — Progress Notes (Signed)
Interpreter line used Patient states here for follow up on her allergies and Irregular menstrual cycles

## 2013-09-19 NOTE — Progress Notes (Signed)
MRN: [id number] Name: Lacey Sanders  Sex: female Age: 28 y.o. DOB: 04-01-1985  Allergies: Review of patient's allergies indicates no known allergies.  Chief Complaint  Patient presents with  . Follow-up    HPI: Patient is 28 y.o. female who comes today for followup, recently had a blood work done which was reviewed with the patient, she denies any acute symptoms has history of allergic rhinitis and involvement allergies currently taking Zyprexa as well as using Flonase and reports improvement in the symptoms also has history of irregular menstrual periods denies any menorrhagia as per patient she is up-to-date with her Pap smear.  History reviewed. No pertinent past medical history.  Past Surgical History  Procedure Laterality Date  . Cesarean section        Medication List       This list is accurate as of: 09/19/13 10:38 AM.  Always use your most recent med list.               augmented betamethasone dipropionate 0.05 % cream  Commonly known as:  DIPROLENE AF  Apply topically 2 (two) times daily.     dicyclomine 10 MG capsule  Commonly known as:  BENTYL  Take 1 capsule (10 mg total) by mouth 2 (two) times daily as needed.     fluticasone 50 MCG/ACT nasal spray  Commonly known as:  FLONASE  Place 2 sprays into both nostrils daily.     metroNIDAZOLE 500 MG tablet  Commonly known as:  FLAGYL  Take 500 mg by mouth 3 (three) times daily.     ranitidine 150 MG tablet  Commonly known as:  ZANTAC  Take 1 tablet (150 mg total) by mouth daily as needed for heartburn.        No orders of the defined types were placed in this encounter.    Immunization History  Administered Date(s) Administered  . Influenza Split 11/05/2012    History reviewed. No pertinent family history.  History  Substance Use Topics  . Smoking status: Never Smoker   . Smokeless tobacco: Not on file  . Alcohol Use: No    Review of Systems   As noted in HPI  Filed  Vitals:   09/19/13 1027  BP: 114/76  Pulse: 72  Temp: 98.2 F (36.8 C)  Resp: 16    Physical Exam  Physical Exam  HENT:  Minimal nasal congestion no sinus tenderness   Eyes: EOM are normal. Pupils are equal, round, and reactive to light.  Cardiovascular: Normal rate and regular rhythm.   Pulmonary/Chest: Breath sounds normal. No respiratory distress. She has no wheezes. She has no rales.  Musculoskeletal: She exhibits no edema.    CBC    Component Value Date/Time   WBC 7.3 06/09/2013 1301   RBC 4.81 06/09/2013 1301   HGB 14.6 06/09/2013 1301   HCT 43.1 06/09/2013 1301   PLT 288 06/09/2013 1301   MCV 89.6 06/09/2013 1301   LYMPHSABS 2.0 06/09/2013 1301   MONOABS 0.5 06/09/2013 1301   EOSABS 0.3 06/09/2013 1301   BASOSABS 0.0 06/09/2013 1301    CMP     Component Value Date/Time   NA 139 06/09/2013 1301   K 4.7 06/09/2013 1301   CL 103 06/09/2013 1301   CO2 29 06/09/2013 1301   GLUCOSE 85 06/09/2013 1301   BUN 9 06/09/2013 1301   CREATININE 0.57 06/09/2013 1301   CREATININE 0.37* 08/30/2009 2135   CALCIUM 9.8 06/09/2013 1301   PROT 7.1  06/09/2013 1301   ALBUMIN 4.4 06/09/2013 1301   AST 21 06/09/2013 1301   ALT 15 06/09/2013 1301   ALKPHOS 61 06/09/2013 1301   BILITOT 0.5 06/09/2013 1301   GFRNONAA >89 06/09/2013 1301   GFRNONAA >60 08/30/2009 2135   GFRAA >89 06/09/2013 1301   GFRAA  Value: >60        The eGFR has been calculated using the MDRD equation. This calculation has not been validated in all clinical situations. eGFR's persistently <60 mL/min signify possible Chronic Kidney Disease. 08/30/2009 2135    No results found for this basename: chol, tri, ldl    No components found with this basename: hga1c    Lab Results  Component Value Date/Time   AST 21 06/09/2013  1:01 PM    Assessment and Plan  Environmental allergies Symptomatically improved takes cetirizine when necessary.  Allergic rhinitis, unspecified allergic rhinitis type Symptomatically improved continue  with Flonase  Irregular menstrual cycle No symptoms of menorrhagia her CBC shows normal blood count, she's not anemic,, her TSH level is normal as per patient she is up-to-date with her Pap smear.  Return in about 6 months (around 03/22/2014), or if symptoms worsen or fail to improve, for allergic rhinitis.  Lorayne Marek, MD

## 2013-10-17 ENCOUNTER — Ambulatory Visit: Payer: No Typology Code available for payment source | Attending: Internal Medicine

## 2013-11-11 ENCOUNTER — Telehealth: Payer: Self-pay | Admitting: Internal Medicine

## 2013-11-11 NOTE — Telephone Encounter (Signed)
Pt. Came into facility stating that her throat is sore, and it's painful. Pt. Also states that she had some blood in her spit.  Pt. Would like some medication to treat and some advice. Please f/u with pt.

## 2013-12-05 ENCOUNTER — Ambulatory Visit: Payer: No Typology Code available for payment source | Attending: Internal Medicine

## 2013-12-05 DIAGNOSIS — Z23 Encounter for immunization: Secondary | ICD-10-CM

## 2013-12-05 DIAGNOSIS — R300 Dysuria: Secondary | ICD-10-CM

## 2013-12-05 LAB — POCT URINE PREGNANCY: PREG TEST UR: NEGATIVE

## 2013-12-12 ENCOUNTER — Encounter: Payer: Self-pay | Admitting: Internal Medicine

## 2013-12-12 ENCOUNTER — Ambulatory Visit: Payer: No Typology Code available for payment source | Attending: Internal Medicine | Admitting: Internal Medicine

## 2013-12-12 VITALS — BP 118/72 | HR 68 | Temp 98.0°F | Resp 16 | Wt 162.8 lb

## 2013-12-12 DIAGNOSIS — N644 Mastodynia: Secondary | ICD-10-CM

## 2013-12-12 DIAGNOSIS — N898 Other specified noninflammatory disorders of vagina: Secondary | ICD-10-CM

## 2013-12-12 DIAGNOSIS — K219 Gastro-esophageal reflux disease without esophagitis: Secondary | ICD-10-CM

## 2013-12-12 LAB — POCT URINALYSIS DIPSTICK
BILIRUBIN UA: NEGATIVE
Glucose, UA: NEGATIVE
KETONES UA: NEGATIVE
NITRITE UA: NEGATIVE
PH UA: 7.5
Protein, UA: 30
Spec Grav, UA: 1.015
Urobilinogen, UA: 0.2

## 2013-12-12 MED ORDER — FLUCONAZOLE 150 MG PO TABS: 150.0000 mg | ORAL_TABLET | Freq: Once | ORAL | Status: DC

## 2013-12-12 MED ORDER — IBUPROFEN 600 MG PO TABS: 600.0000 mg | ORAL_TABLET | Freq: Three times a day (TID) | ORAL | Status: DC | PRN

## 2013-12-12 MED ORDER — RANITIDINE HCL 150 MG PO TABS: 150.0000 mg | ORAL_TABLET | Freq: Every day | ORAL | Status: DC | PRN

## 2013-12-12 MED ORDER — METRONIDAZOLE 500 MG PO TABS: 500.0000 mg | ORAL_TABLET | Freq: Two times a day (BID) | ORAL | Status: DC

## 2013-12-12 NOTE — Progress Notes (Signed)
MRN: [id number] Name: Lacey Sanders  Sex: female Age: 28 y.o. DOB: Jul 20, 1985  Allergies: Review of patient's allergies indicates no known allergies.  Chief Complaint  Patient presents with  . Vaginal Discharge    HPI: Patient is 28 y.o. female who comes today reported to have vaginal discharge last 3 weeks and feels vaginal itching, denies any dysuria, currently she is having menstrual periods her urine shows blood, she also reported to have bilateral breast pain which also started 3 days ago denies any fever chills any discharge from nipple. Patient also history of GERD and is having recurrence of the symptoms, in the past she was prescribed Zantac.  History reviewed. No pertinent past medical history.  Past Surgical History  Procedure Laterality Date  . Cesarean section        Medication List       This list is accurate as of: 12/12/13  4:37 PM.  Always use your most recent med list.               augmented betamethasone dipropionate 0.05 % cream  Commonly known as:  DIPROLENE AF  Apply topically 2 (two) times daily.     dicyclomine 10 MG capsule  Commonly known as:  BENTYL  Take 1 capsule (10 mg total) by mouth 2 (two) times daily as needed.     fluconazole 150 MG tablet  Commonly known as:  DIFLUCAN  Take 1 tablet (150 mg total) by mouth once.     fluticasone 50 MCG/ACT nasal spray  Commonly known as:  FLONASE  Place 2 sprays into both nostrils daily.     ibuprofen 600 MG tablet  Commonly known as:  ADVIL,MOTRIN  Take 1 tablet (600 mg total) by mouth every 8 (eight) hours as needed.     metroNIDAZOLE 500 MG tablet  Commonly known as:  FLAGYL  Take 1 tablet (500 mg total) by mouth 2 (two) times daily.     ranitidine 150 MG tablet  Commonly known as:  ZANTAC  Take 1 tablet (150 mg total) by mouth daily as needed for heartburn.        Meds ordered this encounter  Medications  . metroNIDAZOLE (FLAGYL) 500 MG tablet    Sig: Take 1  tablet (500 mg total) by mouth 2 (two) times daily.    Dispense:  14 tablet    Refill:  0  . ranitidine (ZANTAC) 150 MG tablet    Sig: Take 1 tablet (150 mg total) by mouth daily as needed for heartburn.    Dispense:  30 tablet    Refill:  3  . ibuprofen (ADVIL,MOTRIN) 600 MG tablet    Sig: Take 1 tablet (600 mg total) by mouth every 8 (eight) hours as needed.    Dispense:  30 tablet    Refill:  1  . fluconazole (DIFLUCAN) 150 MG tablet    Sig: Take 1 tablet (150 mg total) by mouth once.    Dispense:  1 tablet    Refill:  0    Immunization History  Administered Date(s) Administered  . Influenza Split 11/05/2012  . Influenza,inj,Quad PF,36+ Mos 12/05/2013    History reviewed. No pertinent family history.  History  Substance Use Topics  . Smoking status: Never Smoker   . Smokeless tobacco: Not on file  . Alcohol Use: No    Review of Systems   As noted in HPI  Filed Vitals:   12/12/13 1555  BP: 118/72  Pulse: 68  Temp: 98 F (36.7 C)  Resp: 16    Physical Exam  Physical Exam  Constitutional: No distress.  Eyes: EOM are normal. Pupils are equal, round, and reactive to light.  Neck: Neck supple.  Cardiovascular: Normal rate and regular rhythm.   Pulmonary/Chest: Breath sounds normal. She has no wheezes. She has no rales.  Breast examination done in the presence of medical staff as chaperone, no apparent the nipple discharge, no erythema but some tenderness bilaterally, no palpable nodes    CBC    Component Value Date/Time   WBC 7.3 06/09/2013 1301   RBC 4.81 06/09/2013 1301   HGB 14.6 06/09/2013 1301   HCT 43.1 06/09/2013 1301   PLT 288 06/09/2013 1301   MCV 89.6 06/09/2013 1301   LYMPHSABS 2.0 06/09/2013 1301   MONOABS 0.5 06/09/2013 1301   EOSABS 0.3 06/09/2013 1301   BASOSABS 0.0 06/09/2013 1301    CMP     Component Value Date/Time   NA 139 06/09/2013 1301   K 4.7 06/09/2013 1301   CL 103 06/09/2013 1301   CO2 29 06/09/2013 1301   GLUCOSE 85 06/09/2013  1301   BUN 9 06/09/2013 1301   CREATININE 0.57 06/09/2013 1301   CREATININE 0.37* 08/30/2009 2135   CALCIUM 9.8 06/09/2013 1301   PROT 7.1 06/09/2013 1301   ALBUMIN 4.4 06/09/2013 1301   AST 21 06/09/2013 1301   ALT 15 06/09/2013 1301   ALKPHOS 61 06/09/2013 1301   BILITOT 0.5 06/09/2013 1301   GFRNONAA >89 06/09/2013 1301   GFRNONAA >60 08/30/2009 2135   GFRAA >89 06/09/2013 1301   GFRAA  Value: >60        The eGFR has been calculated using the MDRD equation. This calculation has not been validated in all clinical situations. eGFR's persistently <60 mL/min signify possible Chronic Kidney Disease. 08/30/2009 2135    No results found for this basename: chol,  tri,  ldl    No components found with this basename: hga1c    Lab Results  Component Value Date/Time   AST 21 06/09/2013  1:01 PM    Assessment and Plan  Vaginal discharge - Plan:  Results for orders placed in visit on 12/12/13  POCT URINALYSIS DIPSTICK      Result Value Ref Range   Color, UA red     Clarity, UA cloudy     Glucose, UA neg     Bilirubin, UA neg     Ketones, UA neg     Spec Grav, UA 1.015     Blood, UA large     pH, UA 7.5     Protein, UA 30     Urobilinogen, UA 0.2     Nitrite, UA neg     Leukocytes, UA small (1+)     Urinalysis Dipstick shows blood likely secondary to menstrual periods ,  will treat her for vaginal discharge prescribed Flagyl and Diflucan metroNIDAZOLE (FLAGYL) 500 MG tablet, fluconazole (DIFLUCAN) 150 MG tablet  Pain of both breasts - Plan: Examination is benign, trial of ibuprofen (ADVIL,MOTRIN) 600 MG tablet if symptomatically worse consider referral to breast clinic.  Gastroesophageal reflux disease, esophagitis presence not specified - Plan: Lifestyle modification refill on ranitidine (ZANTAC) 150 MG tablet    Return in about 3 months (around 03/14/2014), or if symptoms worsen or fail to improve, for gerd.  ADVANI, DEEPAK, MD   

## 2013-12-12 NOTE — Progress Notes (Signed)
Patient complains of having a vaginal discharge on and off Also states having some pain to her left breast the past three days

## 2014-01-03 ENCOUNTER — Encounter: Payer: Self-pay | Admitting: Internal Medicine

## 2014-01-03 ENCOUNTER — Ambulatory Visit: Payer: No Typology Code available for payment source | Attending: Internal Medicine | Admitting: Internal Medicine

## 2014-01-03 VITALS — BP 122/80 | HR 77 | Temp 98.0°F | Resp 16 | Wt 166.4 lb

## 2014-01-03 DIAGNOSIS — J029 Acute pharyngitis, unspecified: Secondary | ICD-10-CM | POA: Insufficient documentation

## 2014-01-03 LAB — POCT RAPID STREP A (OFFICE): RAPID STREP A SCREEN: NEGATIVE

## 2014-01-03 MED ORDER — AMOXICILLIN-POT CLAVULANATE 875-125 MG PO TABS: 1.0000 | ORAL_TABLET | Freq: Two times a day (BID) | ORAL | Status: DC

## 2014-01-03 NOTE — Progress Notes (Signed)
Patient here with interpreter Complains of having a sore throat but was not able to get an appointment right away Still complains of having a slight sore throat Patient also states when she positions her arms the same for long periods of time her arms feel numb

## 2014-01-03 NOTE — Progress Notes (Signed)
  MRN: [id number] Name: Lacey Sanders  Sex: female Age: 28 y.o. DOB: 03/30/1985  Allergies: Review of patient's allergies indicates no known allergies.  Chief Complaint  Patient presents with  . Sore Throat    HPI: Patient is 28 y.o. female who comes to reported to have sore throat for the last 10 days as per patient her 7 days ago she took some amoxicillin for 2-3 days which she got it from her family member, reports improvement but still has persistent soreness sometimes she feels it in the ears, complaining of chills denies any fever currently on has occasional cough denies any shortness of breath.as per patient she also had some numbness tingling in her arms especially when she rest her arm and one position for long time, currently denies any numbness or weakness.  History reviewed. No pertinent past medical history.  Past Surgical History  Procedure Laterality Date  . Cesarean section        Medication List       This list is accurate as of: 01/03/14 12:50 PM.  Always use your most recent med list.               amoxicillin-clavulanate 875-125 MG per tablet  Commonly known as:  AUGMENTIN  Take 1 tablet by mouth 2 (two) times daily.     augmented betamethasone dipropionate 0.05 % cream  Commonly known as:  DIPROLENE AF  Apply topically 2 (two) times daily.     dicyclomine 10 MG capsule  Commonly known as:  BENTYL  Take 1 capsule (10 mg total) by mouth 2 (two) times daily as needed.     fluconazole 150 MG tablet  Commonly known as:  DIFLUCAN  Take 1 tablet (150 mg total) by mouth once.     fluticasone 50 MCG/ACT nasal spray  Commonly known as:  FLONASE  Place 2 sprays into both nostrils daily.     ibuprofen 600 MG tablet  Commonly known as:  ADVIL,MOTRIN  Take 1 tablet (600 mg total) by mouth every 8 (eight) hours as needed.     metroNIDAZOLE 500 MG tablet  Commonly known as:  FLAGYL  Take 1 tablet (500 mg total) by mouth 2 (two) times  daily.     ranitidine 150 MG tablet  Commonly known as:  ZANTAC  Take 1 tablet (150 mg total) by mouth daily as needed for heartburn.        Meds ordered this encounter  Medications  . amoxicillin-clavulanate (AUGMENTIN) 875-125 MG per tablet    Sig: Take 1 tablet by mouth 2 (two) times daily.    Dispense:  10 tablet    Refill:  0    Immunization History  Administered Date(s) Administered  . Influenza Split 11/05/2012  . Influenza,inj,Quad PF,36+ Mos 12/05/2013    History reviewed. No pertinent family history.  History  Substance Use Topics  . Smoking status: Never Smoker   . Smokeless tobacco: Not on file  . Alcohol Use: No    Review of Systems   As noted in HPI  Filed Vitals:   01/03/14 1219  BP: 122/80  Pulse: 77  Temp: 98 F (36.7 C)  Resp: 16    Physical Exam  Physical Exam  HENT:  Minimal nasal congestion no sinus tenderness, both TMs visualized no congested, minimal pharyngeal erythema no exudate   Eyes: EOM are normal. Pupils are equal, round, and reactive to light.  Neck: Neck supple.  Cardiovascular: Normal rate and regular rhythm.     Pulmonary/Chest: Breath sounds normal. No respiratory distress. She has no wheezes. She has no rales.  Musculoskeletal: She exhibits no edema.  Lymphadenopathy:    She has cervical adenopathy.    CBC    Component Value Date/Time   WBC 7.3 06/09/2013 1301   RBC 4.81 06/09/2013 1301   HGB 14.6 06/09/2013 1301   HCT 43.1 06/09/2013 1301   PLT 288 06/09/2013 1301   MCV 89.6 06/09/2013 1301   LYMPHSABS 2.0 06/09/2013 1301   MONOABS 0.5 06/09/2013 1301   EOSABS 0.3 06/09/2013 1301   BASOSABS 0.0 06/09/2013 1301    CMP     Component Value Date/Time   NA 139 06/09/2013 1301   K 4.7 06/09/2013 1301   CL 103 06/09/2013 1301   CO2 29 06/09/2013 1301   GLUCOSE 85 06/09/2013 1301   BUN 9 06/09/2013 1301   CREATININE 0.57 06/09/2013 1301   CREATININE 0.37* 08/30/2009 2135   CALCIUM 9.8 06/09/2013 1301    PROT 7.1 06/09/2013 1301   ALBUMIN 4.4 06/09/2013 1301   AST 21 06/09/2013 1301   ALT 15 06/09/2013 1301   ALKPHOS 61 06/09/2013 1301   BILITOT 0.5 06/09/2013 1301   GFRNONAA >89 06/09/2013 1301   GFRNONAA >60 08/30/2009 2135   GFRAA >89 06/09/2013 1301   GFRAA  08/30/2009 2135    >60        The eGFR has been calculated using the MDRD equation. This calculation has not been validated in all clinical situations. eGFR's persistently <60 mL/min signify possible Chronic Kidney Disease.    No results found for: CHOL  No components found for: HGA1C  Lab Results  Component Value Date/Time   AST 21 06/09/2013 01:01 PM    Assessment and Plan  Sore throat - Plan: Rapid Strep A is negative, will send Throat culture ([facility]), she took amoxicillin for 2-3 days and did not finish the course of antibiotic, I have prescribedamoxicillin-clavulanate (AUGMENTIN) 875-125 MG per tablet to take twice a day for 5 days, also advise patient for saltwater gargles., Ibuprofen when necessary for pain   Health Maintenance Patient is update with flu shot.  Return if symptoms worsen or fail to improve.  ADVANI, DEEPAK, MD   

## 2014-01-05 LAB — CULTURE, GROUP A STREP: ORGANISM ID, BACTERIA: NORMAL

## 2014-01-25 ENCOUNTER — Ambulatory Visit: Payer: No Typology Code available for payment source | Attending: Internal Medicine

## 2014-05-23 ENCOUNTER — Telehealth: Payer: Self-pay | Admitting: Internal Medicine

## 2014-05-23 NOTE — Telephone Encounter (Signed)
Pt would like to update her immunizations.  She was told that she has a missing vaccine but has question about which one/how many in the series. Please f/u with pt.

## 2014-05-29 ENCOUNTER — Other Ambulatory Visit: Payer: Self-pay | Admitting: Specialist

## 2014-05-29 ENCOUNTER — Ambulatory Visit
Admission: RE | Admit: 2014-05-29 | Discharge: 2014-05-29 | Disposition: A | Discharge status: Home / Self Care | Payer: No Typology Code available for payment source | Source: Ambulatory Visit | Attending: Specialist | Admitting: Specialist

## 2014-05-29 DIAGNOSIS — R7612 Nonspecific reaction to cell mediated immunity measurement of gamma interferon antigen response without active tuberculosis: Secondary | ICD-10-CM

## 2014-06-23 ENCOUNTER — Ambulatory Visit: Payer: No Typology Code available for payment source | Attending: Internal Medicine

## 2014-07-06 ENCOUNTER — Ambulatory Visit: Payer: No Typology Code available for payment source | Attending: Family Medicine | Admitting: Family Medicine

## 2014-07-06 ENCOUNTER — Other Ambulatory Visit (HOSPITAL_COMMUNITY)
Admission: RE | Admit: 2014-07-06 | Discharge: 2014-07-06 | Disposition: A | Discharge status: Home / Self Care | Payer: No Typology Code available for payment source | Source: Ambulatory Visit | Attending: Family Medicine | Admitting: Family Medicine

## 2014-07-06 ENCOUNTER — Encounter: Payer: Self-pay | Admitting: Family Medicine

## 2014-07-06 VITALS — BP 100/65 | HR 72 | Temp 98.2°F | Resp 16 | Wt 173.0 lb

## 2014-07-06 DIAGNOSIS — M545 Low back pain: Secondary | ICD-10-CM | POA: Insufficient documentation

## 2014-07-06 DIAGNOSIS — Z01419 Encounter for gynecological examination (general) (routine) without abnormal findings: Secondary | ICD-10-CM | POA: Insufficient documentation

## 2014-07-06 DIAGNOSIS — Z114 Encounter for screening for human immunodeficiency virus [HIV]: Secondary | ICD-10-CM

## 2014-07-06 DIAGNOSIS — R1031 Right lower quadrant pain: Secondary | ICD-10-CM | POA: Insufficient documentation

## 2014-07-06 DIAGNOSIS — N760 Acute vaginitis: Secondary | ICD-10-CM | POA: Insufficient documentation

## 2014-07-06 DIAGNOSIS — Z124 Encounter for screening for malignant neoplasm of cervix: Secondary | ICD-10-CM

## 2014-07-06 DIAGNOSIS — N926 Irregular menstruation, unspecified: Secondary | ICD-10-CM

## 2014-07-06 DIAGNOSIS — Z113 Encounter for screening for infections with a predominantly sexual mode of transmission: Secondary | ICD-10-CM | POA: Insufficient documentation

## 2014-07-06 LAB — POCT URINALYSIS DIPSTICK
Bilirubin, UA: NEGATIVE
Glucose, UA: NEGATIVE
Ketones, UA: NEGATIVE
Leukocytes, UA: NEGATIVE
Nitrite, UA: NEGATIVE
Protein, UA: 0.2
SPEC GRAV UA: 1.015
UROBILINOGEN UA: 0.2
pH, UA: 7.5

## 2014-07-06 LAB — HIV ANTIBODY (ROUTINE TESTING W REFLEX): HIV: NONREACTIVE

## 2014-07-06 LAB — POCT URINE PREGNANCY: Preg Test, Ur: NEGATIVE

## 2014-07-06 MED ORDER — NAPROXEN 500 MG PO TABS: 500.0000 mg | ORAL_TABLET | Freq: Two times a day (BID) | ORAL | Status: DC

## 2014-07-06 NOTE — Assessment & Plan Note (Signed)
Screening HIV ordered  

## 2014-07-06 NOTE — Assessment & Plan Note (Signed)
A; mild RLQ suspect pain with ovulation vs small stone, UA with trace blood only P:  NSAID Water PCP f/u if pain worsens or persist

## 2014-07-06 NOTE — Progress Notes (Signed)
Annual pap Complaining of irregular menses  Stated need referral OBGYN

## 2014-07-06 NOTE — Patient Instructions (Addendum)
Ms. Lacey Sanders,  Pap done today You will be called with results.  There are home ovulation kits you can use to check fertile time of the month when periods are irregular  RLQ pain: naproxen. Water.   F/u with Dr. Orpah CobbAdvani for concerns about your menstrual periods  Dr. Armen PickupFunches

## 2014-07-06 NOTE — Assessment & Plan Note (Signed)
Pap done

## 2014-07-06 NOTE — Progress Notes (Signed)
   Subjective:    Patient ID: Lacey Sanders, female    DOB: January 06, 1986, 29 y.o.   MRN: [id number] PCP: Dr. Orpah CobbAdvani  CC: pap smear  HPI  1. Screening pap:  1 normal [facility] in the [facility]. Hx of BV in past. Irregular menses.   2. RLQ pain: x 2 weeks. Colicky and crampy. Taking naproxen which helps intermittently. Has low back pain. No flank pain, nausea, emsis, fever or chills.   Soc hx: non smoker  Review of Systems  Constitutional: Negative for fever and chills.  Gastrointestinal: Positive for abdominal pain. Negative for nausea, vomiting, diarrhea and constipation.      Objective:   Physical Exam BP 100/65 mmHg  Pulse 72  Temp(Src) 98.2 F (36.8 C) (Oral)  Resp 16  Wt 173 lb (78.472 kg)  SpO2 99%  LMP 06/18/2014 General appearance: alert, cooperative and no distress Abdomen: flat, soft, no masses, mild TTP RLQ, w/o rebound and guarding  Pelvic: cervix normal in appearance, external genitalia normal, no adnexal masses or tenderness, no cervical motion tenderness, rectovaginal septum normal, uterus normal size, shape, and consistency and vagina normal without discharge  UA: trace blood otherwise negative  U preg: negative     Assessment & Plan:

## 2014-07-07 LAB — CYTOLOGY - PAP

## 2014-07-07 LAB — CERVICOVAGINAL ANCILLARY ONLY
Chlamydia: NEGATIVE
Neisseria Gonorrhea: NEGATIVE
WET PREP (BD AFFIRM): NEGATIVE

## 2014-07-18 ENCOUNTER — Telehealth: Payer: Self-pay | Admitting: *Deleted

## 2014-07-18 NOTE — Telephone Encounter (Signed)
-----   Message from Dessa PhiJosalyn Funches, MD sent at 07/07/2014  1:37 PM EDT ----- Gc/chlam negative Pap normal, repeat in 3 years

## 2014-07-18 NOTE — Telephone Encounter (Signed)
-----   Message from Dessa PhiJosalyn Funches, MD sent at 07/07/2014  8:48 AM EDT ----- Screening HIV negative

## 2014-07-18 NOTE — Telephone Encounter (Signed)
-----   Message from Dessa PhiJosalyn Funches, MD sent at 07/07/2014  4:33 PM EDT ----- Negative wet prep

## 2014-07-18 NOTE — Telephone Encounter (Signed)
Pt aware of results Results given in Spanish 

## 2014-07-20 ENCOUNTER — Ambulatory Visit: Payer: No Typology Code available for payment source | Admitting: Family Medicine

## 2014-07-24 ENCOUNTER — Ambulatory Visit: Payer: No Typology Code available for payment source | Admitting: Internal Medicine

## 2015-02-18 NOTE — L&D Delivery Note (Signed)
Delivery Note At 11:18 PM a viable female was delivered via VBAC, Spontaneous (Presentation: vertex; OA).  APGAR: 2, 8; weight pending.   Placenta status: spontaneous, intact.  Cord: 3 vessel with the following complications: tight nuchal, delivered through.  Cord pH: 7.209  Anesthesia:   Episiotomy: None Lacerations: 1st degree;Perineal Suture Repair: 3.0 vicryl Est. Blood Loss (mL):  350  Mom to postpartum.  Baby to Couplet care / Skin to Skin.  Lacey Sanders 12/01/2015, 12:06 AM  Patient is a G2P1001 at 8093w0d who was admitted w/ SOL/SROM, significant hx of prev C/S desiring VBAC.  She progressed with augmentation via Pit.  I was gloved and present for delivery in its entirety.  Second stage of labor progressed to SVD.  Few decels during second stage noted.  Complications: peds called for infant, who after some initial cry attempts needed more attention  Lacerations: 1st degree perineal  EBL: 350  SHAW, KIMBERLY, CNM 2:17 AM 12/01/2015

## 2015-05-24 ENCOUNTER — Encounter: Payer: Self-pay | Admitting: Family

## 2015-05-24 ENCOUNTER — Ambulatory Visit (INDEPENDENT_AMBULATORY_CARE_PROVIDER_SITE_OTHER): Payer: Self-pay | Admitting: Family

## 2015-05-24 VITALS — BP 128/66 | HR 118 | Temp 98.5°F | Wt 185.0 lb

## 2015-05-24 DIAGNOSIS — Z3481 Encounter for supervision of other normal pregnancy, first trimester: Secondary | ICD-10-CM

## 2015-05-24 DIAGNOSIS — O0990 Supervision of high risk pregnancy, unspecified, unspecified trimester: Secondary | ICD-10-CM

## 2015-05-24 DIAGNOSIS — O34219 Maternal care for unspecified type scar from previous cesarean delivery: Secondary | ICD-10-CM | POA: Insufficient documentation

## 2015-05-24 DIAGNOSIS — Z3490 Encounter for supervision of normal pregnancy, unspecified, unspecified trimester: Secondary | ICD-10-CM | POA: Insufficient documentation

## 2015-05-24 NOTE — Patient Instructions (Signed)
Segundo trimestre de embarazo (Second Trimester of Pregnancy) El segundo trimestre va desde la semana13 hasta la 28, desde el cuarto hasta el sexto mes, y suele ser el momento en el que mejor se siente. Su organismo se ha adaptado a estar embarazada y comienza a sentirse fsicamente mejor. En general, las nuseas matutinas han disminuido o han desaparecido completamente, puede tener ms energa y un aumento de apetito. El segundo trimestre es tambin la poca en la que el feto se desarrolla rpidamente. Hacia el final del sexto mes, el feto mide aproximadamente 9pulgadas (23cm) y pesa alrededor de 1 libras (700g). Es probable que sienta que el beb se mueve (da pataditas) entre las 18 y 20semanas del embarazo. CAMBIOS EN EL ORGANISMO Su organismo atraviesa por muchos cambios durante el embarazo, y estos varan de una mujer a otra.   Seguir aumentando de peso. Notar que la parte baja del abdomen sobresale.  Podrn aparecer las primeras estras en las caderas, el abdomen y las mamas.  Es posible que tenga dolores de cabeza que pueden aliviarse con los medicamentos que el mdico le permita tomar.  Tal vez tenga necesidad de orinar con ms frecuencia porque el feto est ejerciendo presin sobre la vejiga.  Debido al embarazo podr sentir acidez estomacal con frecuencia.  Puede estar estreida, ya que ciertas hormonas enlentecen los movimientos de los msculos que empujan los desechos a travs de los intestinos.  Pueden aparecer hemorroides o abultarse e hincharse las venas (venas varicosas).  Puede tener dolor de espalda que se debe al aumento de peso y a que las hormonas del embarazo relajan las articulaciones entre los huesos de la pelvis, y como consecuencia de la modificacin del peso y los msculos que mantienen el equilibrio.  Las mamas seguirn creciendo y le dolern.  Las encas pueden sangrar y estar sensibles al cepillado y al hilo dental.  Pueden aparecer zonas oscuras o  manchas (cloasma, mscara del embarazo) en el rostro que probablemente se atenuar despus del nacimiento del beb.  Es posible que se forme una lnea oscura desde el ombligo hasta la zona del pubis (linea nigra) que probablemente se atenuar despus del nacimiento del beb.  Tal vez haya cambios en el cabello que pueden incluir su engrosamiento, crecimiento rpido y cambios en la textura. Adems, a algunas mujeres se les cae el cabello durante o despus del embarazo, o tienen el cabello seco o fino. Lo ms probable es que el cabello se le normalice despus del nacimiento del beb. QU DEBE ESPERAR EN LAS CONSULTAS PRENATALES Durante una visita prenatal de rutina:  La pesarn para asegurarse de que usted y el feto estn creciendo normalmente.  Le tomarn la presin arterial.  Le medirn el abdomen para controlar el desarrollo del beb.  Se escucharn los latidos cardacos fetales.  Se evaluarn los resultados de los estudios solicitados en visitas anteriores. El mdico puede preguntarle lo siguiente:  Cmo se siente.  Si siente los movimientos del beb.  Si ha tenido sntomas anormales, como prdida de lquido, sangrado, dolores de cabeza intensos o clicos abdominales.  Si est consumiendo algn producto que contenga tabaco, como cigarrillos, tabaco de mascar y cigarrillos electrnicos.  Si tiene alguna pregunta. Otros estudios que podrn realizarse durante el segundo trimestre incluyen lo siguiente:  Anlisis de sangre para detectar lo siguiente:  Concentraciones de hierro bajas (anemia).  Diabetes gestacional (entre la semana 24 y la 28).  Anticuerpos Rh.  Anlisis de orina para detectar infecciones, diabetes o protenas en la   orina.  Una ecografa para confirmar que el beb crece y se desarrolla correctamente.  Una amniocentesis para diagnosticar posibles problemas genticos.  Estudios del feto para descartar espina bfida y sndrome de Down.  Prueba del VIH (virus  de inmunodeficiencia humana). Los exmenes prenatales de rutina incluyen la prueba de deteccin del VIH, a menos que decida no realizrsela. INSTRUCCIONES PARA EL CUIDADO EN EL HOGAR   Evite fumar, consumir hierbas, beber alcohol y tomar frmacos que no le hayan recetado. Estas sustancias qumicas afectan la formacin y el desarrollo del beb.  No consuma ningn producto que contenga tabaco, lo que incluye cigarrillos, tabaco de mascar y cigarrillos electrnicos. Si necesita ayuda para dejar de fumar, consulte al mdico. Puede recibir asesoramiento y otro tipo de recursos para dejar de fumar.  Siga las indicaciones del mdico en relacin con el uso de medicamentos. Durante el embarazo, hay medicamentos que son seguros de tomar y otros que no.  Haga ejercicio solamente como se lo haya indicado el mdico. Sentir clicos uterinos es un buen signo para detener la actividad fsica.  Contine comiendo alimentos sanos con regularidad.  Use un sostn que le brinde buen soporte si le duelen las mamas.  No se d baos de inmersin en agua caliente, baos turcos ni saunas.  Use el cinturn de seguridad en todo momento mientras conduce.  No coma carne cruda ni queso sin cocinar; evite el contacto con las bandejas sanitarias de los gatos y la tierra que estos animales usan. Estos elementos contienen grmenes que pueden causar defectos congnitos en el beb.  Tome las vitaminas prenatales.  Tome entre 1500 y 2000mg de calcio diariamente comenzando en la semana20 del embarazo hasta el parto.  Si est estreida, pruebe un laxante suave (si el mdico lo autoriza). Consuma ms alimentos ricos en fibra, como vegetales y frutas frescos y cereales integrales. Beba gran cantidad de lquido para mantener la orina de tono claro o color amarillo plido.  Dese baos de asiento con agua tibia para aliviar el dolor o las molestias causadas por las hemorroides. Use una crema para las hemorroides si el mdico la  autoriza.  Si tiene venas varicosas, use medias de descanso. Eleve los pies durante 15minutos, 3 o 4veces por da. Limite el consumo de sal en su dieta.  No levante objetos pesados, use zapatos de tacones bajos y mantenga una buena postura.  Descanse con las piernas elevadas si tiene calambres o dolor de cintura.  Visite a su dentista si an no lo ha hecho durante el embarazo. Use un cepillo de dientes blando para higienizarse los dientes y psese el hilo dental con suavidad.  Puede seguir manteniendo relaciones sexuales, a menos que el mdico le indique lo contrario.  Concurra a todas las visitas prenatales segn las indicaciones de su mdico. SOLICITE ATENCIN MDICA SI:   Tiene mareos.  Siente clicos leves, presin en la pelvis o dolor persistente en el abdomen.  Tiene nuseas, vmitos o diarrea persistentes.  Observa una secrecin vaginal con mal olor.  Siente dolor al orinar. SOLICITE ATENCIN MDICA DE INMEDIATO SI:   Tiene fiebre.  Tiene una prdida de lquido por la vagina.  Tiene sangrado o pequeas prdidas vaginales.  Siente dolor intenso o clicos en el abdomen.  Sube o baja de peso rpidamente.  Tiene dificultad para respirar y siente dolor de pecho.  Sbitamente se le hinchan mucho el rostro, las manos, los tobillos, los pies o las piernas.  No ha sentido los movimientos del beb durante   una hora.  Siente un dolor de cabeza intenso que no se alivia con medicamentos.  Su visin se modifica.   Esta informacin no tiene como fin reemplazar el consejo del mdico. Asegrese de hacerle al mdico cualquier pregunta que tenga.   Document Released: 11/13/2004 Document Revised: 02/24/2014 Elsevier Interactive Patient Education 2016 Else[employer] Inc[employer] Parto vaginal despus de una cesrea (Vaginal Birth After Cesarean Delivery) Un parto vaginal despus de un parto por cesrea es dar a luz por la vagina despus de haber dado a luz por medio de una intervencin  quirrgica. En el pasado, si una mujer tena un beb por cesrea, todos los partos posteriores deban hacerse por cesrea. Esto ya no es as. Puede ser seguro para la mam intentar un parto vaginal luego de una cesrea.  Es importante que converse con su mdico desde comienzos del embarazo de modo que pueda comprender los riesgos, beneficios y opciones. Le dar tiempo para decidir qu es lo mejor en su caso particular. La decisin final de tener un parto vaginal o por cesrea debe tomarse en conjunto, entre usted y el mdico. Cualquier cambio en su salud o la de su beb durante el embarazo puede ser motivo de un cambio de decisin respecto del parto vaginal.  LAS MUJERES QUE OPTAN POR EL PARTO VAGINAL, DEBEN CONSULTAR AL MDICO PARA ASEGURARSE DE QUE: La cesrea anterior se haya realizado con un corte (incisin) uterino transversal (no con una incisin vertical clsica). El canal de parto es lo suficientemente grande como para que pase el nio. No ha sido sometida a otras operaciones del tero. Durante el trabajo de parto, le realizarn un monitoreo fetal electrnico, en todo momento. Habr un quirfano disponible y listo en caso de necesitar una cesrea de emergencia. Un mdico y personal de quirfano estarn disponibles en todo momento durante el trabajo de parto, para realizar una cesrea en caso de ser necesario. Habr un anestesista disponible en caso de necesitar una cesrea de emergencia. La nursery est lista y cuenta con personal especializado y el equipo disponible para cuidar al beb en caso de emergencia. BENEFICIOS DEL PARTO VAGINAL: Permanencia ms breve en el hospital. Prevencin de los riesgos asociados con el parto por cesrea, por ejemplo: Complicaciones quirrgicas, como apertura o hernia de la incisin. Lesiones en otros rganos. Fiebre. Esto puede ocurrir si aparece una infeccin despus de la ciruga. Tambin puede ocurrir como reaccin a los medicamentos administrados para  adormecerla durante la ciruga. Menos prdida de sangre y menos probabilidad de necesitar una transfusin sangunea. Menor riesgo de cogulos sanguneos e infeccin. Tiempo ms corto de recuperacin. Menor riesgo de remocin del tero (histerectoma). Menor riesgo de que la placenta cubra parcial o completamente la abertura del tero (placenta previa) en embarazos futuros. Menos riesgos en el trabajo de parto y el parto futuros. RIESGOS Ruptura del tero. Esto ocurre en menos del 1% de los partos vaginales. El riesgo de que eso suceda es mayor si: Se toman medidas para iniciar el proceso del trabajo de parto (inducir el parto) o estimular o intensificar las contracciones (aumentar el trabajo de parto). Se usan medicamentos para ablandar (madurar) el cuello del tero. Es necesario extraer el tero (histerectoma) si se rompe. No debe llevarse a cabo si: La cesrea previa se realiz con una incisin vertical (clsica) o con forma de T, o usted no sabe cul de ellas le han practicado. Ha sufrido ruptura del tero. Ha tenido ciertos tipos de ciruga en el tero, como la extirpacin de fibromas uterinos.   Pregntele a su mdico sobre otros tipos de cirugas que le impiden tener un parto vaginal. Tiene ciertos problemas mdicos o relacionados con el parto (obsttricos). El beb est en problemas. Tuvo dos cesreas previas y ningn parto vaginal. OTRAS COSAS QUE DEBE SABER: La anestesia peridural es segura. Es seguro dar vuelta al beb si se encuentra de nalgas (intentar una versin ceflica externa). Es seguro intentarlo en caso de mellizos. El parto vaginal puede no ser apropiado si el beb pesa 8,8lb (4kg) o ms. Sin embargo, las predicciones de peso no son siempre exactas y no deben ser lo nico a tenerse en cuenta para decidir si el parto vaginal es lo indicado para usted. Hay aumento en el porcentaje de fracasos si el intervalo entre la cesrea y el parto vaginal es de menos de 19 meses. Su  mdico puede aconsejarle no tener un parto vaginal si tiene preeclampsia (hipertensin, protena en la orina e hinchazn en la cara y las extremidades). El parto vaginal suele ser exitoso si ya tuvo un parto vaginal previamente. Tambin suele ser exitoso cuando el trabajo de parto comienza espontneamente antes de la fecha. El parto vaginal despus de una cesrea es similar a un parto espontneo vaginal normal.   Esta informacin no tiene como fin reemplazar el consejo del mdico. Asegrese de hacerle al mdico cualquier pregunta que tenga.   Document Released: 07/23/2007 Document Revised: 11/24/2012 Elsevier Interactive Patient Education 2016 Elsevier Inc.  

## 2015-05-24 NOTE — Progress Notes (Signed)
   Subjective:    Lacey Sanders is a G2P1001 8961w6d being seen today for her first obstetrical visit.  Her obstetrical history is significant for prior csection. Patient does intend to breast feed. Pregnancy history fully reviewed.  Patient reports reports intermittent sharp pain around the umbilicus.  Denies change in bowel pattern.  No report of fever, chills, nausea or vomiting. Ceasar Mons.  Filed Vitals:   05/24/15 0842  BP: 128/66  Pulse: 118  Temp: 98.5 F (36.9 C)  Weight: 185 lb (83.915 kg)    HISTORY: OB History  Gravida Para Term Preterm AB SAB TAB Ectopic Multiple Living  2 1 1       1     # Outcome Date GA Lbr Len/2nd Weight Sex Delivery Anes PTL Lv  2 Current           1 Term  5583w0d  7 lb 15 oz (3.6 kg) F CS-Unspec EPI Y Y     History reviewed. No pertinent past medical history. Past Surgical History  Procedure Laterality Date  . Cesarean section     Family History  Problem Relation Age of Onset  . Hyperlipidemia Mother   . Asthma Maternal Grandfather      Exam   Filed Vitals:   05/24/15 0842  BP: 128/66  Pulse: 118  Temp: 98.5 F (36.9 C)   System: Breast:  No nipple retraction or dimpling, No nipple discharge or bleeding, No axillary or supraclavicular adenopathy, Normal to palpation without dominant masses   Skin: normal coloration and turgor, no rashes    Neurologic: negative   Extremities: normal strength, tone, and muscle mass   HEENT neck supple with midline trachea and thyroid without masses   Mouth/Teeth mucous membranes moist, pharynx normal without lesions   Neck supple and no masses   Cardiovascular: regular rate and rhythm, no murmurs or gallops   Respiratory:  appears well, vitals normal, no respiratory distress, acyanotic, normal RR, neck free of mass or lymphadenopathy, chest clear, no wheezing, crepitations, rhonchi, normal symmetric air entry   Abdomen: soft; bowel sounds normal; no masses,  no organomegaly; no evidence of  hernia, mild tenderness with palpation around the umbilicus      Assessment:    Pregnancy: G2P1001 Patient Active Problem List   Diagnosis Date Noted  . Supervision of high risk pregnancy, antepartum 05/24/2015  . Previous cesarean delivery affecting pregnancy, antepartum 05/24/2015  . Pap smear for cervical cancer screening 07/06/2014  . Environmental allergies 06/10/2013  . Weight gain 06/10/2013  . Irregular menstrual cycle 06/10/2013  . Allergic rhinitis 06/10/2013  . Dizziness and giddiness 09/23/2012  . GERD (gastroesophageal reflux disease) 09/23/2012  . RLQ abdominal pain 09/03/2012  . IBS (irritable bowel syndrome) 09/03/2012  . Pain in joint, pelvic region and thigh 08/27/2012     Urine results not available at discharge.       Plan:     Initial labs drawn. Early 1 hr.  Report abdominal pain that is severe and does not resolve.  Also report any change in bowel pattern.  Prenatal vitamins. Problem list reviewed and updated. Genetic Screening discussed First Screen and Quad Screen: declined.  Follow up in 4 weeks.  Marlis EdelsonKARIM, WALIDAH N 05/24/2015

## 2015-05-25 LAB — PRENATAL PROFILE (SOLSTAS)
Antibody Screen: NEGATIVE
Basophils Absolute: 0 cells/uL (ref 0–200)
Basophils Relative: 0 %
EOS ABS: 115 {cells}/uL (ref 15–500)
Eosinophils Relative: 1 %
HCT: 38.2 % (ref 35.0–45.0)
HIV 1&2 Ab, 4th Generation: NONREACTIVE
Hemoglobin: 13 g/dL (ref 11.7–15.5)
Hepatitis B Surface Ag: NEGATIVE
Lymphocytes Relative: 17 %
Lymphs Abs: 1955 cells/uL (ref 850–3900)
MCH: 30.9 pg (ref 27.0–33.0)
MCHC: 34 g/dL (ref 32.0–36.0)
MCV: 90.7 fL (ref 80.0–100.0)
MPV: 9.8 fL (ref 7.5–12.5)
Monocytes Absolute: 690 cells/uL (ref 200–950)
Monocytes Relative: 6 %
Neutro Abs: 8740 cells/uL — ABNORMAL HIGH (ref 1500–7800)
Neutrophils Relative %: 76 %
PLATELETS: 293 10*3/uL (ref 140–400)
RBC: 4.21 MIL/uL (ref 3.80–5.10)
RDW: 13.5 % (ref 11.0–15.0)
RH TYPE: POSITIVE
Rubella: 9.22 Index — ABNORMAL HIGH (ref <0.90)
WBC: 11.5 10*3/uL — AB (ref 3.8–10.8)

## 2015-05-25 LAB — HEMOGLOBINOPATHY EVALUATION

## 2015-05-25 LAB — GLUCOSE TOLERANCE, 1 HOUR (50G) W/O FASTING: Glucose, 1 Hr, gestational: 114 mg/dL (ref <140)

## 2015-05-26 LAB — PRESCRIPTION MONITORING PROFILE (19 PANEL)
AMPHETAMINE/METH: NEGATIVE ng/mL
Barbiturate Screen, Urine: NEGATIVE ng/mL
Benzodiazepine Screen, Urine: NEGATIVE ng/mL
Buprenorphine, Urine: NEGATIVE ng/mL
Cannabinoid Scrn, Ur: NEGATIVE ng/mL
Carisoprodol, Urine: NEGATIVE ng/mL
Cocaine Metabolites: NEGATIVE ng/mL
Creatinine, Urine: 68.34 mg/dL (ref >20.0)
ECSTASY: NEGATIVE ng/mL
FENTANYL URINE: NEGATIVE ng/mL
MEPERIDINE UR: NEGATIVE ng/mL
METHADONE SCREEN, URINE: NEGATIVE ng/mL
Methaqualone: NEGATIVE ng/mL
NITRITES URINE, INITIAL: NEGATIVE ug/mL
Opiate Screen, Urine: NEGATIVE ng/mL
Oxycodone Screen, Ur: NEGATIVE ng/mL
PROPOXYPHENE: NEGATIVE ng/mL
Phencyclidine, Ur: NEGATIVE ng/mL
Tapentadol, urine: NEGATIVE ng/mL
Tramadol Scrn, Ur: NEGATIVE ng/mL
Zolpidem, Urine: NEGATIVE ng/mL
pH, Initial: 6 pH (ref 4.5–8.9)

## 2015-05-26 LAB — CULTURE, OB URINE
Colony Count: NO GROWTH
ORGANISM ID, BACTERIA: NO GROWTH

## 2015-05-28 LAB — HEMOGLOBIN EVAL RFX ELECTROPHORESIS
HEMOGLOBIN OTHER: 0 %
HGB A2 QUANT: 2.8 % (ref 2.2–3.2)
HGB A: 96.9 % (ref 96.8–97.8)
HGB S QUANTITAION: 0 %
Hgb F Quant: 0.3 % (ref 0.0–2.0)

## 2015-06-20 ENCOUNTER — Encounter: Payer: Self-pay | Admitting: *Deleted

## 2015-06-20 ENCOUNTER — Ambulatory Visit (INDEPENDENT_AMBULATORY_CARE_PROVIDER_SITE_OTHER): Payer: Self-pay | Admitting: Advanced Practice Midwife

## 2015-06-20 VITALS — BP 129/48 | HR 92 | Wt 188.0 lb

## 2015-06-20 DIAGNOSIS — Z3492 Encounter for supervision of normal pregnancy, unspecified, second trimester: Secondary | ICD-10-CM

## 2015-06-20 DIAGNOSIS — Z3482 Encounter for supervision of other normal pregnancy, second trimester: Secondary | ICD-10-CM

## 2015-06-20 DIAGNOSIS — Z1389 Encounter for screening for other disorder: Secondary | ICD-10-CM

## 2015-06-20 LAB — POCT URINALYSIS DIP (DEVICE)
Bilirubin Urine: NEGATIVE
GLUCOSE, UA: NEGATIVE mg/dL
Ketones, ur: NEGATIVE mg/dL
Leukocytes, UA: NEGATIVE
Nitrite: NEGATIVE
Protein, ur: NEGATIVE mg/dL
SPECIFIC GRAVITY, URINE: 1.015 (ref 1.005–1.030)
UROBILINOGEN UA: 0.2 mg/dL (ref 0.0–1.0)
pH: 7 (ref 5.0–8.0)

## 2015-06-20 NOTE — Progress Notes (Signed)
Subjective:  Lacey Sanders is a 30 y.o. G2P1001 at 9865w5d being seen today for ongoing prenatal care.  She is currently monitored for the following issues for this low-risk pregnancy and has Pain in joint, pelvic region and thigh; RLQ abdominal pain; IBS (irritable bowel syndrome); Dizziness and giddiness; GERD (gastroesophageal reflux disease); Environmental allergies; Weight gain; Irregular menstrual cycle; Allergic rhinitis; Pap smear for cervical cancer screening; Supervision of normal pregnancy, antepartum; and Previous cesarean delivery affecting pregnancy, antepartum on her problem list.  Patient reports No complaints.   . Vag. Bleeding: None.  Movement: Present. Denies leaking of fluid.   The following portions of the patient's history were reviewed and updated as appropriate: allergies, current medications, past family history, past medical history, past social history, past surgical history and problem list. Problem list updated.  Objective:   Filed Vitals:   06/20/15 1024  BP: 129/48  Pulse: 92  Weight: 188 lb (85.276 kg)    Fetal Status: Fetal Heart Rate (bpm): 150   Movement: Present     General:  Alert, oriented and cooperative. Patient is in no acute distress.  Skin: Skin is warm and dry. No rash noted.   Cardiovascular: Normal heart rate noted  Respiratory: Normal respiratory effort, no problems with respiration noted  Abdomen: Soft, gravid, appropriate for gestational age. Pain/Pressure: Present     Pelvic: Vag. Bleeding: None     Cervical exam deferred        Extremities: Normal range of motion.  Edema: None  Mental Status: Normal mood and affect. Normal behavior. Normal judgment and thought content.   Urinalysis: Urine Protein: Negative Urine Glucose: Negative  Assessment and Plan:  Pregnancy: G2P1001 at 7465w5d  1. Supervision of normal pregnancy in second trimester  - US MFM OB COMP + 14 WK; Future  2. Encounter for routine screening for  malformation using ultrasonics  - US MFM OB COMP + 14 WK; Future  3. Supervision of normal pregnancy, antepartum, second trimester  - Declines quad  Preterm labor symptoms and general obstetric precautions including but not limited to vaginal bleeding, contractions, leaking of fluid and fetal movement were reviewed in detail with the patient. Please refer to After Visit Summary for other counseling recommendations.  Return in about 4 weeks (around 07/18/2015).   Dorathy KinsmanVirginia Erion Hermans, CNM

## 2015-06-20 NOTE — Patient Instructions (Signed)

## 2015-06-28 ENCOUNTER — Encounter (HOSPITAL_COMMUNITY): Payer: Self-pay | Admitting: Advanced Practice Midwife

## 2015-07-09 ENCOUNTER — Ambulatory Visit (HOSPITAL_COMMUNITY): Payer: Self-pay

## 2015-07-09 ENCOUNTER — Other Ambulatory Visit: Payer: Self-pay | Admitting: Advanced Practice Midwife

## 2015-07-09 ENCOUNTER — Ambulatory Visit (HOSPITAL_COMMUNITY)
Admission: RE | Admit: 2015-07-09 | Discharge: 2015-07-09 | Disposition: A | Discharge status: Home / Self Care | Payer: Self-pay | Source: Ambulatory Visit | Attending: Advanced Practice Midwife | Admitting: Advanced Practice Midwife

## 2015-07-09 DIAGNOSIS — Z36 Encounter for antenatal screening of mother: Secondary | ICD-10-CM | POA: Insufficient documentation

## 2015-07-09 DIAGNOSIS — Z1389 Encounter for screening for other disorder: Secondary | ICD-10-CM

## 2015-07-09 DIAGNOSIS — O34212 Maternal care for vertical scar from previous cesarean delivery: Secondary | ICD-10-CM | POA: Insufficient documentation

## 2015-07-09 DIAGNOSIS — Z3492 Encounter for supervision of normal pregnancy, unspecified, second trimester: Secondary | ICD-10-CM

## 2015-07-09 DIAGNOSIS — Z3A19 19 weeks gestation of pregnancy: Secondary | ICD-10-CM | POA: Insufficient documentation

## 2015-07-18 ENCOUNTER — Encounter: Payer: Self-pay | Admitting: Family

## 2015-07-18 ENCOUNTER — Ambulatory Visit (INDEPENDENT_AMBULATORY_CARE_PROVIDER_SITE_OTHER): Payer: Self-pay | Admitting: Family

## 2015-07-18 VITALS — BP 126/62 | HR 88 | Wt 192.6 lb

## 2015-07-18 DIAGNOSIS — O34219 Maternal care for unspecified type scar from previous cesarean delivery: Secondary | ICD-10-CM

## 2015-07-18 DIAGNOSIS — Z3482 Encounter for supervision of other normal pregnancy, second trimester: Secondary | ICD-10-CM

## 2015-07-18 LAB — POCT URINALYSIS DIP (DEVICE)
BILIRUBIN URINE: NEGATIVE
GLUCOSE, UA: NEGATIVE mg/dL
Hgb urine dipstick: NEGATIVE
KETONES UR: NEGATIVE mg/dL
Leukocytes, UA: NEGATIVE
Nitrite: NEGATIVE
PROTEIN: NEGATIVE mg/dL
SPECIFIC GRAVITY, URINE: 1.01 (ref 1.005–1.030)
Urobilinogen, UA: 0.2 mg/dL (ref 0.0–1.0)
pH: 7 (ref 5.0–8.0)

## 2015-07-18 NOTE — Patient Instructions (Signed)
Magnesium lactate o Magnesium citrate 5 mmol in the morning and 10 mmol in the evening.

## 2015-07-18 NOTE — Progress Notes (Signed)
Subjective:  Lacey Sanders is a 30 y.o. G2P1001 at 7978w5d being seen today for ongoing prenatal care.  She is currently monitored for the following issues for this low-risk pregnancy and has Pain in joint, pelvic region and thigh; RLQ abdominal pain; IBS (irritable bowel syndrome); Dizziness and giddiness; GERD (gastroesophageal reflux disease); Environmental allergies; Weight gain; Irregular menstrual cycle; Allergic rhinitis; Pap smear for cervical cancer screening; Supervision of normal pregnancy, antepartum; and Previous cesarean delivery affecting pregnancy, antepartum on her problem list.  Patient reports leg cramping at night.   . Vag. Bleeding: None.  Movement: Present. Denies leaking of fluid.   The following portions of the patient's history were reviewed and updated as appropriate: allergies, current medications, past family history, past medical history, past social history, past surgical history and problem list. Problem list updated.  Objective:   Filed Vitals:   07/18/15 0820  BP: 126/62  Pulse: 88  Weight: 192 lb 9.6 oz (87.363 kg)    Fetal Status: Fetal Heart Rate (bpm): 143 Fundal Height: 21 cm Movement: Present     General:  Alert, oriented and cooperative. Patient is in no acute distress.  Skin: Skin is warm and dry. No rash noted.   Cardiovascular: Normal heart rate noted  Respiratory: Normal respiratory effort, no problems with respiration noted  Abdomen: Soft, gravid, appropriate for gestational age. Pain/Pressure: Present     Pelvic: Vag. Bleeding: None     Cervical exam deferred        Extremities: Normal range of motion.  Edema: None  Mental Status: Normal mood and affect. Normal behavior. Normal judgment and thought content.   Urinalysis:     Protein neg  Glucose neg  Assessment and Plan:  Pregnancy: G2P1001 at 5278w5d  1. Supervision of normal pregnancy, antepartum, second trimester - Reviewed new OB lab results - Reviewed ultrasound results >  normal, but limited  2. Previous cesarean delivery affecting pregnancy, antepartum - Plans repeat  3.  Leg Cramps - Recommended Magnesium supplements and stretching  Preterm labor symptoms and general obstetric precautions including but not limited to vaginal bleeding, contractions, leaking of fluid and fetal movement were reviewed in detail with the patient. Please refer to After Visit Summary for other counseling recommendations.  Return in about 4 weeks (around 08/15/2015).   Eino FarberWalidah Kennith GainN Karim, CNM

## 2015-07-23 ENCOUNTER — Telehealth: Payer: Self-pay | Admitting: *Deleted

## 2015-07-23 NOTE — Telephone Encounter (Signed)
Lacey Sanders called front desk and asked to speak to a nurse.  Used Interpreter Lacey Sanders and she states was told by doctor at last visit 07/18/15 to take magnesium citrate 5mmol and she wants to know if she should get a 250ml bottle or 100ml bottle.  Per chart review at last visit was told to take magnesium supplements daily for leg cramps.  Verified with patient she is wanting to take something for leg cramps. I discussed with Dr. Shawnie PonsPratt and patient should take magnesium supplements not magnesium citrate.  May take either 400mg  or 800 mg daily. I informed patient of this - she was adamant was supposed to be magnesium citrate. I informed her magnesium citrate is a bowel prep and will make her use the bathroom. She should take magnesium supplements for her leg cramps. She voices understanding.

## 2015-08-15 ENCOUNTER — Ambulatory Visit (INDEPENDENT_AMBULATORY_CARE_PROVIDER_SITE_OTHER): Payer: Self-pay | Admitting: Student

## 2015-08-15 DIAGNOSIS — Z3482 Encounter for supervision of other normal pregnancy, second trimester: Secondary | ICD-10-CM

## 2015-08-15 LAB — POCT URINALYSIS DIP (DEVICE)
Bilirubin Urine: NEGATIVE
Glucose, UA: NEGATIVE mg/dL
Ketones, ur: NEGATIVE mg/dL
Leukocytes, UA: NEGATIVE
NITRITE: NEGATIVE
PH: 7.5 (ref 5.0–8.0)
Protein, ur: 100 mg/dL — AB
Specific Gravity, Urine: 1.02 (ref 1.005–1.030)
UROBILINOGEN UA: 0.2 mg/dL (ref 0.0–1.0)

## 2015-08-15 NOTE — Progress Notes (Signed)
"  Rose" [phone] Spanish video interpreter used Breastfeeding tip reviewed Patient wants to try TOL

## 2015-08-15 NOTE — Patient Instructions (Signed)
Información sobre el parto prematuro  °(Preterm Labor Information) ° Se llama parto prematuro cuando se inicia antes de las 37 semanas de embarazo. La duración de un embarazo normal es de 39 a 41 semanas.  °CAUSAS  °Generalmente las causas del parto prematuro no se conocen. La causa más frecuente conocida es una infección.  °FACTORES DE RIESGO  °· Historia previa de parto prematuro. °· Romper la bolsa de aguas antes de tiempo. °· La placenta cubre la abertura del cuello. °· La placenta se despega del útero. °· El cuello es demasiado débil para contener al bebé en el útero. °· Hay mucho líquido en el saco amniótico. °· Consumo de drogas o hábito de fumar durante el embarazo. °· No aumentar de peso lo suficiente durante el embarazo. °· Mujeres menores de 18 años o mayores de 35 años. °· Tener bajos ingresos. °· Pertenecer a la raza afroamericana. °SÍNTOMAS  °· Cólicos similares a los menstruales, dolor en el vientre (abdominal) o dolor en la espalda. °· Contracciones regulares, tan frecuentes como seis en una hora. Pueden ser suaves o dolorosas. °· Contracciones que comienzan en la parte superior del vientre. Luego bajan hacia la zona inferior del vientre y hacia la espalda. °· Presión en la zona inferior del vientre que parece empeorar. °· Sangrado que proviene de la vagina. °· Pérdida de líquido por la vagina. °TRATAMIENTO  °El tratamiento depende de:  °· Su estado. °· El estado del bebé. °· Cuántas semanas tiene de embarazo. °El médico podrá indicarle:  °· Medicamentos para detener las contracciones. °· Que permanezca en la cama excepto para ir al baño (reposo en cama). °· Que permanezca en el hospital. °¿QUÉ DEBE HACER SI PIENSA QUE ESTÁ EN TRABAJO DE PARTO PREMATURO?  °Comuníquese con su médico de inmediato. Debe concurrir al hospital para ser controlada inmediatamente.  °¿CÓMO PUEDE EVITAR EL TRABAJO DE PARTO PREMATURO EN FUTUROS EMBARAZOS?  °· Si fuma, abandone el hábito. °· Mantenga un aumento de peso  saludable. °· Notome drogas ni manipule sustancias químicas que no necesita. °· Informe a su médico si piensa que tiene una infección. °· Informe a su médico si tuvo un trabajo de parto prematuro anteriormente. °  °Esta información no tiene como fin reemplazar el consejo del médico. Asegúrese de hacerle al médico cualquier pregunta que tenga. °  °Document Released: 03/08/2010 Document Revised: 10/06/2012 °Elsevier Interactive Patient Education ©2016 Elsevier Inc. ° °

## 2015-08-15 NOTE — Progress Notes (Signed)
Subjective:  Lacey Sanders is a 30 y.o. G2P1001 at 432w0d being seen today for ongoing prenatal care.  She is currently monitored for the following issues for this low-risk pregnancy and has Pain in joint, pelvic region and thigh; RLQ abdominal pain; IBS (irritable bowel syndrome); Dizziness and giddiness; GERD (gastroesophageal reflux disease); Environmental allergies; Weight gain; Irregular menstrual cycle; Allergic rhinitis; Pap smear for cervical cancer screening; Supervision of normal pregnancy, antepartum; and Previous cesarean delivery affecting pregnancy, antepartum on her problem list.  Patient reports no complaints.   . Vag. Bleeding: None.  Movement: Present. Denies leaking of fluid.   The following portions of the patient's history were reviewed and updated as appropriate: allergies, current medications, past family history, past medical history, past social history, past surgical history and problem list. Problem list updated.  Objective:   Filed Vitals:   08/15/15 0856  BP: 120/66  Pulse: 85  Weight: 193 lb 4.8 oz (87.68 kg)    Fetal Status: Fetal Heart Rate (bpm): 150 Fundal Height: 25 cm Movement: Present     General:  Alert, oriented and cooperative. Patient is in no acute distress.  Skin: Skin is warm and dry. No rash noted.   Cardiovascular: Normal heart rate noted  Respiratory: Normal respiratory effort, no problems with respiration noted  Abdomen: Soft, gravid, appropriate for gestational age. Pain/Pressure: Present     Pelvic: Cervical exam deferred        Extremities: Normal range of motion.  Edema: Trace  Mental Status: Normal mood and affect. Normal behavior. Normal judgment and thought content.   Urinalysis: Urine Protein: 2+ Urine Glucose: Negative  Assessment and Plan:  Pregnancy: G2P1001 at 692w0d  1. Normal pregnancy, second trimester  - US MFM OB FOLLOW UP; Future     Preterm labor symptoms and general obstetric precautions including  but not limited to vaginal bleeding, contractions, leaking of fluid and fetal movement were reviewed in detail with the patient. Please refer to After Visit Summary for other counseling recommendations.  Return in about 2 weeks (around 08/29/2015) for obfu/ 1  hr gtt, Routine OB.   Judeth HornErin Julie Nay, NP

## 2015-08-15 NOTE — Progress Notes (Signed)
Will do 1 hr gtt next visit since she had early 1 hr GTT.

## 2015-08-20 ENCOUNTER — Ambulatory Visit (HOSPITAL_COMMUNITY)
Admission: RE | Admit: 2015-08-20 | Discharge: 2015-08-20 | Disposition: A | Discharge status: Home / Self Care | Payer: Self-pay | Source: Ambulatory Visit | Attending: Student | Admitting: Student

## 2015-08-20 ENCOUNTER — Ambulatory Visit (HOSPITAL_COMMUNITY): Payer: Self-pay

## 2015-08-20 ENCOUNTER — Other Ambulatory Visit: Payer: Self-pay | Admitting: Student

## 2015-08-20 DIAGNOSIS — Z3A26 26 weeks gestation of pregnancy: Secondary | ICD-10-CM | POA: Insufficient documentation

## 2015-08-20 DIAGNOSIS — Z3482 Encounter for supervision of other normal pregnancy, second trimester: Secondary | ICD-10-CM

## 2015-08-20 DIAGNOSIS — Z3A25 25 weeks gestation of pregnancy: Secondary | ICD-10-CM

## 2015-08-20 DIAGNOSIS — O34219 Maternal care for unspecified type scar from previous cesarean delivery: Secondary | ICD-10-CM

## 2015-08-20 DIAGNOSIS — IMO0002 Reserved for concepts with insufficient information to code with codable children: Secondary | ICD-10-CM

## 2015-08-20 DIAGNOSIS — Z0489 Encounter for examination and observation for other specified reasons: Secondary | ICD-10-CM

## 2015-08-20 DIAGNOSIS — Z36 Encounter for antenatal screening of mother: Secondary | ICD-10-CM | POA: Insufficient documentation

## 2015-08-27 ENCOUNTER — Ambulatory Visit (HOSPITAL_COMMUNITY): Payer: Self-pay

## 2015-09-04 ENCOUNTER — Encounter: Payer: Self-pay | Admitting: Clinical

## 2015-09-04 ENCOUNTER — Ambulatory Visit (INDEPENDENT_AMBULATORY_CARE_PROVIDER_SITE_OTHER): Payer: Medicaid Other | Admitting: Advanced Practice Midwife

## 2015-09-04 ENCOUNTER — Encounter: Payer: Self-pay | Admitting: Advanced Practice Midwife

## 2015-09-04 VITALS — BP 124/67 | HR 85 | Wt 197.1 lb

## 2015-09-04 DIAGNOSIS — Z3482 Encounter for supervision of other normal pregnancy, second trimester: Secondary | ICD-10-CM

## 2015-09-04 DIAGNOSIS — Z23 Encounter for immunization: Secondary | ICD-10-CM

## 2015-09-04 DIAGNOSIS — O34219 Maternal care for unspecified type scar from previous cesarean delivery: Secondary | ICD-10-CM

## 2015-09-04 DIAGNOSIS — Z3402 Encounter for supervision of normal first pregnancy, second trimester: Secondary | ICD-10-CM

## 2015-09-04 LAB — CBC
HCT: 36.7 % (ref 35.0–45.0)
Hemoglobin: 12.2 g/dL (ref 11.7–15.5)
MCH: 30.9 pg (ref 27.0–33.0)
MCHC: 33.2 g/dL (ref 32.0–36.0)
MCV: 92.9 fL (ref 80.0–100.0)
MPV: 10.4 fL (ref 7.5–12.5)
PLATELETS: 278 10*3/uL (ref 140–400)
RBC: 3.95 MIL/uL (ref 3.80–5.10)
RDW: 13.8 % (ref 11.0–15.0)
WBC: 12.5 10*3/uL — AB (ref 3.8–10.8)

## 2015-09-04 LAB — POCT URINALYSIS DIP (DEVICE)
Bilirubin Urine: NEGATIVE
Glucose, UA: NEGATIVE mg/dL
Hgb urine dipstick: NEGATIVE
KETONES UR: NEGATIVE mg/dL
Leukocytes, UA: NEGATIVE
NITRITE: NEGATIVE
PH: 7 (ref 5.0–8.0)
PROTEIN: NEGATIVE mg/dL
Specific Gravity, Urine: 1.01 (ref 1.005–1.030)
Urobilinogen, UA: 0.2 mg/dL (ref 0.0–1.0)

## 2015-09-04 MED ORDER — TETANUS-DIPHTH-ACELL PERTUSSIS 5-2.5-18.5 LF-MCG/0.5 IM SUSP
0.5000 mL | Freq: Once | INTRAMUSCULAR | Status: AC
  Administered 2015-09-04: 0.5 mL via INTRAMUSCULAR

## 2015-09-04 NOTE — Patient Instructions (Signed)
Eleccin del mtodo anticonceptivo (Contraception Choices) La anticoncepcin (control de la natalidad) es el uso de cualquier mtodo o dispositivo para evitar el embarazo. A continuacin se indican algunos de esos mtodos. MTODOS HORMONALES   El Implante contraconceptivo consiste en un tubo plstico delgado que contiene la hormona progesterona. No contiene estrgenos. El mdico inserta el tubo en la parte interna del brazo. El tubo puede permanecer en el lugar durante 3 aos. Despus de los 3 aos debe retirarse. El implante impide que los ovarios liberen vulos (ovulacin), espesa el moco cervical, lo que evita que los espermatozoides ingresen al tero y hace ms delgada la membrana que cubre el interior del tero.  Inyecciones de progesterona sola: las administra el mdico cada 3 meses para evitar el embarazo. La progesterona sinttica impide que los ovarios liberen vulos. Tambin hacen que el moco cervical se espese y modifique el tejido de recubrimiento interno del tero. Esto hace ms difcil que los espermatozoides sobrevivan en el tero.  Las pldoras anticonceptivas contienen estrgenos y progesterona. Su funcin es evitar que los ovarios liberen vulos (ovulacin). Las hormonas de los anticonceptivos orales hacen que el moco cervical se haga ms espeso, lo que evita que el esperma ingrese al tero. Las pldoras anticonceptivas son recetadas por el mdico.Tambin se utilizan para tratar los perodos menstruales abundantes.  Minipldora: este tipo de pldora anticonceptiva contiene slo hormona progesterona. Deben tomarse todos los das del mes y debe recetarlas el mdico.  El parche de control de natalidad: contiene hormonas similares a las que contienen las pldoras anticonceptivas. Deben cambiarse una vez por semana y se utilizan bajo prescripcin mdica.  Anillo vaginal: contiene hormonas similares a las que contienen las pldoras anticonceptivas. Se deja colocado durante tres semanas,  se lo retira durante 1 semana y luego se coloca uno nuevo. La paciente debe sentirse cmoda al insertar y retirar el anillo de la vagina.Es necesaria la prescripcin mdica.  Anticonceptivos de emergencia: son mtodos para evitar un embarazo despus de una relacin sexual sin proteccin. Esta pldora puede tomarse inmediatamente despus de tener relaciones sexuales o hasta 5 das de haber tenido sexo sin proteccin. Es ms efectiva si se toma poco tiempo despus de la relacin sexual. Los anticonceptivos de emergencia estn disponibles sin prescripcin mdica. Consltelo con su farmacutico. No use los anticonceptivos de emergencia como nico mtodo anticonceptivo. MTODOS DE BARRERA   Condn masculino: es una vaina delgada (ltex o goma) que se coloca cubriendo al pene durante el acto sexual. Puede usarse con espermicida para aumentar la efectividad.  Condn femenino. Es una funda delicada y blanda que se adapta holgadamente a la vagina antes de las relaciones sexuales.  Diafragma: es una barrera de ltex redonda y suave que debe ser recomendado por un profesional. Se inserta en la vagina, junto con un gel espermicida. Debe insertarse antes de tener relaciones sexuales. Debe dejar el diafragma colocado en la vagina durante 6 a 8 horas despus de la relacin sexual.  Capuchn cervical: es una barrera de ltex o taza plstica redonda y suave que cubre el cuello del tero y debe ser colocada por un mdico. Puede dejarlo colocado en la vagina hasta 48 horas despus de las relaciones sexuales.  Esponja: es una pieza blanda y circular de espuma de poliuretano. Contiene un espermicida. Se inserta en la vagina despus de mojarla y antes de las relaciones sexuales.  Espermicidas: son sustancias qumicas que matan o bloquean al esperma y no lo dejan ingresar al cuello del tero y al tero. Vienen   en forma de cremas, geles, supositorios, espuma o comprimidos. No es necesario tener receta mdica. Se insertan en  la vagina con un aplicador antes de tener relaciones sexuales. El proceso debe repetirse cada vez que tiene relaciones sexuales. ANTICONCEPTIVOS INTRAUTERINOS  Dispositivo intrauterino (DIU) es un dispositivo en forma de T que se coloca en el tero durante el perodo menstrual, para evitar el embarazo. Hay dos tipos:  DIU de cobre: este tipo de DIU est recubierto con un alambre de cobre y se inserta dentro del tero. El cobre hace que el tero y las trompas de Falopio produzcan un liquido que destruye los espermatozoides. Puede permanecer colocado durante 10 aos.  DIU con hormona: este tipo de DIU contiene la hormona progestina (progesterona sinttica). La hormona espesa el moco cervical y evita que los espermatozoides ingresen al tero y tambin afina la membrana que cubre el tero para evitar la implantacin del vulo fertilizado. La hormona debilita o destruye los espermatozoides que ingresan al tero. Puede permanecer en el lugar durante 3-5 aos, segn el tipo de DIU que se utilice. MTODOS ANTICONCEPTIVOS PERMANENTES  Ligadura de trompas en la mujer: se realiza sellando, atando u obstruyendo quirrgicamente las trompas de Falopio lo que impide que el vulo descienda hacia el tero.  Esterilizacin histeroscpica: Implica la colocacin de un pequeo espiral o la insercin en cada trompa de Falopio. El mdico utiliza una tcnica llamada histeroscopa para realizar este procedimiento. El dispositivo produce la formacin de tejido cicatrizal. Esto da como resultado una obstruccin permanente de las trompas de Falopio, de modo que la esperma no pueda fertilizar el vulo. Demora alrededor de 3 meses despus del procedimiento hasta que el conducto se obstruye. Tendr que usar otro mtodo anticonceptivo durante al menos 3 meses.  Esterilizacin masculina: se realiza ligando los conductos por los que pasan los espermatozoides (vasectoma).Esto impide que el esperma ingrese a la vagina durante el acto  sexual. Luego del procedimiento, el hombre puede eyacular lquido (semen). MTODOS DE PLANIFICACIN NATURAL  Planificacin familiar natural: consiste en no tener relaciones sexuales o usar un mtodo de barrera (condn, diafragma, capuchn cervical) en los das que la mujer podra quedar embarazada.  Mtodo de calendario: consiste en el seguimiento de la duracin de cada ciclo menstrual y la identificacin de los perodos frtiles.  Mtodo de ovulacin: consiste en evitar las relaciones sexuales durante la ovulacin.  Mtodo sintotrmico: consiste en evitar las relaciones sexuales en la poca en la que se est ovulando, utilizando un termmetro y tendiendo en cuenta los sntomas de la ovulacin.  Mtodo postovulacin: consiste en planificar las relaciones sexuales para despus de haber ovulado. Independientemente del tipo o mtodo anticonceptivo que usted elija, es importante que use condones para protegerse contra las infecciones de transmisin sexual (ETS). Hable con su mdico con respecto a qu mtodo anticonceptivo es el ms apropiado para usted.   Esta informacin no tiene como fin reemplazar el consejo del mdico. Asegrese de hacerle al mdico cualquier pregunta que tenga.   Document Released: 02/03/2005 Document Revised: 10/06/2012 Elsevier Interactive Patient Education 2016 Elsevier Inc.  

## 2015-09-04 NOTE — Progress Notes (Signed)
Subjective:  Lacey Sanders is a 30 y.o. G2P1001 at 7647w6d being seen today for ongoing prenatal care.  She is currently monitored for the following issues for this high-risk pregnancy and has Pain in joint, pelvic region and thigh; RLQ abdominal pain; IBS (irritable bowel syndrome); Dizziness and giddiness; GERD (gastroesophageal reflux disease); Environmental allergies; Weight gain; Irregular menstrual cycle; Allergic rhinitis; Pap smear for cervical cancer screening; Supervision of normal pregnancy, antepartum; and Previous cesarean delivery affecting pregnancy, antepartum on her problem list.  Patient reports no complaints.  Contractions: Not present. Vag. Bleeding: None.  Movement: Present. Denies leaking of fluid.   The following portions of the patient's history were reviewed and updated as appropriate: allergies, current medications, past family history, past medical history, past social history, past surgical history and problem list. Problem list updated.  Objective:   Filed Vitals:   09/04/15 0801  BP: 124/67  Pulse: 85  Weight: 197 lb 1.6 oz (89.404 kg)    Fetal Status:     Movement: Present     General:  Alert, oriented and cooperative. Patient is in no acute distress.  Skin: Skin is warm and dry. No rash noted.   Cardiovascular: Normal heart rate noted  Respiratory: Normal respiratory effort, no problems with respiration noted  Abdomen: Soft, gravid, appropriate for gestational age. Pain/Pressure: Present     Pelvic:  Cervical exam deferred        Extremities: Normal range of motion.  Edema: Trace  Mental Status: Normal mood and affect. Normal behavior. Normal judgment and thought content.   Urinalysis:      Assessment and Plan:  Pregnancy: G2P1001 at 5747w6d  Glucola today . Preterm labor symptoms and general obstetric precautions including but not limited to vaginal bleeding, contractions, leaking of fluid and fetal movement were reviewed in detail with the  patient. Please refer to After Visit Summary for other counseling recommendations.   RTC 2 weeks  Lacey Sanders, CNM

## 2015-09-05 LAB — RPR

## 2015-09-05 LAB — GLUCOSE TOLERANCE, 1 HOUR (50G) W/O FASTING: Glucose, 1 Hr, gestational: 140 mg/dL — ABNORMAL HIGH (ref <140)

## 2015-09-05 LAB — HIV ANTIBODY (ROUTINE TESTING W REFLEX): HIV: NONREACTIVE

## 2015-09-18 ENCOUNTER — Ambulatory Visit (INDEPENDENT_AMBULATORY_CARE_PROVIDER_SITE_OTHER): Payer: Self-pay | Admitting: Advanced Practice Midwife

## 2015-09-18 DIAGNOSIS — Z3483 Encounter for supervision of other normal pregnancy, third trimester: Secondary | ICD-10-CM

## 2015-09-18 LAB — POCT URINALYSIS DIP (DEVICE)
Bilirubin Urine: NEGATIVE
GLUCOSE, UA: NEGATIVE mg/dL
Hgb urine dipstick: NEGATIVE
Ketones, ur: NEGATIVE mg/dL
Leukocytes, UA: NEGATIVE
NITRITE: NEGATIVE
PROTEIN: NEGATIVE mg/dL
Specific Gravity, Urine: 1.01 (ref 1.005–1.030)
UROBILINOGEN UA: 0.2 mg/dL (ref 0.0–1.0)
pH: 7 (ref 5.0–8.0)

## 2015-09-18 NOTE — Patient Instructions (Signed)
Prueba de tolerancia a la glucosa durante el embarazo (Glucose Tolerance Test During Pregnancy) La prueba de tolerancia a la glucosa es un anlisis de sangre que se [place] para determinar si ha contrado un tipo de diabetes durante el embarazo (diabetes gestacional). Esto es cuando el cuerpo no procesa de forma Arboriculturist (glucosa) en los alimentos que come, lo cual provoca niveles sanguneos altos de glucosa. Por lo general, la PTG se realiza despus de haberse hecho una prueba de glucosa de 1 hora, cuyos resultados indican que posiblemente tiene diabetes gestacional. Tambin se puede hacer si:  Tiene antecedentes de haber parido bebs muy grandes o antecedentes de muerte fetal repetida (beb nacido muerto).  Tiene signos o sntomas de diabetes, tales como:  Cambios en la visin.  Hormigueo o adormecimiento en las manos o los pies.  Cambios en el hambre, la sed y la miccin que no se explican por Firefighter. La PTG dura unas 3 horas. Le darn para beber una solucin de agua y azcar al principio de la prueba. Le extraern sangre antes de que beba la solucin, y 1, 2 y 3 horas despus de beberla. No se le permitir comer ni beber nada ms durante la prueba. Debe Geneticist, molecular en que se realiza la prueba para asegurarse de que la sangre se extraiga puntualmente. Tambin debe evitar realizar ejercicios durante la prueba porque esto puede [employer]. PREPARACIN PARA EL ESTUDIO  Coma normalmente durante 3 das antes de la PTG e incluya muchos alimentos ricos en carbohidratos. No coma ni beba nada, excepto agua, durante las ltimas 12 horas antes de la prueba. Adems, el mdico puede pedirle que deje de tomar ciertos medicamentos antes de la prueba. RESULTADOS  Es su responsabilidad retirar el resultado del [place]. Consulte en el laboratorio o en el departamento en el que fue realizado el estudio cundo y cmo podr [employer]. Comunquese con el mdico si tiene  [employer].  Rango de [place] rangos para los valores normales pueden variar entre diferentes laboratorios y hospitales. Siempre debe consultar a su mdico despus de realizarse un anlisis u otros estudios para saber si los valores de sus [place] se consideran dentro de los lmites normales. Los niveles normales de glucemia son los siguientes:  [place]uno: menos de 105mg /dl.  Una hora despus de beber la solucin: menos de 190mg /dl.  Dos horas despus de beber la solucin: menos de 165mg /dl.  Tres horas despus de beber la solucin: menos de 145mg /dl. Algunas sustancias pueden [employer] de la PTG. Estas pueden incluir lo siguiente:  Medicamentos para la presin arterial y la insuficiencia cardaca, como betabloqueantes, furosemida y tiacidas.  Antiinflamatorios, como aspirina.  Nicotina.  Algunos medicamentos psiquitricos. Significado de los [employer] estn fuera de los rangos de los valores normales Los resultados de la PTG por debajo de los valores normales pueden indicar varios problemas de [place], tales como:   Diabetes gestacional.  Respuesta al estrs agudo.  Sndrome de [place].  Tumores como feocromocitoma o glucagonoma.  Problemas renales de larga duracin.  Pancreatitis.  Hipertiroidismo.  Infeccin actual. Hable con el mdico [employer]. El mdico [employer] para Education officer, environmental un diagnstico y Chief Strategy Officer un plan de tratamiento adecuado para usted.   Esta informacin no tiene Theme park manager el consejo del mdico. Asegrese de hacerle al mdico cualquier pregunta que tenga.   Document Released: 08/05/2011 Document Revised: 02/24/2014 Elsevier Interactive Patient Education [employer]

## 2015-09-18 NOTE — Progress Notes (Signed)
Subjective:  Lacey Sanders is a 30 y.o. G2P1001 at [redacted]w[redacted]d being seen today for ongoing prenatal care.  She is currently monitored for the following issues for this low-risk pregnancy and has Pain in joint, pelvic region and thigh; RLQ abdominal pain; IBS (irritable bowel syndrome); Dizziness and giddiness; GERD (gastroesophageal reflux disease); Environmental allergies; Weight gain; Irregular menstrual cycle; Allergic rhinitis; Pap smear for cervical cancer screening; Supervision of normal pregnancy, antepartum; and Previous cesarean delivery affecting pregnancy, antepartum on her problem list.  Patient reports no complaints.  Contractions: Not present. Vag. Bleeding: None.  Movement: Present. Denies leaking of fluid.   The following portions of the patient's history were reviewed and updated as appropriate: allergies, current medications, past family history, past medical history, past social history, past surgical history and problem list. Problem list updated.  Objective:   Vitals:   09/18/15 1337  BP: 116/73  Pulse: 88  Weight: 197 lb 4.8 oz (89.5 kg)    Fetal Status: Fetal Heart Rate (bpm): 160 Fundal Height: 21 cm Movement: Present  Presentation: Vertex  General:  Alert, oriented and cooperative. Patient is in no acute distress.  Skin: Skin is warm and dry. No rash noted.   Cardiovascular: Normal heart rate noted  Respiratory: Normal respiratory effort, no problems with respiration noted  Abdomen: Soft, gravid, appropriate for gestational age. Pain/Pressure: Present     Pelvic:  Cervical exam deferred        Extremities: Normal range of motion.  Edema: Trace  Mental Status: Normal mood and affect. Normal behavior. Normal judgment and thought content.   Urinalysis:     Neg  Assessment and Plan:  Pregnancy: G2P1001 at [redacted]w[redacted]d  1. Supervision of normal pregnancy, antepartum, third trimester   1 hour GTT elevated - 3 hour GTT ordered   Preterm labor symptoms and  general obstetric precautions including but not limited to vaginal bleeding, contractions, leaking of fluid and fetal movement were reviewed in detail with the patient. Please refer to After Visit Summary for other counseling recommendations.  Return in about 2 weeks (around 10/02/2015) for 3 hour GTT ASAP, then ROB in 2 weeks.   Dorathy Kinsman, CNM

## 2015-09-20 ENCOUNTER — Encounter: Payer: Self-pay | Admitting: *Deleted

## 2015-09-25 ENCOUNTER — Other Ambulatory Visit: Payer: Self-pay

## 2015-09-25 DIAGNOSIS — O99810 Abnormal glucose complicating pregnancy: Secondary | ICD-10-CM

## 2015-09-26 LAB — GLUCOSE TOLERANCE, 3 HOURS
GLUCOSE 3 HOUR GTT: 129 mg/dL (ref <145)
GLUCOSE, FASTING-GESTATIONAL: 83 mg/dL (ref 65–104)
Glucose Tolerance, 1 hour: 170 mg/dL (ref <190)
Glucose Tolerance, 2 hour: 124 mg/dL (ref <165)

## 2015-10-09 ENCOUNTER — Ambulatory Visit (INDEPENDENT_AMBULATORY_CARE_PROVIDER_SITE_OTHER): Payer: Self-pay | Admitting: Advanced Practice Midwife

## 2015-10-09 VITALS — BP 119/48 | HR 89 | Wt 196.6 lb

## 2015-10-09 DIAGNOSIS — O479 False labor, unspecified: Secondary | ICD-10-CM

## 2015-10-09 DIAGNOSIS — Z3483 Encounter for supervision of other normal pregnancy, third trimester: Secondary | ICD-10-CM

## 2015-10-09 DIAGNOSIS — Z3A33 33 weeks gestation of pregnancy: Secondary | ICD-10-CM

## 2015-10-09 DIAGNOSIS — O4703 False labor before 37 completed weeks of gestation, third trimester: Secondary | ICD-10-CM

## 2015-10-09 LAB — POCT URINALYSIS DIP (DEVICE)
BILIRUBIN URINE: NEGATIVE
GLUCOSE, UA: NEGATIVE mg/dL
HGB URINE DIPSTICK: NEGATIVE
Ketones, ur: NEGATIVE mg/dL
LEUKOCYTES UA: NEGATIVE
NITRITE: NEGATIVE
Protein, ur: NEGATIVE mg/dL
Urobilinogen, UA: 0.2 mg/dL (ref 0.0–1.0)
pH: 6 (ref 5.0–8.0)

## 2015-10-09 NOTE — Progress Notes (Signed)
Subjective:  Lacey Sanders is a 30 y.o. G2P1001 at 6318w6d being seen today for ongoing prenatal care.  She is currently monitored for the following issues for this low-risk pregnancy and has Pain in joint, pelvic region and thigh; RLQ abdominal pain; IBS (irritable bowel syndrome); Dizziness and giddiness; GERD (gastroesophageal reflux disease); Environmental allergies; Weight gain; Irregular menstrual cycle; Allergic rhinitis; Pap smear for cervical cancer screening; Supervision of normal pregnancy, antepartum; and Previous cesarean delivery affecting pregnancy, antepartum on her problem list.  Patient reports rare contractions, no change over several weeks.  Contractions: Not present. Vag. Bleeding: None.  Movement: Present. Denies leaking of fluid.   The following portions of the patient's history were reviewed and updated as appropriate: allergies, current medications, past family history, past medical history, past social history, past surgical history and problem list. Problem list updated.  Objective:   Vitals:   10/09/15 1556  BP: (!) 119/48  Pulse: 89  Weight: 196 lb 9.6 oz (89.2 kg)    Fetal Status: Fetal Heart Rate (bpm): 162 Fundal Height: 34 cm Movement: Present     General:  Alert, oriented and cooperative. Patient is in no acute distress.  Skin: Skin is warm and dry. No rash noted.   Cardiovascular: Normal heart rate noted  Respiratory: Normal respiratory effort, no problems with respiration noted  Abdomen: Soft, gravid, appropriate for gestational age. Pain/Pressure: Present     Pelvic:  declined        Extremities: Normal range of motion.  Edema: Trace  Mental Status: Normal mood and affect. Normal behavior. Normal judgment and thought content.   Urinalysis:      Assessment and Plan:  Pregnancy: G2P1001 at 3318w6d  1. Braxton Hicks contractions - Increase fluids and rest  Preterm labor symptoms and general obstetric precautions including but not limited  to vaginal bleeding, contractions, leaking of fluid and fetal movement were reviewed in detail with the patient. Please refer to After Visit Summary for other counseling recommendations.  Return in about 2 weeks (around 10/23/2015).   Dorathy KinsmanVirginia Janda Cargo, CNM

## 2015-10-29 ENCOUNTER — Ambulatory Visit (INDEPENDENT_AMBULATORY_CARE_PROVIDER_SITE_OTHER): Payer: Medicaid Other | Admitting: Obstetrics & Gynecology

## 2015-10-29 VITALS — BP 131/69 | HR 92 | Wt 202.9 lb

## 2015-10-29 DIAGNOSIS — Z3493 Encounter for supervision of normal pregnancy, unspecified, third trimester: Secondary | ICD-10-CM

## 2015-10-29 DIAGNOSIS — Z113 Encounter for screening for infections with a predominantly sexual mode of transmission: Secondary | ICD-10-CM | POA: Diagnosis not present

## 2015-10-29 DIAGNOSIS — Z3483 Encounter for supervision of other normal pregnancy, third trimester: Secondary | ICD-10-CM

## 2015-10-29 LAB — POCT URINALYSIS DIP (DEVICE)
Bilirubin Urine: NEGATIVE
GLUCOSE, UA: NEGATIVE mg/dL
Ketones, ur: NEGATIVE mg/dL
NITRITE: NEGATIVE
PROTEIN: NEGATIVE mg/dL
SPECIFIC GRAVITY, URINE: 1.015 (ref 1.005–1.030)
UROBILINOGEN UA: 0.2 mg/dL (ref 0.0–1.0)
pH: 7 (ref 5.0–8.0)

## 2015-10-29 LAB — OB RESULTS CONSOLE GC/CHLAMYDIA: Gonorrhea: NEGATIVE

## 2015-10-29 LAB — OB RESULTS CONSOLE GBS: GBS: POSITIVE

## 2015-10-29 NOTE — Progress Notes (Signed)
Video interpreter [phone] 36 wk cultures Flu vaccine today

## 2015-10-29 NOTE — Patient Instructions (Signed)
Return to clinic for any scheduled appointments or obstetric concerns, or go to [facility] for evaluation  

## 2015-10-29 NOTE — Progress Notes (Signed)
   PRENATAL VISIT NOTE  Subjective:  Lacey Sanders is a 30 y.o. G2P1001 at 6110w5d being seen today for ongoing prenatal care.  She is currently monitored for the following issues for this low-risk pregnancy and has IBS (irritable bowel syndrome); Dizziness and giddiness; GERD (gastroesophageal reflux disease); Environmental allergies; Irregular menstrual cycle; Allergic rhinitis; Supervision of normal pregnancy, antepartum; and Previous cesarean delivery affecting pregnancy, antepartum on her problem list.  Patient reports occasional contractions.  Patient is Spanish-speaking only, Spanish interpreter present for this encounter.  Contractions: Irritability. Vag. Bleeding: None.  Movement: Present. Denies leaking of fluid.   The following portions of the patient's history were reviewed and updated as appropriate: allergies, current medications, past family history, past medical history, past social history, past surgical history and problem list. Problem list updated.  Objective:   Vitals:   10/29/15 1518  BP: 131/69  Pulse: 92  Weight: 202 lb 14.4 oz (92 kg)    Fetal Status: Fetal Heart Rate (bpm): 152 Fundal Height: 37 cm Movement: Present  Presentation: Vertex  General:  Alert, oriented and cooperative. Patient is in no acute distress.  Skin: Skin is warm and dry. No rash noted.   Cardiovascular: Normal heart rate noted  Respiratory: Normal respiratory effort, no problems with respiration noted  Abdomen: Soft, gravid, appropriate for gestational age. Pain/Pressure: Present     Pelvic:  Cervical exam performed Dilation: Closed Effacement (%): Thick Station: Ballotable  Extremities: Normal range of motion.  Edema: Trace  Mental Status: Normal mood and affect. Normal behavior. Normal judgment and thought content.   Urinalysis: Urine Protein: Negative Urine Glucose: Negative  Assessment and Plan:  Pregnancy: G2P1001 at 8010w5d  1. Supervision of normal pregnancy in third  trimester Pelvic cultures done today. Still planning on TOLAC. Flu vaccine given - Culture, beta strep (group b only) - GC[facility]Chlamydia [facility] Preterm labor symptoms and general obstetric precautions including but not limited to vaginal bleeding, contractions, leaking of fluid and fetal movement were reviewed in detail with the patient. Please refer to After Visit Summary for other counseling recommendations.  Return in about 1 week (around 11/05/2015) for OB Visit.  Tereso NewcomerUgonna A Caesar Mannella, MD

## 2015-10-30 ENCOUNTER — Encounter: Payer: Self-pay | Admitting: Obstetrics & Gynecology

## 2015-10-30 DIAGNOSIS — O99820 Streptococcus B carrier state complicating pregnancy: Secondary | ICD-10-CM | POA: Insufficient documentation

## 2015-10-30 LAB — CULTURE, BETA STREP (GROUP B ONLY)

## 2015-10-30 LAB — GC/CHLAMYDIA PROBE AMP (~~LOC~~) NOT AT ARMC
Chlamydia: NEGATIVE
Neisseria Gonorrhea: NEGATIVE

## 2015-11-12 ENCOUNTER — Ambulatory Visit (INDEPENDENT_AMBULATORY_CARE_PROVIDER_SITE_OTHER): Payer: Self-pay | Admitting: Family

## 2015-11-12 VITALS — BP 113/62 | HR 87 | Wt 206.8 lb

## 2015-11-12 DIAGNOSIS — O99820 Streptococcus B carrier state complicating pregnancy: Secondary | ICD-10-CM

## 2015-11-12 DIAGNOSIS — Z3493 Encounter for supervision of normal pregnancy, unspecified, third trimester: Secondary | ICD-10-CM

## 2015-11-12 DIAGNOSIS — O34219 Maternal care for unspecified type scar from previous cesarean delivery: Secondary | ICD-10-CM

## 2015-11-12 DIAGNOSIS — Z22330 Carrier of Group B streptococcus: Secondary | ICD-10-CM

## 2015-11-12 DIAGNOSIS — Z3483 Encounter for supervision of other normal pregnancy, third trimester: Secondary | ICD-10-CM

## 2015-11-12 NOTE — Progress Notes (Signed)
   PRENATAL VISIT NOTE  Subjective:  Lacey Sanders is a 30 y.o. G2P1001 at 5028w5d being seen today for ongoing prenatal care.  She is currently monitored for the following issues for this low-risk pregnancy and has IBS (irritable bowel syndrome); Dizziness and giddiness; GERD (gastroesophageal reflux disease); Environmental allergies; Irregular menstrual cycle; Allergic rhinitis; Supervision of normal pregnancy, antepartum; Previous cesarean delivery affecting pregnancy, antepartum; and Group B Streptococcus carrier, +RV culture, currently pregnant on her problem list.  Patient reports lower back pressure.  Contractions: Irregular. Vag. Bleeding: None.  Movement: Present. Denies leaking of fluid.   The following portions of the patient's history were reviewed and updated as appropriate: allergies, current medications, past family history, past medical history, past social history, past surgical history and problem list. Problem list updated.  Objective:   Vitals:   11/12/15 0759  BP: 113/62  Pulse: 87  Weight: 206 lb 12.8 oz (93.8 kg)    Fetal Status: Fetal Heart Rate (bpm): 142 Fundal Height: 38 cm Movement: Present  Presentation: Vertex  General:  Alert, oriented and cooperative. Patient is in no acute distress.  Skin: Skin is warm and dry. No rash noted.   Cardiovascular: Normal heart rate noted  Respiratory: Normal respiratory effort, no problems with respiration noted  Abdomen: Soft, gravid, appropriate for gestational age. Pain/Pressure: Present     Pelvic:  Cervical exam deferred        Extremities: Normal range of motion.  Edema: Trace  Mental Status: Normal mood and affect. Normal behavior. Normal judgment and thought content.   Urinalysis:      Assessment and Plan:  Pregnancy: G2P1001 at 7028w5d  1. Supervision of normal pregnancy, third trimester - Reviewed +GBS results  2. Previous cesarean delivery affecting pregnancy, antepartum - Confirmed patient  desires TOLAC  3.  Group B Streptococcus carrier, +RV culture, currently pregnant - Reviewed with patient implication (IV antibiotics in labor)  Term labor symptoms and general obstetric precautions including but not limited to vaginal bleeding, contractions, leaking of fluid and fetal movement were reviewed in detail with the patient. Please refer to After Visit Summary for other counseling recommendations.  Return in 1 week (on 11/19/2015).  Eino FarberWalidah Kennith GainN Karim, CNM

## 2015-11-12 NOTE — Progress Notes (Signed)
Used Interpreter Blanca Lindner.  

## 2015-11-12 NOTE — Patient Instructions (Signed)
Tercer trimestre de embarazo (Third Trimester of Pregnancy) El tercer trimestre comprende desde la semana29 hasta la semana42, es decir, desde el mes7 hasta el mes9. El tercer trimestre es un perodo en el que el feto crece rpidamente. Hacia el final del noveno mes, el feto mide alrededor de 20pulgadas (45cm) de largo y pesa entre 6 y 10 libras (2,700 y 4,500kg).  CAMBIOS EN EL ORGANISMO Su organismo atraviesa por muchos cambios durante el embarazo, y estos varan de una mujer a otra.   Seguir aumentando de peso. Es de esperar que aumente entre 25 y 35libras (11 y 16kg) hacia el final del embarazo.  Podrn aparecer las primeras estras en las caderas, el abdomen y las mamas.  Puede tener necesidad de orinar con ms frecuencia porque el feto baja hacia la pelvis y ejerce presin sobre la vejiga.  Debido al embarazo podr sentir acidez estomacal con frecuencia.  Puede estar estreida, ya que ciertas hormonas enlentecen los movimientos de los msculos que empujan los desechos a travs de los intestinos.  Pueden aparecer hemorroides o abultarse e hincharse las venas (venas varicosas).  Puede sentir dolor plvico debido al aumento de peso y a que las hormonas del embarazo relajan las articulaciones entre los huesos de la pelvis. El dolor de espalda puede ser consecuencia de la sobrecarga de los msculos que soportan la postura.  Tal vez haya cambios en el cabello que pueden incluir su engrosamiento, crecimiento rpido y cambios en la textura. Adems, a algunas mujeres se les cae el cabello durante o despus del embarazo, o tienen el cabello seco o fino. Lo ms probable es que el cabello se le normalice despus del nacimiento del beb.  Las mamas seguirn creciendo y le dolern. A veces, puede haber una secrecin amarilla de las mamas llamada calostro.  El ombligo puede salir hacia afuera.  Puede sentir que le falta el aire debido a que se expande el tero.  Puede notar que el feto  "baja" o lo siente ms bajo, en el abdomen.  Puede tener una prdida de secrecin mucosa con sangre. Esto suele ocurrir en el trmino de unos pocos das a una semana antes de que comience el trabajo de parto.  El cuello del tero se vuelve delgado y blando (se borra) cerca de la fecha de parto. QU DEBE ESPERAR EN LOS EXMENES PRENATALES  Le harn exmenes prenatales cada 2semanas hasta la semana36. A partir de ese momento le harn exmenes semanales. Durante una visita prenatal de rutina:  La pesarn para asegurarse de que usted y el feto estn creciendo normalmente.  Le tomarn la presin arterial.  Le medirn el abdomen para controlar el desarrollo del beb.  Se escucharn los latidos cardacos fetales.  Se evaluarn los resultados de los estudios solicitados en visitas anteriores.  Le revisarn el cuello del tero cuando est prxima la fecha de parto para controlar si este se ha borrado. Alrededor de la semana36, el mdico le revisar el cuello del tero. Al mismo tiempo, realizar un anlisis de las secreciones del tejido vaginal. Este examen es para determinar si hay un tipo de bacteria, estreptococo Grupo B. El mdico le explicar esto con ms detalle. El mdico puede preguntarle lo siguiente:  Cmo le gustara que fuera el parto.  Cmo se siente.  Si siente los movimientos del beb.  Si ha tenido sntomas anormales, como prdida de lquido, sangrado, dolores de cabeza intensos o clicos abdominales.  Si est consumiendo algn producto que contenga tabaco, como cigarrillos, tabaco   de mascar y cigarrillos electrnicos.  Si tiene alguna pregunta. Otros exmenes o estudios de deteccin que pueden realizarse durante el tercer trimestre incluyen lo siguiente:  Anlisis de sangre para controlar los niveles de hierro (anemia).  Controles fetales para determinar su salud, nivel de actividad y crecimiento. Si tiene alguna enfermedad o hay problemas durante el embarazo, le harn  estudios.  Prueba del VIH (virus de inmunodeficiencia humana). Si corre un riesgo alto, pueden realizarle una prueba de deteccin del VIH durante el tercer trimestre del embarazo. FALSO TRABAJO DE PARTO Es posible que sienta contracciones leves e irregulares que finalmente desaparecen. Se llaman contracciones de Braxton Hicks o falso trabajo de parto. Las contracciones pueden durar horas, das o incluso semanas, antes de que el verdadero trabajo de parto se inicie. Si las contracciones ocurren a intervalos regulares, se intensifican o se hacen dolorosas, lo mejor es que la revise el mdico.  SIGNOS DE TRABAJO DE PARTO   Clicos de tipo menstrual.  Contracciones cada 5minutos o menos.  Contracciones que comienzan en la parte superior del tero y se extienden hacia abajo, a la zona inferior del abdomen y la espalda.  Sensacin de mayor presin en la pelvis o dolor de espalda.  Una secrecin de mucosidad acuosa o con sangre que sale de la vagina. Si tiene alguno de estos signos antes de la semana37 del embarazo, llame a su mdico de inmediato. Debe concurrir al hospital para que la controlen inmediatamente. INSTRUCCIONES PARA EL CUIDADO EN EL HOGAR   Evite fumar, consumir hierbas, beber alcohol y tomar frmacos que no le hayan recetado. Estas sustancias qumicas afectan la formacin y el desarrollo del beb.  No consuma ningn producto que contenga tabaco, lo que incluye cigarrillos, tabaco de mascar y cigarrillos electrnicos. Si necesita ayuda para dejar de fumar, consulte al mdico. Puede recibir asesoramiento y otro tipo de recursos para dejar de fumar.  Siga las indicaciones del mdico en relacin con el uso de medicamentos. Durante el embarazo, hay medicamentos que son seguros de tomar y otros que no.  Haga ejercicio solamente como se lo haya indicado el mdico. Sentir clicos uterinos es un buen signo para detener la actividad fsica.  Contine comiendo alimentos sanos con  regularidad.  Use un sostn que le brinde buen soporte si le duelen las mamas.  No se d baos de inmersin en agua caliente, baos turcos ni saunas.  Use el cinturn de seguridad en todo momento mientras conduce.  No coma carne cruda ni queso sin cocinar; evite el contacto con las bandejas sanitarias de los gatos y la tierra que estos animales usan. Estos elementos contienen grmenes que pueden causar defectos congnitos en el beb.  Tome las vitaminas prenatales.  Tome entre 1500 y 2000mg de calcio diariamente comenzando en la semana20 del embarazo hasta el parto.  Si est estreida, pruebe un laxante suave (si el mdico lo autoriza). Consuma ms alimentos ricos en fibra, como vegetales y frutas frescos y cereales integrales. Beba gran cantidad de lquido para mantener la orina de tono claro o color amarillo plido.  Dese baos de asiento con agua tibia para aliviar el dolor o las molestias causadas por las hemorroides. Use una crema para las hemorroides si el mdico la autoriza.  Si tiene venas varicosas, use medias de descanso. Eleve los pies durante 15minutos, 3 o 4veces por da. Limite el consumo de sal en su dieta.  Evite levantar objetos pesados, use zapatos de tacones bajos y mantenga una buena postura.  Descanse   con las piernas elevadas si tiene calambres o dolor de cintura.  Visite a su dentista si no lo ha Occupational hygienisthecho durante el embarazo. Use un cepillo de dientes blando para higienizarse los dientes y psese el hilo dental con suavidad.  Puede seguir Calpine Corporationmanteniendo relaciones sexuales, a menos que el mdico le indique lo contrario.  No haga viajes largos excepto que sea absolutamente necesario y solo con la autorizacin del mdico.  Tome clases prenatales para Financial traderentender, Education administratorpracticar y hacer preguntas sobre el [place]trabajo de parto y [place] parto.  Haga un ensayo de la partida al hospital.  Prepare el bolso que llevar al hospital.  Prepare la habitacin del beb.  Concurra a todas  las visitas prenatales segn las indicaciones de su mdico. SOLICITE ATENCIN MDICA SI:  No est segura de que est en trabajo de parto o de que ha roto la bolsa de las aguas.  Tiene mareos.  Siente clicos leves, presin en la pelvis o dolor persistente en el abdomen.  Tiene nuseas, vmitos o diarrea persistentes.  Brett Fairybserva una secrecin vaginal con mal olor.  Siente dolor al ConocoPhillipsorinar. SOLICITE ATENCIN MDICA DE INMEDIATO SI:   Tiene fiebre.  Tiene una prdida de lquido por la vagina.  Tiene sangrado o pequeas prdidas vaginales.  Siente dolor intenso o clicos en el abdomen.  Sube o baja de peso rpidamente.  Tiene dificultad para respirar y siente dolor de pecho.  Sbitamente se le hinchan mucho el rostro, las [place]manos, los tobillos, los pies o las piernas.  No ha sentido los movimientos del beb durante Georgianne Fickuna hora.  Siente un dolor de cabeza intenso que no se alivia con medicamentos.  Su visin se modifica.   Esta informacin no tiene Theme park managercomo fin reemplazar el consejo del mdico. Asegrese de hacerle al mdico cualquier pregunta que tenga.   Document Released: 11/13/2004 Document Revised: 02/24/2014 Elsevier Interactive Patient Education 2016 [employer]  (Group B Streptococcus Infection During Pregnancy) El estreptococo del grupo B (GBS, por sus siglas en ingls) es un tipo de bacteria que se encuentra en las mujeres sanas. No es el mismo que la bacteria que causa faringitis estreptoccica. Puede tener estreptococo del grupoB en la vagina, el recto o la vejiga. El estreptococo del grupoB no se transmite por contacto sexual; sin embargo, un beb puede contagiarse durante el parto, lo que puede ser peligroso para Engineer, maintenance (IT)el nio. El estreptococo del grupoB no es peligroso para usted y, generalmente, no provoca sntomas. El mdico puede hacerle anlisis de deteccin del estreptococo del grupoB entre la semana 35 y la 37de Psychiatristembarazo. Esta bacteria es peligrosa solamente durante  el parto, de modo que no es necesario hacer pruebas de deteccin con anterioridad. Es posible Nurse, children'stener estreptococo del grupoB durante el embarazo y no transmitrselo nunca al beb. Si los resultados de los anlisis de deteccin del estreptococo del grupoB son positivos, el mdico puede recomendar que se le administren antibiticos durante el parto para asegurarse de que el beb est sano. FACTORES DE RIESGO Tiene ms probabilidades de transmitirle el estreptococo del grupoB al beb si:   Se le rompe la bolsa de aguas (ruptura de Ernestmembranas) o si el trabajo de parto comienza antes de las 37semanas.  Se le rompe la bolsa 18horas antes del parto.  Transmiti el estreptococo del grupoB en un embarazo anterior.  Tiene una infeccin de las vas urinarias causada por el estreptococo del grupoB en cualquier momento durante el embarazo.  Tiene fiebre durante el [place]trabajo de [place]parto. SNTOMAS La Harley-Davidsonmayora de las  mujeres que tienen estreptococo del grupoB no presentan sntomas. Si tiene una infeccin de las vas urinarias causada por el estreptococo del grupoB, es posible que orine con frecuencia o que tenga dolor al Geographical information systems officerorinar y [place] Unionfiebre. Generalmente, los bebs que tienen estreptococo del grupoB presentan sntomas en el trmino de los 7das posteriores al nacimiento. Los sntomas pueden incluir:   Problemas respiratorios.  Problemas cardacos y de presin arterial.  Problemas digestivos y renales. DIAGNSTICO Se recomienda que todas las embarazadas se hagan exmenes de rutina para la deteccin del estreptococo del grupoB. El mdico toma [place]una muestra de secrecin de la vagina y el recto con un hisopo, que luego se enva al laboratorio para Landscape architectdetectar la presencia de estreptococo del grupoB. Tambin pueden tomarle una muestra de orina para controlar si hay bacterias.  TRATAMIENTO Si el resultado de los ARAMARK Corporationanlisis de deteccin del estreptococo del grupoB es positivo, tal vez deba recibir tratamiento con un  antibitico durante el [place] Granttrabajo de Petronilaparto. En cuanto comience el trabajo de parto o se produzca la ruptura de las Laceymembranas, recibir el antibitico a travs de una va [place]intravenosa. Seguir recibiendo Regions Financial Corporationeste medicamento hasta despus del [place]parto. No necesita antibiticos si el parto es por cesrea. Si el beb muestra signos o sntomas de estreptococo del grupoB despus del parto, tambin puede recibir tratamiento con antibiticos. INSTRUCCIONES PARA EL CUIDADO EN EL HOGAR   Tome todos los medicamentos tal como el Bed Bath & Beyondmdico le recet. Tome los medicamentos solamente como se lo hayan indicado.  Contine con las visitas y cuidados prenatales.  Cumpla con todas las visitas de control. SOLICITE ATENCIN MDICA SI:   Siente dolor al ConocoPhillipsorinar.  Tiene necesidad de orinar con frecuencia.  Tiene fiebre. SOLICITE ATENCIN MDICA DE INMEDIATO SI:   Se produce la ruptura de las [place]membranas.  Comienza el trabajo de [place]o.   Esta informacin no tiene Theme park managercomo fin reemplazar el consejo del mdico. Asegrese de hacerle al mdico cualquier pregunta que tenga.   Document Released: 07/23/2007 Document Revised: 02/08/2013 Elsevier Interactive Patient Education [employer]2016 Elsevier Inc.

## 2015-11-14 ENCOUNTER — Encounter (HOSPITAL_COMMUNITY): Payer: Self-pay

## 2015-11-14 ENCOUNTER — Inpatient Hospital Stay (HOSPITAL_COMMUNITY)
Admission: AD | Admit: 2015-11-14 | Discharge: 2015-11-14 | Disposition: A | Discharge status: Home / Self Care | Payer: Self-pay | Source: Ambulatory Visit | Attending: Obstetrics and Gynecology | Admitting: Obstetrics and Gynecology

## 2015-11-14 DIAGNOSIS — O471 False labor at or after 37 completed weeks of gestation: Secondary | ICD-10-CM | POA: Insufficient documentation

## 2015-11-14 DIAGNOSIS — Z3A39 39 weeks gestation of pregnancy: Secondary | ICD-10-CM | POA: Insufficient documentation

## 2015-11-14 NOTE — Discharge Instructions (Signed)
Contracciones de Braxton Hicks °(Braxton Hicks Contractions) °Durante el embarazo, pueden presentarse contracciones uterinas que no siempre indican que está en trabajo de parto.  °¿QUÉ SON LAS CONTRACCIONES DE BRAXTON HICKS?  °Las contracciones que se presentan antes del trabajo de parto se conocen como contracciones de Braxton Hicks o falso trabajo de parto. Hacia el final del embarazo (32 a 34 semanas), estas contracciones pueden aparecen con más frecuencia y volverse más intensas. No corresponden al trabajo de parto verdadero porque estas contracciones no producen el agrandamiento (la dilatación) y el afinamiento del cuello del útero. Algunas veces, es difícil distinguirlas del trabajo de parto verdadero porque en algunos casos pueden ser muy intensas, y las personas tienen diferentes niveles de tolerancia al dolor. No debe sentirse avergonzada si concurre al hospital con falso trabajo de parto. En ocasiones, la única forma de saber si el trabajo de parto es verdadero es que el médico determine si hay cambios en el cuello del útero. °Si no hay problemas prenatales u otras complicaciones de salud asociadas con el embarazo, no habrá inconvenientes si la envían a su casa con falso trabajo de parto y espera que comience el verdadero. °CÓMO DIFERENCIAR EL TRABAJO DE PARTO FALSO DEL VERDADERO °Falso trabajo de parto °· Las contracciones del falso trabajo de parto duran menos y no son tan intensas como las verdaderas. °· Generalmente son irregulares. °· A menudo, se sienten en la parte delantera de la parte baja del abdomen y en la ingle, °· y pueden desaparecer cuando camina o cambia de posición mientras está acostada. °· Las contracciones se vuelven más débiles y su duración es menor a medida que el tiempo transcurre. °· Por lo general, no se hacen progresivamente más intensas, regulares y cercanas entre sí como en el caso del trabajo de parto verdadero. °Verdadero trabajo de parto °· Las contracciones del verdadero  trabajo de parto duran de 30 a 70 segundos, son muy regulares y suelen volverse más intensas, y aumenta su frecuencia. °· No desaparecen cuando camina. °· La molestia generalmente se siente en la parte superior del útero y se extiende hacia la zona inferior del abdomen y hacia la cintura. °· El médico podrá examinarla para determinar si el trabajo de parto es verdadero. El examen mostrará si el cuello del útero se está dilatando y afinando. °LO QUE DEBE RECORDAR °· Continúe haciendo los ejercicios habituales y siga otras indicaciones que el médico le dé. °· Tome todos los medicamentos como le indicó el médico. °· Concurra a las visitas prenatales regulares. °· Coma y beba con moderación si cree que está en trabajo de parto. °· Si las contracciones de Braxton Hicks le provocan incomodidad: °¨ Cambie de posición: si está acostada o descansando, camine; si está caminando, descanse. °¨ Siéntese y descanse en una bañera con agua tibia. °¨ Beba 2 o 3 vasos de agua. La deshidratación puede provocar contracciones. °¨ Respire lenta y profundamente varias veces por hora. °¿CUÁNDO DEBO BUSCAR ASISTENCIA MÉDICA INMEDIATA? °Solicite atención médica de inmediato si: °· Las contracciones se intensifican, se hacen más regulares y cercanas entre sí. °· Tiene una pérdida de líquido por la vagina. °· Tiene fiebre. °· Elimina mucosidad manchada con sangre. °· Tiene una hemorragia vaginal abundante. °· Tiene dolor abdominal permanente. °· Tiene un dolor en la zona lumbar que nunca tuvo antes. °· Siente que la cabeza del bebé empuja hacia abajo y ejerce presión en la zona pélvica. °· El bebé no se mueve tanto como solía. °  °Esta información no tiene como fin   reemplazar el consejo del médico. Asegúrese de hacerle al médico cualquier pregunta que tenga. °  °Document Released: 11/13/2004 Document Revised: 02/08/2013 °Elsevier Interactive Patient Education ©2016 Elsevier Inc. ° °

## 2015-11-14 NOTE — MAU Note (Signed)
Patient presents back pain and ctxs that started this morning. Patient denies any bleeding or LOF. Fetus active.

## 2015-11-19 ENCOUNTER — Ambulatory Visit (INDEPENDENT_AMBULATORY_CARE_PROVIDER_SITE_OTHER): Payer: Self-pay | Admitting: Family Medicine

## 2015-11-19 VITALS — BP 119/68 | HR 82 | Wt 207.9 lb

## 2015-11-19 DIAGNOSIS — O99820 Streptococcus B carrier state complicating pregnancy: Secondary | ICD-10-CM

## 2015-11-19 DIAGNOSIS — Z3483 Encounter for supervision of other normal pregnancy, third trimester: Secondary | ICD-10-CM

## 2015-11-19 DIAGNOSIS — Z3480 Encounter for supervision of other normal pregnancy, unspecified trimester: Secondary | ICD-10-CM

## 2015-11-19 DIAGNOSIS — O34219 Maternal care for unspecified type scar from previous cesarean delivery: Secondary | ICD-10-CM

## 2015-11-19 NOTE — Progress Notes (Signed)
Used Web designerVideo interpreter Marlon [phone].  C/o pain in lower back, felt like she heard it creak.

## 2015-11-19 NOTE — Progress Notes (Signed)
Subjective:  Lacey Sanders is a 30 y.o. G2P1001 at 6458w5d being seen today for ongoing prenatal care.  She is currently monitored for the following issues for this low-risk pregnancy and has IBS (irritable bowel syndrome); Dizziness and giddiness; GERD (gastroesophageal reflux disease); Environmental allergies; Irregular menstrual cycle; Allergic rhinitis; Supervision of normal pregnancy, antepartum; Previous cesarean delivery affecting pregnancy, antepartum; and Group B Streptococcus carrier, +RV culture, currently pregnant on her problem list.  Patient reports no complaints.  Contractions: Irregular. Vag. Bleeding: None.  Movement: Present. Denies leaking of fluid.   The following portions of the patient's history were reviewed and updated as appropriate: allergies, current medications, past family history, past medical history, past social history, past surgical history and problem list. Problem list updated.  Objective:   Vitals:   11/19/15 1259  BP: 119/68  Pulse: 82  Weight: 207 lb 14.4 oz (94.3 kg)    Fetal Status: Fetal Heart Rate (bpm): 146 Fundal Height: 40 cm Movement: Present  Presentation: Vertex  General:  Alert, oriented and cooperative. Patient is in no acute distress.  Skin: Skin is warm and dry. No rash noted.   Cardiovascular: Normal heart rate noted  Respiratory: Normal respiratory effort, no problems with respiration noted  Abdomen: Soft, gravid, appropriate for gestational age. Pain/Pressure: Present     Pelvic:  Cervical exam performed  Dilation: Closed Effacement (%): 30 Station: Ballotable  Extremities: Normal range of motion.  Edema: Trace  Mental Status: Normal mood and affect. Normal behavior. Normal judgment and thought content.   Urinalysis:      Assessment and Plan:  Pregnancy: G2P1001 at 5258w5d  1. Supervision of other normal pregnancy, antepartum - GBS Positive - TOLAC - To have NST performed in 1 week for postdates. - Dating looks to  have been documented based on 20 wk US due to unsure LMP (9 day difference).  2. Previous cesarean delivery affecting pregnancy, antepartum - TOLAC, reviewed risks again with patient, she understands and would still like to proceed. Consent in chart.  3. Group B Streptococcus carrier, +RV culture, currently pregnant - PCN during labor  Term labor symptoms and general obstetric precautions including but not limited to vaginal bleeding, contractions, leaking of fluid and fetal movement were reviewed in detail with the patient. Please refer to After Visit Summary for other counseling recommendations.  Return in about 1 week (around 11/26/2015) for NST x1 before next visit (postdates); 1 week f/u OB appt.   [facility] SpringfieldElizabeth Woodland Sanders, OhioDO

## 2015-11-19 NOTE — Progress Notes (Signed)
Addendum:  5:31pm Scheduled for IOL for 11/28/15 at 0730.

## 2015-11-19 NOTE — Patient Instructions (Signed)
Induccin del trabajo de parto  (Labor Induction) Se denomina induccin del trabajo de parto cuando se inician acciones para hacer que una mujer embarazada comience el trabajo de parto. La mayora de las mujeres comienzan el trabajo de parto sin ayuda entre las semanas 37 y 42 del embarazo. Cuando esto no ocurre o cuando hay una necesidad mdica, pueden utilizarse diferentes mtodos para inducirlo. La induccin del trabajo de parto hace que el tero se contraiga. Tambin hace que el cuello del tero se ablandemadure), se abra (se dilate), y se afine (se borre). Generalmente el trabajo de parto no se induce antes de las 39 semanas excepto que haya un problema con el beb o con la madre.  Antes de inducir el trabajo de parto, el mdico considerar cierto nmero de factores incluyendo los siguientes:  El estado del beb.  Cuntas semanas tiene de embarazo.  La madurez de los pulmones del beb.  El estado del cuello del tero.  La posicin del beb. CULES SON LOS MOTIVOS PARA INDUCIR UN PARTO? El trabajo de parto puede inducirse por las siguientes razones:  La salud del beb o de la madre estn en riesgo.  El embarazo se ha pasado de trmino en 1 semana o ms.  Ha roto la bolsa de aguas pero no se ha iniciado el trabajo de parto por s mismo.  La madre tiene algn trastorno de salud o una enfermedad grave, como hipertensin arterial, una infeccin, desprendimiento abrupto de la placenta o diabetes.  Hay escaso lquido amnitico alrededor del beb.  El beb presenta sufrimiento. La conveniencia o el deseo de que el beb nazca en una cierta fecha no es un motivo para inducir el parto. CULES SON LOS MTODOS UTILIZADOS PARA INDUCIR EL TRABAJO DE PARTO? Algunos mtodos de induccin del trabajo de parto son:   Administracin del medicamentos prostaglandina. Este medicamento hace que el cuello uterino se dilate y madure. Este medicamento tambin iniciar las contracciones. Puede tomarse por  boca o insertarse en la vagina en forma de supositorio.  Insercin en la vagina de un tubo delgado (catter) con un baln en el extremo para dilatar el cuello del tero. Una vez insertado, el baln se infla con agua, lo que provoca la apertura del cuello del tero.  Ruptura de las membranas. El mdico separa el saco amnitico del cuello uterino, haciendo que el cuello uterino se distienda y cause la liberacin de la hormona llamada progesterona. Esto hace que el tero se contraiga. Este procedimiento se realiza durante una visita al consultorio mdico. Le indicarn que vuelva a su casa y espere que se inicien las contracciones. Luego tendr que volver para la induccin.  Ruptura de la bolsa de aguas. El mdico romper el saco amnitico con un pequeo instrumento. Una vez que el saco amnitico se rompe, las contracciones deben comenzar. Pueden pasar algunas horas hasta que haga efecto.  Medicamentos que desencadenen o intensifiquen las contracciones. Se lo administrarn a travs de un catter por va intravenosa (IV) que se inserta en una de las venas del brazo. Todos los mtodos de induccin, excepto la ruptura de membranas, se realizan en el hospital. La induccin se realizar en el hospital, de modo que usted y el beb puedan ser controlados cuidadosamente.  CUNTO TIEMPO LLEVA INDUCIR EL TRABAJO DE PARTO? Algunas inducciones pueden demorar entre 2 y 3 das. Generalmente lleva menos tiempo, dependiendo del estado del cuello del tero. Puede tomar ms tiempo si la induccin se realiza en etapas tempranas del embarazo o   es Contractor. Si han pasado 2 o [address] y no se inicia el trabajo de [place], podrn enviarla a su casa o Magazine features editor cesrea. CULES SON LOS RIESGOS ASOCIADOS CON LA INDUCCiN DEL TRABAJO DE PARTO? Algunos de los riesgos de la induccin son:   Cambios en la frecuencia cardaca fetal, por ejemplo los latidos son demasiado rpidos, o lentos, o errticos.  Riesgo de distrs  fetal.  Posibilidad de infeccin en la madre o el beb.  Aumento de la posibilidad de que sea necesaria una cesrea.  Ruptura (abrupcin) de la placenta del tero (raro).  Ruptura uterina (muy raro). Cuando es Passenger transport manager la induccin por razones mdicas, los beneficios deben superar a los [employer]. CULES SON ALGUNAS RAZONES PARA NO INDUCIR EL TRABAJO DE PARTO? La induccin no debe realizarse si:   Se demuestra que el beb no tolera el trabajo de [place].  Fue sometida anteriormente a Personnel officer, como una miomectoma o le han extirpado fibromas.  La placenta est en una posicin muy baja en el tero y obstruye la abertura del cuello (placenta previa).  El beb no est ubicado con la [employer].  El cordn umbilical cae hacia el canal de parto, adelante del beb. Esto puede cortar el suministro de [place] y oxgeno al beb.  Fue sometida a Higher education careers adviser.  Hay circunstancias poco habituales, como que el beb es Doctor, general practice.   Esta informacin no tiene Theme park manager el consejo del mdico. Asegrese de hacerle al mdico cualquier pregunta que tenga.   Document Released: 05/13/2007 Document Revised: 02/24/2014 Elsevier Interactive Patient Education 2016 [employer].    Systems analyst trimestre de Psychiatrist (Third Trimester of Pregnancy) El tercer trimestre comprende desde la semana29 [employer] semana42, es decir, desde el mes7 hasta el [address] En este trimestre, el feto crece muy rpido. Hacia el final del noveno mes, el feto mide alrededor de 20pulgadas (45cm) de largo y pesa entre 6y 10libras [phone]).  CUIDADOS EN EL HOGAR   No fume, no consuma hierbas ni beba alcohol. No tome frmacos que el mdico no haya autorizado.  No consuma ningn producto que contenga tabaco, lo que incluye cigarrillos, tabaco de Theatre manager o Administrator, Civil Service. Si necesita ayuda para dejar de fumar, consulte al [employer]. Puede recibir  asesoramiento u otro tipo de apoyo para dejar de fumar.  Tome los medicamentos solamente como se lo haya indicado el mdico. Algunos medicamentos son seguros para tomar durante el Psychiatrist y otros no lo son.  Haga ejercicios solamente como se lo haya indicado el mdico. Interrumpa la actividad fsica si comienza a tener calambres.  Ingiera alimentos saludables de [place] regular.  Use un sostn que le brinde buen soporte si sus mamas estn sensibles.  No se d baos de inmersin en agua caliente, baos turcos ni saunas.  Colquese el cinturn de seguridad cuando conduzca.  No coma carne cruda ni queso sin cocinar; evite el contacto con las bandejas sanitarias de los gatos y la tierra que estos animales usan.  Tome las vitaminas prenatales.  Tome entre 1500 y 2000mg  de calcio diariamente comenzando en la semana20 del embarazo [place].  Pruebe tomar un medicamento que la ayude a defecar (un laxante suave) si el mdico lo autoriza. Consuma ms fibra, que se encuentra en las frutas y verduras frescas y los cereales integrales. Beba suficiente lquido para mantener el pis (orina) claro o de color amarillo plido.  Dese baos de asiento con agua tibia  para Engineer, materialsaliviar el dolor o las molestias causadas por las hemorroides. Use una crema para las hemorroides si el mdico la autoriza.  Si se le hinchan las venas (venas varicosas), use medias de descanso. Levante (eleve) los pies durante 15minutos, 3 o 4veces por Futures traderda. Limite el consumo de sal en su dieta.  No levante objetos pesados, use zapatos de tacones bajos y sintese derecha.  Descanse con las piernas elevadas si tiene calambres o dolor de cintura.  Visite a su dentista si no lo ha Occupational hygienisthecho durante el embarazo. Use un cepillo de cerdas suaves para cepillarse los dientes. Psese el hilo dental con suavidad.  Puede seguir Calpine Corporationmanteniendo relaciones sexuales, a menos que el mdico le indique lo contrario.  No haga viajes de larga distancia,  excepto si es obligatorio y solamente con la aprobacin del mdico.  Tome clases prenatales.  Practique ir manejando al hospital.  Prepare el bolso que llevar al hospital.  Prepare la habitacin del beb.  Concurra a los controles mdicos. SOLICITE AYUDA SI:  No est segura de si est en trabajo de parto o si ha roto la bolsa de las aguas.  Tiene mareos.  Siente calambres leves o presin en la parte inferior del abdomen.  Sufre un dolor persistente en el abdomen.  Tiene Programme researcher, broadcasting/film/videomalestar estomacal (nuseas), vmitos, o tiene deposiciones acuosas (diarrea).  Advierte un olor ftido que proviene de la vagina.  Siente dolor al ConocoPhillipsorinar. SOLICITE AYUDA DE INMEDIATO SI:   Tiene fiebre.  Tiene una prdida de lquido por la vagina.  Tiene sangrado o pequeas prdidas vaginales.  Siente dolor intenso o clicos en el abdomen.  Sube o baja de peso rpidamente.  Tiene dificultades para recuperar el aliento y siente dolor en el pecho.  Sbitamente se le hinchan mucho el rostro, las [place]manos, los tobillos, los pies o las piernas.  No ha sentido los movimientos del beb durante Georgianne Fickuna hora.  Siente un dolor de cabeza intenso que no se alivia con medicamentos.  Su visin se modifica.   Esta informacin no tiene Theme park managercomo fin reemplazar el consejo del mdico. Asegrese de hacerle al mdico cualquier pregunta que tenga.   Document Released: 10/06/2012 Document Revised: 02/24/2014 Elsevier Interactive Patient Education [employer]2016 Elsevier Inc.

## 2015-11-21 ENCOUNTER — Telehealth (HOSPITAL_COMMUNITY): Payer: Self-pay | Admitting: *Deleted

## 2015-11-21 ENCOUNTER — Encounter (HOSPITAL_COMMUNITY): Payer: Self-pay | Admitting: *Deleted

## 2015-11-21 NOTE — Telephone Encounter (Signed)
Interpreter number [phone]

## 2015-11-21 NOTE — Telephone Encounter (Signed)
Preadmission screen  

## 2015-11-22 ENCOUNTER — Ambulatory Visit (INDEPENDENT_AMBULATORY_CARE_PROVIDER_SITE_OTHER): Payer: Medicaid Other | Admitting: Obstetrics & Gynecology

## 2015-11-22 VITALS — BP 136/71 | HR 72

## 2015-11-22 DIAGNOSIS — O480 Post-term pregnancy: Secondary | ICD-10-CM | POA: Diagnosis not present

## 2015-11-22 NOTE — Progress Notes (Signed)
NST reviewed and reactive.  Shacara Cozine L. Harraway-Smith, M.D., FACOG    

## 2015-11-22 NOTE — Progress Notes (Signed)
Pt informed of plan of care including next appt on 10/10 for prenatal visit and fetal testing. She was also informed of IOL appt on 10/11. Pt states that she desires to wait longer for spontaneous labor. I advised that she discuss with doctor at her next scheduled visit on 10/10.  Sx of labor reviewed.  Pt agreed and voiced understanding.

## 2015-11-27 ENCOUNTER — Ambulatory Visit (INDEPENDENT_AMBULATORY_CARE_PROVIDER_SITE_OTHER): Payer: Medicaid Other | Admitting: Obstetrics & Gynecology

## 2015-11-27 VITALS — BP 117/74 | HR 73 | Wt 208.0 lb

## 2015-11-27 DIAGNOSIS — Z3483 Encounter for supervision of other normal pregnancy, third trimester: Secondary | ICD-10-CM

## 2015-11-27 DIAGNOSIS — Z3689 Encounter for other specified antenatal screening: Secondary | ICD-10-CM | POA: Diagnosis not present

## 2015-11-27 DIAGNOSIS — O480 Post-term pregnancy: Secondary | ICD-10-CM | POA: Diagnosis not present

## 2015-11-27 DIAGNOSIS — R12 Heartburn: Secondary | ICD-10-CM

## 2015-11-27 DIAGNOSIS — O26893 Other specified pregnancy related conditions, third trimester: Secondary | ICD-10-CM

## 2015-11-27 DIAGNOSIS — O34219 Maternal care for unspecified type scar from previous cesarean delivery: Secondary | ICD-10-CM

## 2015-11-27 DIAGNOSIS — Z3480 Encounter for supervision of other normal pregnancy, unspecified trimester: Secondary | ICD-10-CM

## 2015-11-27 NOTE — Progress Notes (Signed)
Pt reports increased indigestion - Tums not helping much.   IOL scheduled tomorrow however pt would like to discuss further with the doctor.

## 2015-11-27 NOTE — Patient Instructions (Signed)
Regrese a la clinica cuando tenga su cita. Si tiene problemas o preguntas, llama a la clinica o vaya a la sala de emergencia al [facility].    

## 2015-11-27 NOTE — Progress Notes (Signed)
   PRENATAL VISIT NOTE  Subjective:  Lacey Sanders is a 30 y.o. G2P1001 at 8869w4d being seen today for ongoing prenatal care.  She is currently monitored for the following issues for this low-risk pregnancy and has IBS (irritable bowel syndrome); Dizziness and giddiness; GERD (gastroesophageal reflux disease); Environmental allergies; Irregular menstrual cycle; Allergic rhinitis; Supervision of normal pregnancy, antepartum; Previous cesarean delivery affecting pregnancy, antepartum; and Group B Streptococcus carrier, +RV culture, currently pregnant on her problem list.  Patient reports no complaints.  Contractions: Irregular. Vag. Bleeding: None.  Movement: Present. Denies leaking of fluid.   The following portions of the patient's history were reviewed and updated as appropriate: allergies, current medications, past family history, past medical history, past social history, past surgical history and problem list. Problem list updated.  Objective:   Vitals:   11/27/15 1602  BP: 117/74  Pulse: 73  Weight: 208 lb (94.3 kg)    Fetal Status: Fetal Heart Rate (bpm): NST Fundal Height: 40 cm Movement: Present     General:  Alert, oriented and cooperative. Patient is in no acute distress.  Skin: Skin is warm and dry. No rash noted.   Cardiovascular: Normal heart rate noted  Respiratory: Normal respiratory effort, no problems with respiration noted  Abdomen: Soft, gravid, appropriate for gestational age. Pain/Pressure: Present     Pelvic:  Cervical exam deferred        Extremities: Normal range of motion.  Edema: Mild pitting, slight indentation  Mental Status: Normal mood and affect. Normal behavior. Normal judgment and thought content.   NST performed today was reviewed and was found to be reactive.    Assessment and Plan:  Pregnancy: G2P1001 at 9069w4d  1. Heartburn during pregnancy in third trimester Recommended OTC remedies  2. Supervision of other normal pregnancy,  antepartum - Amniotic fluid index with NST next week for postdates testing Patient's EDD is 11/30/15 as per LMP and 20 week scan. Was erroneously changed to 11/21/15 after July scan.  11/30/15 EDD was reinstituted.  Already had NST today erroneous for postdates, was reactive.   3. Previous cesarean delivery affecting pregnancy, antepartum Still desires TOLAC.  Will be rescheduled for IOL at 41 weeks.  Term labor symptoms and general obstetric precautions including but not limited to vaginal bleeding, contractions, leaking of fluid and fetal movement were reviewed in detail with the patient. Please refer to After Visit Summary for other counseling recommendations.  Return in about 1 week (around 12/04/2015) for OB Visit, NST, AFI.  Tereso NewcomerUgonna A Jaymison Luber, MD

## 2015-11-28 ENCOUNTER — Inpatient Hospital Stay (HOSPITAL_COMMUNITY): Admission: RE | Admit: 2015-11-28 | Payer: Self-pay | Source: Ambulatory Visit

## 2015-11-29 ENCOUNTER — Inpatient Hospital Stay (HOSPITAL_COMMUNITY)
Admission: AD | Admit: 2015-11-29 | Discharge: 2015-12-02 | DRG: 775 | Disposition: A | Discharge status: Home / Self Care | Payer: Medicaid Other | Source: Ambulatory Visit | Attending: Obstetrics & Gynecology | Admitting: Obstetrics & Gynecology

## 2015-11-29 ENCOUNTER — Encounter (HOSPITAL_COMMUNITY): Payer: Self-pay

## 2015-11-29 ENCOUNTER — Inpatient Hospital Stay (HOSPITAL_COMMUNITY)
Admission: AD | Admit: 2015-11-29 | Discharge: 2015-11-29 | Disposition: A | Discharge status: Home / Self Care | Payer: Medicaid Other | Source: Ambulatory Visit | Attending: Obstetrics and Gynecology | Admitting: Obstetrics and Gynecology

## 2015-11-29 DIAGNOSIS — Z3483 Encounter for supervision of other normal pregnancy, third trimester: Secondary | ICD-10-CM | POA: Diagnosis present

## 2015-11-29 DIAGNOSIS — O471 False labor at or after 37 completed weeks of gestation: Secondary | ICD-10-CM | POA: Insufficient documentation

## 2015-11-29 DIAGNOSIS — Z3A39 39 weeks gestation of pregnancy: Secondary | ICD-10-CM | POA: Diagnosis not present

## 2015-11-29 DIAGNOSIS — O4202 Full-term premature rupture of membranes, onset of labor within 24 hours of rupture: Secondary | ICD-10-CM | POA: Diagnosis present

## 2015-11-29 DIAGNOSIS — Z3A40 40 weeks gestation of pregnancy: Secondary | ICD-10-CM

## 2015-11-29 DIAGNOSIS — O34219 Maternal care for unspecified type scar from previous cesarean delivery: Secondary | ICD-10-CM

## 2015-11-29 DIAGNOSIS — O99824 Streptococcus B carrier state complicating childbirth: Secondary | ICD-10-CM | POA: Diagnosis present

## 2015-11-29 DIAGNOSIS — O34211 Maternal care for low transverse scar from previous cesarean delivery: Secondary | ICD-10-CM | POA: Diagnosis present

## 2015-11-29 DIAGNOSIS — IMO0002 Reserved for concepts with insufficient information to code with codable children: Secondary | ICD-10-CM | POA: Diagnosis present

## 2015-11-29 LAB — CBC
HCT: 38.6 % (ref 36.0–46.0)
HEMOGLOBIN: 13.5 g/dL (ref 12.0–15.0)
MCH: 31.5 pg (ref 26.0–34.0)
MCHC: 35 g/dL (ref 30.0–36.0)
MCV: 90 fL (ref 78.0–100.0)
Platelets: 222 10*3/uL (ref 150–400)
RBC: 4.29 MIL/uL (ref 3.87–5.11)
RDW: 14.3 % (ref 11.5–15.5)
WBC: 10.1 10*3/uL (ref 4.0–10.5)

## 2015-11-29 MED ORDER — DEXTROSE 5 % IV SOLN
2.5000 10*6.[IU] | INTRAVENOUS | Status: DC
  Administered 2015-11-30 (×5): 2.5 10*6.[IU] via INTRAVENOUS
  Filled 2015-11-29 (×9): qty 2.5

## 2015-11-29 MED ORDER — OXYTOCIN 40 UNITS IN LACTATED RINGERS INFUSION - SIMPLE MED: 2.5000 [IU]/h | INTRAVENOUS | Status: DC

## 2015-11-29 MED ORDER — ACETAMINOPHEN 325 MG PO TABS
650.0000 mg | ORAL_TABLET | ORAL | Status: DC | PRN
  Administered 2015-11-30: 650 mg via ORAL
  Filled 2015-11-29: qty 2

## 2015-11-29 MED ORDER — OXYTOCIN BOLUS FROM INFUSION
500.0000 mL | Freq: Once | INTRAVENOUS | Status: AC
  Administered 2015-11-30: 500 mL via INTRAVENOUS

## 2015-11-29 MED ORDER — OXYCODONE-ACETAMINOPHEN 5-325 MG PO TABS: 1.0000 | ORAL_TABLET | ORAL | Status: DC | PRN

## 2015-11-29 MED ORDER — LACTATED RINGERS IV SOLN
INTRAVENOUS | Status: DC
  Administered 2015-11-29 – 2015-11-30 (×2): via INTRAVENOUS

## 2015-11-29 MED ORDER — OXYCODONE-ACETAMINOPHEN 5-325 MG PO TABS: 2.0000 | ORAL_TABLET | ORAL | Status: DC | PRN

## 2015-11-29 MED ORDER — PENICILLIN G POTASSIUM 5000000 UNITS IJ SOLR
5.0000 10*6.[IU] | Freq: Once | INTRAVENOUS | Status: AC
  Administered 2015-11-30: 5 10*6.[IU] via INTRAVENOUS
  Filled 2015-11-29: qty 5

## 2015-11-29 MED ORDER — LACTATED RINGERS IV SOLN: 500.0000 mL | INTRAVENOUS | Status: DC | PRN

## 2015-11-29 MED ORDER — FENTANYL CITRATE (PF) 100 MCG/2ML IJ SOLN: 50.0000 ug | INTRAMUSCULAR | Status: DC | PRN

## 2015-11-29 MED ORDER — ONDANSETRON HCL 4 MG/2ML IJ SOLN: 4.0000 mg | Freq: Four times a day (QID) | INTRAMUSCULAR | Status: DC | PRN

## 2015-11-29 MED ORDER — SOD CITRATE-CITRIC ACID 500-334 MG/5ML PO SOLN
30.0000 mL | ORAL | Status: DC | PRN
  Administered 2015-11-30: 30 mL via ORAL
  Filled 2015-11-29 (×2): qty 15

## 2015-11-29 MED ORDER — LIDOCAINE HCL (PF) 1 % IJ SOLN
30.0000 mL | INTRAMUSCULAR | Status: DC | PRN
  Filled 2015-11-29: qty 30

## 2015-11-29 MED ORDER — FLEET ENEMA 7-19 GM/118ML RE ENEM: 1.0000 | ENEMA | RECTAL | Status: DC | PRN

## 2015-11-29 NOTE — Progress Notes (Signed)
Orders to D/C home with labor precautions.

## 2015-11-29 NOTE — H&P (Signed)
Lacey Sanders is a 30 y.o. female G2P1001 with IUP at 733w6d presenting for conttractions. Pt states she has been having regular, every 4-5  minutes contractions, associated with none vaginal bleeding for 8 hours..  Membranes are intact, with active fetal movement.   [facility] Cx was 1 cm last night in MAU  Prenatal History/Complications:  Hx CS for placenta previa; desires TOLAC  Past Medical History: History reviewed. No pertinent past medical history.  Past Surgical History: Past Surgical History:  Procedure Laterality Date  . CESAREAN SECTION      Obstetrical History: OB History    Gravida Para Term Preterm AB Living   2 1 1     1    SAB TAB Ectopic Multiple Live Births           1       Social History: Social History   Social History  . Marital status: Single    Spouse name: N/A  . Number of children: N/A  . Years of education: N/A   Social History Main Topics  . Smoking status: Never Smoker  . Smokeless tobacco: Never Used  . Alcohol use No  . Drug use: No  . Sexual activity: Yes   Other Topics Concern  . None   Social History Narrative  . None    Family History: Family History  Problem Relation Age of Onset  . Hyperlipidemia Mother   . Asthma Father     Allergies: No Known Allergies  Prescriptions Prior to Admission  Medication Sig Dispense Refill Last Dose  . calcium carbonate (TUMS - DOSED IN MG ELEMENTAL CALCIUM) 500 MG chewable tablet Chew 1-2 tablets by mouth as needed for indigestion or heartburn.   11/28/2015 at Unknown time  . Prenatal Vit-Fe Fumarate-FA (PRENATAL MULTIVITAMIN) TABS tablet Take 1 tablet by mouth daily at 12 noon.   11/29/2015 at Unknown time     Prenatal Transfer Tool  Maternal Diabetes: No Genetic Screening: Declined Maternal Ultrasounds/Referrals: Normal Fetal Ultrasounds or other Referrals:  None Maternal Substance Abuse:  No Significant Maternal Medications:  None Significant  Maternal Lab Results: Lab values include: Group B Strep positive     Review of Systems   Constitutional: Negative for fever and chills Eyes: Negative for visual disturbances Respiratory: Negative for shortness of breath, dyspnea Cardiovascular: Negative for chest pain or palpitations  Gastrointestinal: Negative for vomiting, diarrhea and constipation.  POSITIVE for abdominal pain (contractions) Genitourinary: Negative for dysuria and urgency Musculoskeletal: Negative for back pain, joint pain, myalgias  Neurological: Negative for dizziness and headaches      Blood pressure 135/78, pulse 77, temperature 98.1 F (36.7 C), temperature source Oral, resp. rate 18, last menstrual period 02/23/2015, SpO2 97 %. General appearance: alert, cooperative and no distress Lungs: clear to auscultation bilaterally Heart: regular rate and rhythm Abdomen: soft, non-tender; bowel sounds normal Extremities: Homans sign is negative, no sign of DVT DTR's 2+ Presentation: cephalic Fetal monitoring  Baseline: 140 bpm, Variability: Good {> 6 bpm), Accelerations: Reactive and Decelerations: Absent Uterine activity   q 4-5 minutes Dilation: 2.5 Effacement (%): 80 Station: -3 Exam by:: Katheren ShamsAmber Stovall, RN   Prenatal labs: ABO, Rh: O/POS/-- (04/06 0941) Antibody: NEG (04/06 0941) Rubella: !Error! RPR: NON REAC (07/18 0855)  HBsAg: NEGATIVE (04/06 0941)  HIV: NONREACTIVE (07/18 0855)  GBS: Positive (09/11 0000)   Clinic [facility] Prenatal Labs  Dating LMP consistent with 19 week scan Blood type: O/POS/-- (04/06 0941)   Genetic Screen  Declines Quad, consistent with 2nd trimester [place] Antibody:NEG (04/06 0941)  Anatomic [place]  Nml limited views > repeat at 25 wks [X]  f/u to complete, scheduled Anatomy complete Rubella: 9.22 (04/06 0941)  GTT Early:  114   Third trimester: 140->3 hour GTT Nml RPR: NON REAC (04/06 0941)   Flu vaccine 10/29/15 HBsAg: NEGATIVE (04/06 0941)   TDaP vaccine  09/04/15               Rhogam: NA HIV: NONREACTIVE (04/06 0941)   Baby Food  breast                                           GBS: POSITIVE  Contraception  undecided, list of options given Pap:  Negative, May 2016  Circumcision  n/a female   Pediatrician  [facility]    Support Person  Agustim (FOB)     No results found for this or any previous visit (from the past 24 hour(s)).  Assessment: Lacey Sanders is a 30 y.o. G2P1001 with an IUP at [redacted]w[redacted]d presenting for early labor, TOLAC  Plan: #Labor: expectant management #Pain:  Per request #FWB Cat 1 #ID: GBS: PCN     CRESENZO-DISHMAN,Ileigh Mettler 11/29/2015, 10:12 PM

## 2015-11-29 NOTE — MAU Note (Signed)
Pt c/o contractions since 2pm that are strong and regular. Pt denies bleeding and leaking of fluid. Pt states baby is moving normally.

## 2015-11-29 NOTE — MAU Note (Signed)
Pt reports contractions and some bleeding today.

## 2015-11-30 ENCOUNTER — Inpatient Hospital Stay (HOSPITAL_COMMUNITY): Payer: Medicaid Other | Admitting: Anesthesiology

## 2015-11-30 LAB — TYPE AND SCREEN
ABO/RH(D): O POS
ANTIBODY SCREEN: NEGATIVE

## 2015-11-30 LAB — RPR: RPR: NONREACTIVE

## 2015-11-30 MED ORDER — TERBUTALINE SULFATE 1 MG/ML IJ SOLN
0.2500 mg | Freq: Once | INTRAMUSCULAR | Status: DC | PRN
  Filled 2015-11-30: qty 1

## 2015-11-30 MED ORDER — FENTANYL 2.5 MCG/ML BUPIVACAINE 1/10 % EPIDURAL INFUSION (WH - ANES)
14.0000 mL/h | INTRAMUSCULAR | Status: DC | PRN
  Administered 2015-11-30 (×4): 14 mL/h via EPIDURAL
  Filled 2015-11-30 (×3): qty 125

## 2015-11-30 MED ORDER — LIDOCAINE-EPINEPHRINE (PF) 2 %-1:200000 IJ SOLN
INTRAMUSCULAR | Status: DC | PRN
  Administered 2015-11-30: 5 mL via INTRADERMAL
  Administered 2015-11-30: 3 mL via INTRADERMAL
  Administered 2015-11-30: 2 mL via INTRADERMAL

## 2015-11-30 MED ORDER — PHENYLEPHRINE 40 MCG/ML (10ML) SYRINGE FOR IV PUSH (FOR BLOOD PRESSURE SUPPORT)
80.0000 ug | PREFILLED_SYRINGE | INTRAVENOUS | Status: DC | PRN
  Filled 2015-11-30: qty 5

## 2015-11-30 MED ORDER — EPHEDRINE 5 MG/ML INJ
10.0000 mg | INTRAVENOUS | Status: DC | PRN
  Filled 2015-11-30: qty 4

## 2015-11-30 MED ORDER — OXYTOCIN 40 UNITS IN LACTATED RINGERS INFUSION - SIMPLE MED
1.0000 m[IU]/min | INTRAVENOUS | Status: DC
  Administered 2015-11-30: 2 m[IU]/min via INTRAVENOUS
  Filled 2015-11-30: qty 1000

## 2015-11-30 MED ORDER — FENTANYL 2.5 MCG/ML BUPIVACAINE 1/10 % EPIDURAL INFUSION (WH - ANES)
INTRAMUSCULAR | Status: AC
  Filled 2015-11-30: qty 125

## 2015-11-30 MED ORDER — PHENYLEPHRINE 40 MCG/ML (10ML) SYRINGE FOR IV PUSH (FOR BLOOD PRESSURE SUPPORT)
PREFILLED_SYRINGE | INTRAVENOUS | Status: AC
  Filled 2015-11-30: qty 10

## 2015-11-30 MED ORDER — DIPHENHYDRAMINE HCL 50 MG/ML IJ SOLN
12.5000 mg | INTRAMUSCULAR | Status: DC | PRN
  Administered 2015-11-30: 12.5 mg via INTRAVENOUS
  Filled 2015-11-30: qty 1

## 2015-11-30 MED ORDER — LACTATED RINGERS IV SOLN: 500.0000 mL | Freq: Once | INTRAVENOUS | Status: DC

## 2015-11-30 MED ORDER — LIDOCAINE HCL (PF) 1 % IJ SOLN
INTRAMUSCULAR | Status: DC | PRN
  Administered 2015-11-30 (×2): 4 mL via EPIDURAL
  Administered 2015-11-30: 2 mL via EPIDURAL

## 2015-11-30 NOTE — Progress Notes (Signed)
SROM around 0030, light mec.  Ctx 2-5 mintues, getting stronger per pt.  FHR 145, Cat 1.  Not requesting any meds, and ctx are getting stronger, so will avoid exams right now.

## 2015-11-30 NOTE — Progress Notes (Signed)
Patient ID: Lacey Sanders, female   DOB: 1985-06-24, 30 y.o.   MRN: [id number]  Having some chest pressure and back pain  T100, 70, 136/78, SaO2 100% FHR 150, +accels, no decels Ctx q 2-4 mins w/ Pit @ 8010mu/min Cx 9/vtx +1; IUPC inserted without difficulty  IUP@term  Protracted active phase, possibly due to inadequate ctx TOLAC  Will attempt to get adequate MVUs, and then recheck cx in 2 hrs  Cam HaiSHAW, KIMBERLY CNM 11/30/2015 6:37 PM

## 2015-11-30 NOTE — Anesthesia Preprocedure Evaluation (Signed)
Anesthesia Evaluation  Patient identified by MRN, date of birth, ID band Patient awake    Reviewed: Allergy & Precautions, H&P , NPO status , Patient's Chart, lab work & pertinent test results  Airway Mallampati: II  TM Distance: >3 FB Neck ROM: full    Dental no notable dental hx.    Pulmonary neg pulmonary ROS,    Pulmonary exam normal breath sounds clear to auscultation       Cardiovascular negative cardio ROS Normal cardiovascular exam Rhythm:regular Rate:Normal     Neuro/Psych negative neurological ROS  negative psych ROS   GI/Hepatic negative GI ROS, Neg liver ROS,   Endo/Other  negative endocrine ROS  Renal/GU negative Renal ROS  negative genitourinary   Musculoskeletal   Abdominal   Peds  Hematology negative hematology ROS (+)   Anesthesia Other Findings Pregnancy - previous C section for previa  Platelets and allergies reviewed Denies active cardiac or pulmonary symptoms, METS > 4  Denies blood thinning medications, bleeding disorders, hypertension, asthma, supine hypotension syndrome, previous anesthesia difficulties   Reproductive/Obstetrics (+) Pregnancy                             Anesthesia Physical Anesthesia Plan  ASA: III  Anesthesia Plan: Epidural   Post-op Pain Management:    Induction:   Airway Management Planned:   Additional Equipment:   Intra-op Plan:   Post-operative Plan:   Informed Consent: I have reviewed the patients History and Physical, chart, labs and discussed the procedure including the risks, benefits and alternatives for the proposed anesthesia with the patient or authorized representative who has indicated his/her understanding and acceptance.     Plan Discussed with:   Anesthesia Plan Comments:         Anesthesia Quick Evaluation

## 2015-11-30 NOTE — Progress Notes (Signed)
Lacey Sanders is a 30 y.o. G2P1001 at 6864w0d  admitted for latent labor  Subjective: Feeling more pressure, but still overall comfortable  Objective: BP (!) 149/83   Pulse 69   Temp 98.9 F (37.2 C) (Oral)   Resp 18   LMP 02/23/2015 (LMP Unknown)   SpO2 98%  No intake/output data recorded. Total I/O In: -  Out: 850 [Urine:850]  FHT:  FHR: 140s bpm, variability: moderate,  accelerations:  Present,  decelerations:  Absent UC:   regular, every 2-4 minutes w/ Pit @ 738mu/min SVE:   Dilation: 8.5 Effacement (%): 90 Station: 0 Exam by:: Philipp DeputyKim Lacey Sanders, CNM  Labs: Lab Results  Component Value Date   WBC 10.1 11/29/2015   HGB 13.5 11/29/2015   HCT 38.6 11/29/2015   MCV 90.0 11/29/2015   PLT 222 11/29/2015    Assessment / Plan: IUP@term  TOLAC Active labor/transition  Check cx in 2 hrs or sooner prn Anticipate SVD  Kennadee Walthour CNM 11/30/2015, 2:26 PM

## 2015-11-30 NOTE — Anesthesia Procedure Notes (Signed)
Epidural Patient location during procedure: OB  Staffing Anesthesiologist: Britain Saber Performed: anesthesiologist   Preanesthetic Checklist Completed: patient identified, site marked, surgical consent, pre-op evaluation, timeout performed, IV checked, risks and benefits discussed and monitors and equipment checked  Epidural Patient position: sitting Prep: site prepped and draped and DuraPrep Patient monitoring: continuous pulse ox and blood pressure Approach: midline Injection technique: LOR saline  Needle:  Needle type: Tuohy  Needle gauge: 17 G Needle length: 9 cm and 9 Needle insertion depth: 6.5 cm Catheter type: closed end flexible Catheter size: 19 Gauge Catheter at skin depth: 11.5 cm Test dose: negative  Assessment Events: blood not aspirated, injection not painful, no injection resistance, negative IV test and no paresthesia  Additional Notes Patient identified. Risks/Benefits/Options discussed with patient including but not limited to bleeding, infection, nerve damage, paralysis, failed block, incomplete pain control, headache, blood pressure changes, nausea, vomiting, reactions to medications, itching and postpartum back pain. Confirmed with bedside nurse the patient's most recent platelet count. Confirmed with patient that they are not currently taking any anticoagulation, have any bleeding history or any family history of bleeding disorders. Patient expressed understanding and wished to proceed. All questions were answered. Sterile technique was used throughout the entire procedure. Please see nursing notes for vital signs. Test dose was given through epidural catheter and negative prior to continuing to dose epidural or start infusion. Warning signs of high block given to the patient including shortness of breath, tingling/numbness in hands, complete motor block, or any concerning symptoms with instructions to call for help. Patient was given instructions on fall risk  and not to get out of bed. All questions and concerns addressed with instructions to call with any issues or inadequate analgesia.  Reason for block:procedure for pain

## 2015-11-30 NOTE — Progress Notes (Signed)
RN notified provider via phone to discuss POC. Provider reviewed strip. CNM wants to wait, and assess. If contractions space out more than [phone] notify provider.

## 2015-11-30 NOTE — Progress Notes (Signed)
  Patient seen and evaluated. Epidural in place. Has occasional discomfort but doing well. Pitocin is running. Contractions q 4-5 minutes, not adequate yet. FHR 140's, category 1 tracing.  Dilation: 4 Effacement (%): 90 Cervical Position: Middle Station: -2, -1 Presentation: Vertex Exam by:: Devra Dopp, RN   Will monitor closely, continue TOLAC  Dolores Patty, DO PGY-1, [facility] 11/30/2015 11:15 AM

## 2015-11-30 NOTE — Anesthesia Pain Management Evaluation Note (Signed)
  CRNA Pain Management Visit Note  Patient: Lacey Sanders, 30 y.o., female  "Hello I am a member of the anesthesia team at [facility]. We have an anesthesia team available at all times to provide care throughout the hospital, including epidural management and anesthesia for C-section. I don't know your plan for the delivery whether it a natural birth, water birth, IV sedation, nitrous supplementation, doula or epidural, but we want to meet your pain goals."   1.Was your pain managed to your expectations on prior hospitalizations?   Yes   2.What is your expectation for pain management during this hospitalization?     Epidural  3.How can we help you reach that goal? Epidural when ready.  Record the patient's initial score and the patient's pain goal.   Pain: 0  Pain Goal: 0 [facility] wants you to be able to say your pain was always managed very well.  Lacey Sanders 11/30/2015

## 2015-12-01 ENCOUNTER — Encounter (HOSPITAL_COMMUNITY): Payer: Self-pay

## 2015-12-01 DIAGNOSIS — Z3A39 39 weeks gestation of pregnancy: Secondary | ICD-10-CM

## 2015-12-01 DIAGNOSIS — O99824 Streptococcus B carrier state complicating childbirth: Secondary | ICD-10-CM

## 2015-12-01 MED ORDER — COCONUT OIL OIL: 1.0000 "application " | TOPICAL_OIL | Status: DC | PRN

## 2015-12-01 MED ORDER — ACETAMINOPHEN 325 MG PO TABS
650.0000 mg | ORAL_TABLET | ORAL | Status: DC | PRN
  Administered 2015-12-02: 650 mg via ORAL
  Filled 2015-12-01: qty 2

## 2015-12-01 MED ORDER — SIMETHICONE 80 MG PO CHEW: 80.0000 mg | CHEWABLE_TABLET | ORAL | Status: DC | PRN

## 2015-12-01 MED ORDER — BENZOCAINE-MENTHOL 20-0.5 % EX AERO
1.0000 "application " | INHALATION_SPRAY | CUTANEOUS | Status: DC | PRN
  Administered 2015-12-01: 1 via TOPICAL
  Filled 2015-12-01: qty 56

## 2015-12-01 MED ORDER — PRENATAL MULTIVITAMIN CH
1.0000 | ORAL_TABLET | Freq: Every day | ORAL | Status: DC
  Administered 2015-12-01 – 2015-12-02 (×2): 1 via ORAL
  Filled 2015-12-01 (×2): qty 1

## 2015-12-01 MED ORDER — ONDANSETRON HCL 4 MG/2ML IJ SOLN: 4.0000 mg | INTRAMUSCULAR | Status: DC | PRN

## 2015-12-01 MED ORDER — DIPHENHYDRAMINE HCL 25 MG PO CAPS: 25.0000 mg | ORAL_CAPSULE | Freq: Four times a day (QID) | ORAL | Status: DC | PRN

## 2015-12-01 MED ORDER — ONDANSETRON HCL 4 MG PO TABS: 4.0000 mg | ORAL_TABLET | ORAL | Status: DC | PRN

## 2015-12-01 MED ORDER — SENNOSIDES-DOCUSATE SODIUM 8.6-50 MG PO TABS
2.0000 | ORAL_TABLET | ORAL | Status: DC
  Filled 2015-12-01: qty 2

## 2015-12-01 MED ORDER — DIBUCAINE 1 % RE OINT: 1.0000 "application " | TOPICAL_OINTMENT | RECTAL | Status: DC | PRN

## 2015-12-01 MED ORDER — WITCH HAZEL-GLYCERIN EX PADS: 1.0000 "application " | MEDICATED_PAD | CUTANEOUS | Status: DC | PRN

## 2015-12-01 MED ORDER — ZOLPIDEM TARTRATE 5 MG PO TABS: 5.0000 mg | ORAL_TABLET | Freq: Every evening | ORAL | Status: DC | PRN

## 2015-12-01 MED ORDER — IBUPROFEN 600 MG PO TABS
600.0000 mg | ORAL_TABLET | Freq: Four times a day (QID) | ORAL | Status: DC
  Administered 2015-12-01 – 2015-12-02 (×6): 600 mg via ORAL
  Filled 2015-12-01 (×6): qty 1

## 2015-12-01 MED ORDER — TETANUS-DIPHTH-ACELL PERTUSSIS 5-2.5-18.5 LF-MCG/0.5 IM SUSP: 0.5000 mL | Freq: Once | INTRAMUSCULAR | Status: DC

## 2015-12-01 NOTE — Progress Notes (Signed)
Post Partum Day #1 Subjective: no complaints, up ad lib and tolerating PO; breastfeeding going well; undecided on contraception  Objective: Blood pressure 114/67, pulse (!) 57, temperature 98.5 F (36.9 C), temperature source Oral, resp. rate 16, height 5\' 1"  (1.549 m), weight 94.3 kg (208 lb), last menstrual period 02/23/2015, SpO2 97 %, unknown if currently breastfeeding.  Physical Exam:  General: alert, cooperative and no distress Lochia: appropriate Uterine Fundus: firm DVT Evaluation: No evidence of DVT seen on physical exam.   Recent Labs  11/29/15 2335  HGB 13.5  HCT 38.6    Assessment/Plan: Plan for discharge tomorrow and Lactation consult   LOS: 2 days   Cam HaiSHAW, KIMBERLY CNM 12/01/2015, 9:37 AM

## 2015-12-01 NOTE — Plan of Care (Signed)
Problem: Activity: Goal: Will verbalize the importance of balancing activity with adequate rest periods Outcome: Completed/Met Date Met: 12/01/15 Pt able to ambulate to the bathroom without difficulty or assistance.  Pt encouraged to increase ambulation in the room and when she feels able to ambulate in the hallways.

## 2015-12-01 NOTE — Lactation Note (Signed)
This note was copied from a baby's chart. Lactation Consultation Note  Patient Name: Lacey Sanders Today's Date: 12/01/2015 Reason for consult: Initial assessment   With this experienced breast feeding mom and baby. The baby is now 1215 hours old. She was a code apgar at birth, doing fine now, and over 9 lbs. Mom denies and trouble with latching or breast discomfort.breast feeding teaching done from the Baby and me book, and lactation teaching done from the brochure. Mom knows to call for questions/concerns.   Maternal Data Formula Feeding for Exclusion: No Has patient been taught Hand Expression?: Yes Does the patient have breastfeeding experience prior to this delivery?: Yes  Feeding Feeding Type: Breast Fed Length of feed: 30 min  LATCH Score/Interventions Latch: Repeated attempts needed to sustain latch, nipple held in mouth throughout feeding, stimulation needed to elicit sucking reflex. Intervention(s): Adjust position;Assist with latch;Breast compression  Audible Swallowing:  (easily expressed colostrum) Intervention(s): Hand expression;Skin to skin  Type of Nipple: Everted at rest and after stimulation  Comfort (Breast/Nipple): Soft / non-tender     Hold (Positioning): Assistance needed to correctly position infant at breast and maintain latch. Intervention(s): Breastfeeding basics reviewed;Support Pillows;Position options;Skin to skin  LATCH Score: 7  Lactation Tools Discussed/Used     Consult Status Consult Status: Follow-up Date: 12/02/15 Follow-up type: In-patient    Alfred LevinsLee, Bina Veenstra Anne 12/01/2015, 2:46 PM

## 2015-12-02 DIAGNOSIS — O34219 Maternal care for unspecified type scar from previous cesarean delivery: Secondary | ICD-10-CM

## 2015-12-02 MED ORDER — IBUPROFEN 600 MG PO TABS: 600.0000 mg | ORAL_TABLET | Freq: Four times a day (QID) | ORAL | 0 refills | Status: DC

## 2015-12-02 NOTE — Discharge Instructions (Signed)
Parto vaginal, Cuidados posteriores  °(Vaginal Delivery, Care After) °Siga estas instrucciones durante las próximas semanas. Estas indicaciones para el alta le proporcionan información general acerca de cómo deberá cuidarse después del parto. El médico también podrá darle instrucciones específicas. El tratamiento ha sido planificado según las prácticas médicas actuales, pero en algunos casos pueden ocurrir problemas. Comuníquese con el médico si tiene algún problema o tiene preguntas al volver a su casa.  °INSTRUCCIONES PARA EL CUIDADO EN EL HOGAR  °· Tome sólo medicamentos de venta libre o recetados, según las indicaciones del médico o del farmacéutico. °· No beba alcohol, especialmente si está amamantando o toma analgésicos. °· No mastique tabaco ni fume. °· No consuma drogas. °· Continúe con un adecuado cuidado perineal. El buen cuidado perineal incluye: °¨ Higienizarse de adelante hacia atrás. °¨ Mantener la zona perineal limpia. °· No use tampones ni duchas vaginales hasta que su médico la autorice. °· Dúchese, lávese el cabello y tome baños de inmersión según las indicaciones de su médico. °· Utilice un sostén que le ajuste bien y que brinde buen soporte a sus mamas. °· Consuma alimentos saludables. °· Beba suficiente líquido para mantener la orina clara o de color amarillo pálido. °· Consuma alimentos ricos en fibra como cereales y panes integrales, arroz, frijoles y frutas y verduras frescas todos los días. Estos alimentos pueden ayudarla a prevenir o aliviar el estreñimiento. °· Siga las recomendaciones de su médico relacionadas con la reanudación de actividades como subir escaleras, conducir automóviles, levantar objetos, hacer ejercicios o viajar. °· Hable con su médico acerca de reanudar la actividad sexual. Volver a la actividad sexual depende del riesgo de infección, la velocidad de la curación y la comodidad y su deseo de reanudarla. °· Trate de que alguien la ayude con las actividades del hogar y con  el recién nacido al menos durante un par de días después de salir del hospital. °· Descanse todo lo que pueda. Trate de descansar o tomar una siesta mientras el bebé está durmiendo. °· Aumente sus actividades gradualmente. °· Cumpla con todas las visitas de control programadas para después del parto. Es muy importante asistir a todas las citas programadas de seguimiento. En estas citas, su médico va a controlarla para asegurarse de que esté sanando física y emocionalmente. °SOLICITE ATENCIÓN MÉDICA SI:  °· Elimina coágulos grandes por la vagina. Guarde algunos coágulos para mostrarle al médico. °· Tiene una secreción con feo olor que proviene de la vagina. °· Tiene dificultad para orinar. °· Orina con frecuencia. °· Siente dolor al orinar. °· Nota un cambio en sus movimientos intestinales. °· Aumenta el enrojecimiento, el dolor o la hinchazón en la zona de la incisión vaginal (episiotomía) o el desgarro vaginal. °· Tiene pus que drena por la episiotomía o el desgarro vaginal. °· La episiotomía o el desgarro vaginal se abren. °· Sus mamas le duelen, están duras o enrojecidas. °· Sufre un dolor intenso de cabeza. °· Tiene visión borrosa o ve manchas. °· Se siente triste o deprimida. °· Tiene pensamientos acerca de lastimarse o dañar al recién nacido. °· Tiene preguntas acerca de su cuidado personal, el cuidado del recién nacido o acerca de los medicamentos. °· Se siente mareada o sufre un desmayo. °· Tiene una erupción. °· Tiene náuseas o vómitos. °· Usted amamantó al bebé y no ha tenido su período menstrual dentro de las 12 semanas después de dejar de amamantar. °· No amamanta al bebé y no tuvo su período menstrual en las últimas 12° semanas después del   parto. °· Tiene fiebre. °SOLICITE ATENCIÓN MÉDICA DE INMEDIATO SI:  °· Siente dolor persistente. °· Siente dolor en el pecho. °· Le falta el aire. °· Se desmaya. °· Siente dolor en la pierna. °· Siente dolor en el estómago. °· El sangrado vaginal satura dos o más  apósitos en 1 hora. °  °Esta información no tiene como fin reemplazar el consejo del médico. Asegúrese de hacerle al médico cualquier pregunta que tenga. °  °Document Released: 02/03/2005 Document Revised: 10/25/2014 °Elsevier Interactive Patient Education ©2016 Elsevier Inc. ° °

## 2015-12-02 NOTE — Lactation Note (Signed)
This note was copied from a baby's chart. Lactation Consultation Note  Patient Name: Lacey Sanders [id number] Date: 12/02/2015 Reason for consult: Follow-up assessment  Baby is 3 hours old and has been consistent at the breast.  Per mom feels the baby has been breast feeding well. LC observed mom latching the baby and assisted mom to obtain depth, multiple swallows noted.  Increased with breast compressions, per mom comfortable. Sore nipple and engorgement prevention and tx reviewed. LC showed mom how to use the hand pump for home. Per mom 1st 1st baby used the #24 flange.  Per mom active with [facility] [place]. Mom aware they can be a BF resource and WH. Mother informed of post-discharge support and given phone number to the lactation department, including services for phone call assistance; out-patient appointments; and breastfeeding support group. List of other breastfeeding resources in the community given in the handout. Encouraged mother to call for problems or concerns related to breastfeeding.   Maternal Data Has patient been taught Hand Expression?: Yes  Feeding Feeding Type: Breast Fed  LATCH Score/Interventions Latch: Grasps breast easily, tongue down, lips flanged, rhythmical sucking. Intervention(s): Adjust position;Assist with latch;Breast massage;Breast compression  Audible Swallowing: Spontaneous and intermittent  Type of Nipple: Everted at rest and after stimulation  Comfort (Breast/Nipple): Filling, red/small blisters or bruises, mild/mod discomfort  Problem noted: Filling  Hold (Positioning): Assistance needed to correctly position infant at breast and maintain latch. Intervention(s): Breastfeeding basics reviewed;Support Pillows;Position options;Skin to skin  LATCH Score: 8  Lactation Tools Discussed/Used Tools: Pump Breast pump type: Manual WIC Program: Yes Pump Review: Setup, frequency, and cleaning;Milk Storage Initiated by:: MAI  Date  initiated:: 12/02/15   Consult Status Consult Status: Complete Date: 12/02/15 Follow-up type: In-patient    Matilde Sprang Kawana Hegel 12/02/2015, 3:14 PM

## 2015-12-02 NOTE — Anesthesia Postprocedure Evaluation (Signed)
Anesthesia Post Note  Patient: Lacey Sanders  Procedure(s) Performed: * No procedures listed *  Patient location during evaluation: Mother Baby Anesthesia Type: Epidural Level of consciousness: awake and alert Pain management: satisfactory to patient Vital Signs Assessment: post-procedure vital signs reviewed and stable Respiratory status: respiratory function stable Cardiovascular status: stable Postop Assessment: no headache, no backache, epidural receding, patient able to bend at knees, no signs of nausea or vomiting and adequate PO intake Anesthetic complications: no     Last Vitals:  Vitals:   12/01/15 1816 12/02/15 0510  BP: 124/75 138/76  Pulse: 62 61  Resp: 18 18  Temp: 36.8 C 36.7 C    Last Pain:  Vitals:   12/02/15 0541  TempSrc:   PainSc: 0-No pain   Pain Goal: Patients Stated Pain Goal: 3 (12/01/15 1139)               Kinslei Labine

## 2015-12-02 NOTE — Progress Notes (Signed)
Post discharge chart review completed.  

## 2015-12-02 NOTE — Discharge Summary (Signed)
OB Discharge Summary  Patient Name: Lacey Sanders DOB: [birth year] MRN: [id number]  Date of admission: 11/29/2015 Delivering MD: Cam HaiSHAW, KIMBERLY D   Date of discharge: 12/02/2015  Admitting diagnosis: 40wks ctx Intrauterine pregnancy: 6260w0d     Secondary diagnosis:Active Problems:   Encounter for trial of labor   VBAC, delivered  Additional problems: None    Discharge diagnosis: VBAC                                                                     Post partum procedures:none  Augmentation: Pitocin  Complications: None  Hospital course:  Onset of Labor With Vaginal Delivery     30 y.o. yo [id number] at 360w0d was admitted in Active Labor on 11/29/2015. Patient had an uncomplicated labor course as follows:  Membrane Rupture Time/Date: 12:34 AM ,11/30/2015   Intrapartum Procedures: Episiotomy: None [1]                                         Lacerations:  1st degree [2];Perineal [11]  Patient had a delivery of a Viable infant. 11/30/2015  Information for the patient's newborn:  Lacey Sanders, Girl Lacey Sanders [[id number]][[id number]]  Delivery Method: VBAC, Spontaneous (Filed from Delivery Summary)    Pateint had an uncomplicated postpartum course.  She is ambulating, tolerating a regular diet, passing flatus, and urinating well. Patient is discharged home in stable condition on 12/02/15.    Physical exam Vitals:   12/01/15 0715 12/01/15 1438 12/01/15 1816 12/02/15 0510  BP: 114/67 119/68 124/75 138/76  Pulse: (!) 57 73 62 61  Resp: 16 18 18 18   Temp: 98.5 F (36.9 C) 98.5 F (36.9 C) 98.3 F (36.8 C) 98 F (36.7 C)  TempSrc: Oral Oral Oral Oral  SpO2:   98%   Weight:      Height:       General: alert, cooperative and no distress Lochia: appropriate Uterine Fundus: firm Incision: N/A DVT Evaluation: No evidence of DVT seen on physical exam. Labs: Lab Results  Component Value Date   WBC 10.1 11/29/2015   HGB 13.5 11/29/2015   HCT 38.6 11/29/2015   MCV 90.0 11/29/2015   PLT 222 11/29/2015   CMP Latest Ref Rng & Units 09/25/2015  Glucose 65 - 104 mg/dL 83  BUN 6 - 23 mg/dL -  Creatinine 7.100.50 - 6.261.10 mg/dL -  Sodium 948135 - 546145 mEq/L -  Potassium 3.5 - 5.3 mEq/L -  Chloride 96 - 112 mEq/L -  CO2 19 - 32 mEq/L -  Calcium 8.4 - 10.5 mg/dL -  Total Protein 6.0 - 8.3 g/dL -  Total Bilirubin 0.2 - 1.2 mg/dL -  Alkaline Phos 39 - 270117 U/L -  AST 0 - 37 U/L -  ALT 0 - 35 U/L -    Discharge instruction: per After Visit Summary and "Baby and Me Booklet".  After Visit Meds:    Medication List    TAKE these medications   calcium carbonate 500 MG chewable tablet Commonly known as:  TUMS - dosed in mg elemental calcium Chew 1-2 tablets by mouth as needed for indigestion or heartburn.  ibuprofen 600 MG tablet Commonly known as:  ADVIL,MOTRIN Take 1 tablet (600 mg total) by mouth every 6 (six) hours.   prenatal multivitamin Tabs tablet Take 1 tablet by mouth daily at 12 noon.       Diet: low salt diet  Activity: Advance as tolerated. Pelvic rest for 6 weeks.   Outpatient follow up:6 weeks Follow up Appt:Future Appointments Date Time Provider Department Center  12/04/2015 2:40 PM Hurshel Party, CNM [facility]   Follow up visit: No Follow-up on file.  Postpartum contraception: Progesterone only pills  Newborn Data: Live born female  Birth Weight: 9 lb 2.7 oz (4160 g) APGAR: 2, 8  Baby Feeding: Breast Disposition:home with mother   12/02/2015 Lacey Sinclair, MD   OB FELLOW DISCHARGE ATTESTATION  I have seen and examined this patient and agree with above documentation in the resident's note.   Jen Mow, DO OB Fellow

## 2015-12-04 ENCOUNTER — Other Ambulatory Visit: Payer: Self-pay | Admitting: Advanced Practice Midwife

## 2015-12-07 ENCOUNTER — Inpatient Hospital Stay (HOSPITAL_COMMUNITY): Payer: Self-pay

## 2016-01-01 ENCOUNTER — Ambulatory Visit (INDEPENDENT_AMBULATORY_CARE_PROVIDER_SITE_OTHER): Payer: Medicaid Other | Admitting: Advanced Practice Midwife

## 2016-01-01 ENCOUNTER — Ambulatory Visit: Payer: Self-pay | Admitting: Advanced Practice Midwife

## 2016-01-01 ENCOUNTER — Other Ambulatory Visit (HOSPITAL_COMMUNITY)
Admission: RE | Admit: 2016-01-01 | Discharge: 2016-01-01 | Disposition: A | Discharge status: Home / Self Care | Payer: Medicaid Other | Source: Ambulatory Visit | Attending: Advanced Practice Midwife | Admitting: Advanced Practice Midwife

## 2016-01-01 DIAGNOSIS — Z113 Encounter for screening for infections with a predominantly sexual mode of transmission: Secondary | ICD-10-CM | POA: Diagnosis not present

## 2016-01-01 LAB — WET PREP, GENITAL
CLUE CELLS WET PREP: NONE SEEN
Trich, Wet Prep: NONE SEEN
Yeast Wet Prep HPF POC: NONE SEEN

## 2016-01-01 MED ORDER — NORETHINDRONE 0.35 MG PO TABS: 1.0000 | ORAL_TABLET | Freq: Every day | ORAL | 11 refills | Status: DC

## 2016-01-01 NOTE — Progress Notes (Signed)
Subjective:     Lacey Sanders is a 30 y.o. female who presents for a postpartum visit. She is 4 weeks postpartum following a spontaneous vaginal delivery. I have fully reviewed the prenatal and intrapartum course. The delivery was at 40 gestational weeks. Outcome: spontaneous vaginal delivery. Anesthesia: none. Postpartum course has been normal. Baby's course has been normal. Baby is feeding by breast. Bleeding no bleeding. Bowel function is normal. Bladder function is normal. Patient is not sexually active. Contraception method is none. Postpartum depression screening: negative.  The following portions of the patient's history were reviewed and updated as appropriate: allergies, current medications, past family history, past medical history, past social history, past surgical history and problem list.  Review of Systems Pertinent items noted in HPI and remainder of comprehensive ROS otherwise negative.   Objective:    There were no vitals taken for this visit.  General:  alert, cooperative and no distress   Breasts:  inspection negative, no nipple discharge or bleeding, no masses or nodularity palpable  Lungs: clear to auscultation bilaterally  Heart:  regular rate and rhythm, S1, S2 normal, no murmur, click, rub or gallop  Abdomen: soft, non-tender; bowel sounds normal; no masses,  no organomegaly   Vulva:  mild erythema noted at introitus  Vagina: mild erythema of vaginal walls  Cervix:  multiparous appearance  Corpus: normal  Adnexa:  normal adnexa  Rectal Exam: Not performed.        Assessment:     Normal postpartum exam. Vaginal irritation--likely physiologic, stitches still dissolving but cultures obtained    Plan:    1. Contraception: oral progesterone-only contraceptive 2. Wet prep/GC pending.  May use OTC yeast medication as desired if symptoms persist. Call office if worsens or not resolved with OTC medication. 3. Follow up PRN

## 2016-01-02 LAB — GC/CHLAMYDIA PROBE AMP (~~LOC~~) NOT AT ARMC
Chlamydia: NEGATIVE
Neisseria Gonorrhea: NEGATIVE

## 2016-07-02 ENCOUNTER — Encounter: Payer: Self-pay | Admitting: Family Medicine

## 2017-02-17 IMAGING — CR DG CHEST 1V
1 series · 1 of 1 positions shown · non-contrast
Comparison: None.

CLINICAL DATA: Positive PPD. No chest complaints. Initial
encounter.

EXAM:
CHEST  1 VIEW

[w chest pa]
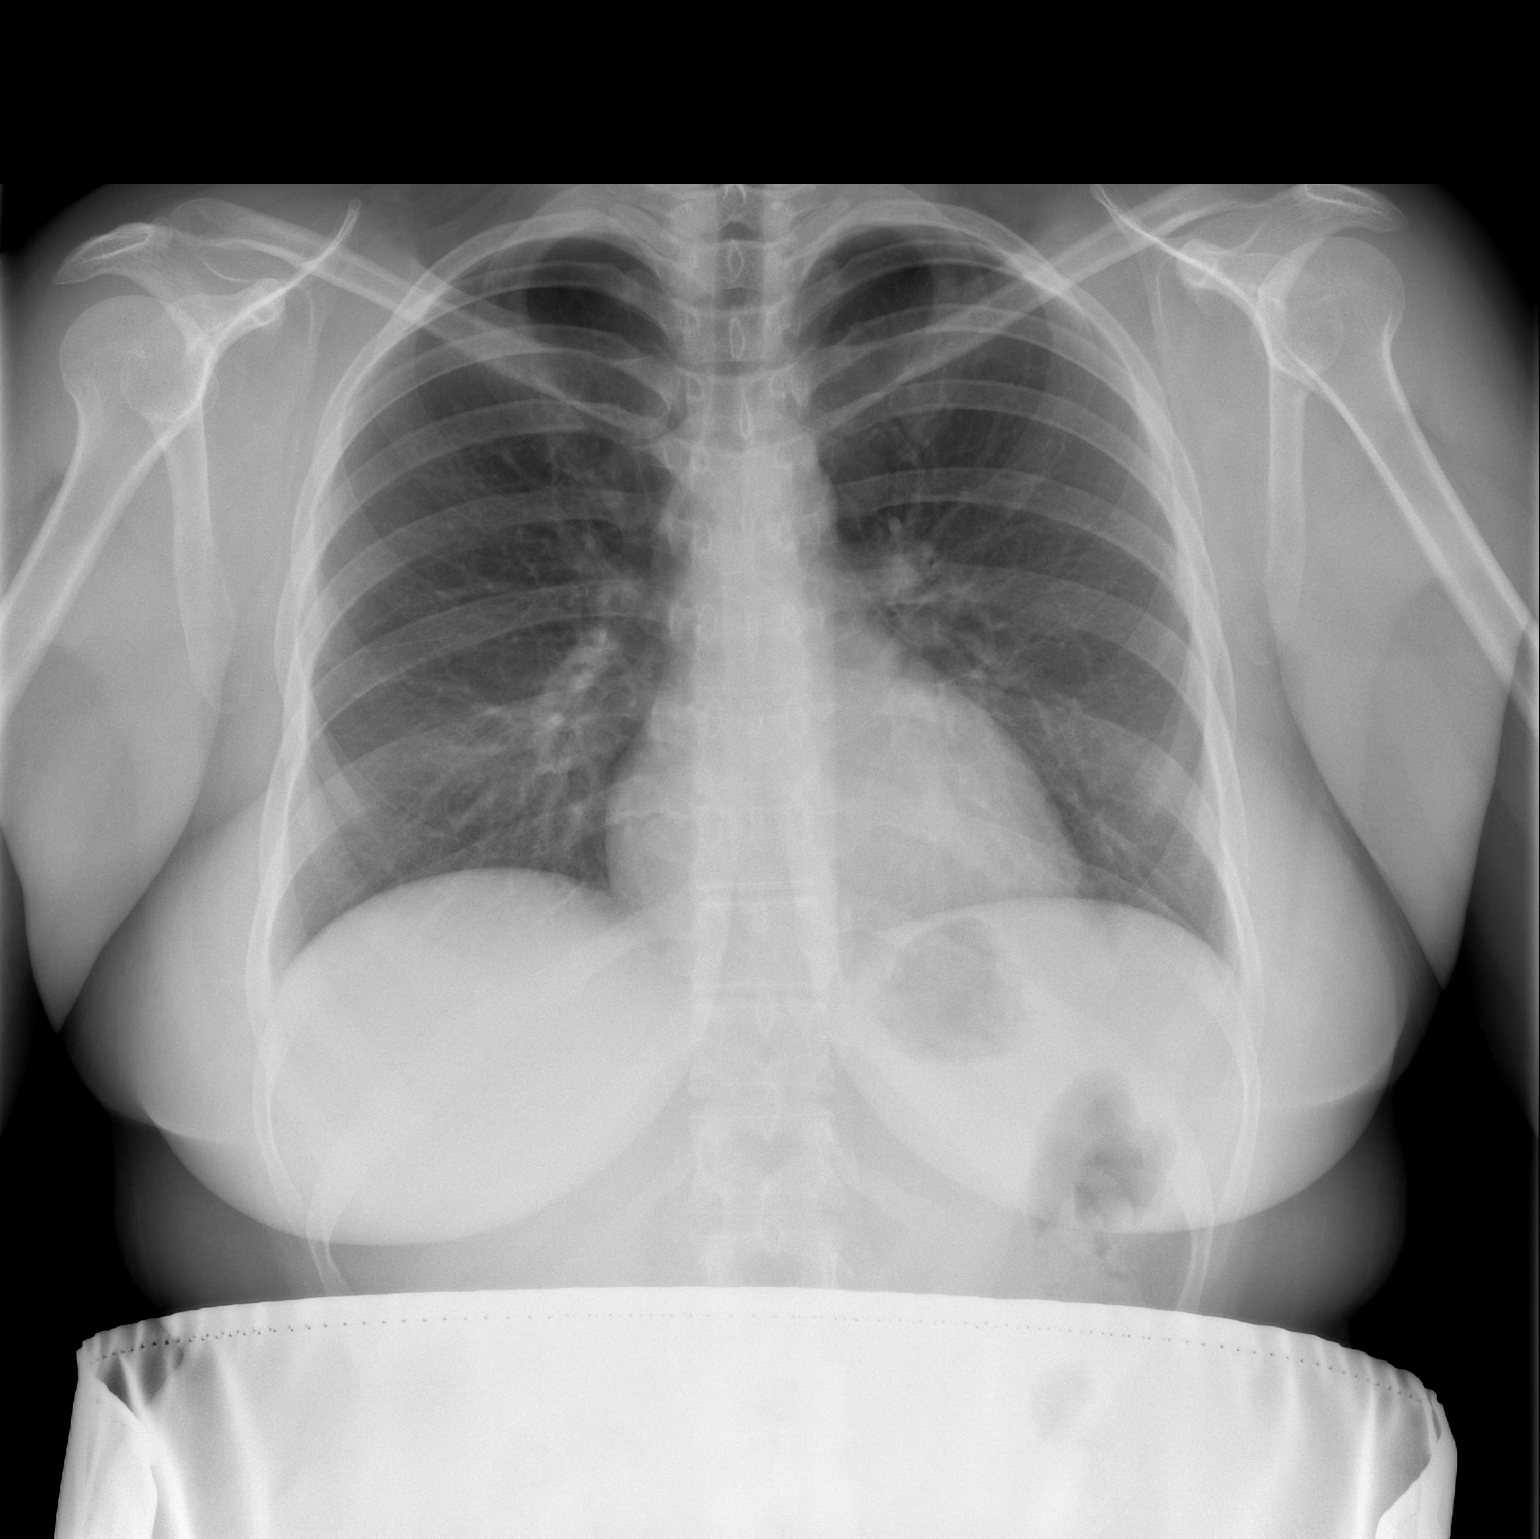

[1 of 1 positions shown; findings below may reference images not displayed]

FINDINGS: The heart size and mediastinal contours are normal. The lungs are
clear. There is no pleural effusion or pneumothorax. No acute
osseous findings are identified.
IMPRESSION: No active cardiopulmonary process. No radiographic evidence of
tuberculosis.

## 2017-02-17 NOTE — L&D Delivery Note (Addendum)
Patient is [age] y.o. [id number] 7773w4d admitted for SOL, hx of c-section due to placenta previa.   Delivery Note At 10:57 AM a viable female was delivered via Vaginal Birth After Cesarean (VBAC), Spontaneous (Presentation: cephalic; ROA ).  APGAR: 8, 9; weight  .   Placenta status: Spontaneous, intact.  Cord: 3VC   Anesthesia:  epidural Episiotomy: None Lacerations: 2nd degree;Perineal Suture Repair: 3.0 vicryl rapide Est. Blood Loss (mL): 262  Mom to postpartum.  Baby to Couplet care / Skin to Skin.  Upon arrival patient was complete and pushing. She pushed with good maternal effort to deliver a healthy baby boy. Baby delivered without difficulty, was noted to have good tone and place on maternal abdomen for oral suctioning, drying and stimulation. Delayed cord clamping performed. Placenta delivered intact with 3V cord. Vaginal canal and perineum was inspected and found to have a second degree perineal laceration which was repaired with a 3-0 vicryl rapide and was hemostatic after repair. Pitocin was started and uterus massaged until bleeding slowed. Counts of sharps, instruments, and lap pads were all correct.   Mirian MoPeter Frank, MD PGY-1 9/23/201911:36 AM  I confirm that I have verified the information documented in the resident's note and that I have also personally reperformed the physical exam and all medical decision making activities.  I was gloved and present for entire delivery SVD without incident No difficulty with shoulders, which were tight but delivered uneventfully Lacerations as listed above Repair of same supervised by me Aviva SignsMarie L Lorrena Goranson, CNM  Please schedule this patient for Postpartum visit in: 4 weeks with the following provider: Any provider For C/S patients schedule nurse incision check in weeks 2 weeks: no High risk pregnancy complicated by: Previous C/S Delivery mode:  VBAC Anticipated Birth Control:  other/unsure PP Procedures needed: none  Schedule Integrated BH  visit: no

## 2017-04-14 ENCOUNTER — Encounter: Payer: Self-pay | Admitting: Family Medicine

## 2017-04-29 ENCOUNTER — Encounter: Payer: Self-pay | Admitting: Obstetrics and Gynecology

## 2017-05-01 ENCOUNTER — Other Ambulatory Visit (HOSPITAL_COMMUNITY)
Admission: RE | Admit: 2017-05-01 | Discharge: 2017-05-01 | Disposition: A | Discharge status: Home / Self Care | Payer: Medicaid Other | Source: Ambulatory Visit | Attending: Family Medicine | Admitting: Family Medicine

## 2017-05-01 ENCOUNTER — Encounter: Payer: Self-pay | Admitting: Family Medicine

## 2017-05-01 ENCOUNTER — Encounter: Payer: Self-pay | Admitting: *Deleted

## 2017-05-01 ENCOUNTER — Ambulatory Visit (INDEPENDENT_AMBULATORY_CARE_PROVIDER_SITE_OTHER): Payer: Medicaid Other | Admitting: Family Medicine

## 2017-05-01 DIAGNOSIS — Z3A13 13 weeks gestation of pregnancy: Secondary | ICD-10-CM | POA: Diagnosis not present

## 2017-05-01 DIAGNOSIS — Z8759 Personal history of other complications of pregnancy, childbirth and the puerperium: Secondary | ICD-10-CM | POA: Insufficient documentation

## 2017-05-01 DIAGNOSIS — O26891 Other specified pregnancy related conditions, first trimester: Secondary | ICD-10-CM | POA: Diagnosis not present

## 2017-05-01 DIAGNOSIS — M25562 Pain in left knee: Secondary | ICD-10-CM

## 2017-05-01 DIAGNOSIS — Z98891 History of uterine scar from previous surgery: Secondary | ICD-10-CM | POA: Insufficient documentation

## 2017-05-01 DIAGNOSIS — Z3492 Encounter for supervision of normal pregnancy, unspecified, second trimester: Secondary | ICD-10-CM | POA: Diagnosis present

## 2017-05-01 DIAGNOSIS — Z3490 Encounter for supervision of normal pregnancy, unspecified, unspecified trimester: Secondary | ICD-10-CM | POA: Insufficient documentation

## 2017-05-01 DIAGNOSIS — O34219 Maternal care for unspecified type scar from previous cesarean delivery: Secondary | ICD-10-CM | POA: Insufficient documentation

## 2017-05-01 LAB — POCT URINALYSIS DIP (DEVICE)
Bilirubin Urine: NEGATIVE
Glucose, UA: NEGATIVE mg/dL
Ketones, ur: NEGATIVE mg/dL
Leukocytes, UA: NEGATIVE
Nitrite: NEGATIVE
PH: 5.5 (ref 5.0–8.0)
PROTEIN: NEGATIVE mg/dL
UROBILINOGEN UA: 0.2 mg/dL (ref 0.0–1.0)

## 2017-05-01 NOTE — Progress Notes (Signed)
  Subjective:  Lacey Sanders is a [id number] 6416w1d being seen today for her first obstetrical visit.  Her obstetrical history is significant for prior cesarean, successful VBAC. Planned pregnancy. Releationship with FOB: spouse. Patient does intend to breast feed. Pregnancy history fully reviewed.  Patient reports knee pain after fall a couple months ago..  BP (!) 110/48   Pulse 80   Wt 160 lb 4.8 oz (72.7 kg)   LMP 01/29/2017 (Exact Date)   BMI 30.29 kg/m   HISTORY: OB History  Gravida Para Term Preterm AB Living  3 2 2  0 0 2  SAB TAB Ectopic Multiple Live Births  0 0 0 0 2    # Outcome Date GA Lbr Len/2nd Weight Sex Delivery Anes PTL Lv  3 Current           2 Term 11/30/15 10083w0d 27:35 / 00:43 9 lb 2.7 oz (4.16 kg) F VBAC EPI  LIV  1 Term 02/19/10 4283w0d  7 lb 15 oz (3.6 kg) F CS-Unspec EPI Y LIV     Complications: Placenta Previa      Past Medical History:  Diagnosis Date  . Medical history non-contributory     Past Surgical History:  Procedure Laterality Date  . CESAREAN SECTION      Family History  Problem Relation Age of Onset  . Hyperlipidemia Mother   . Asthma Father      Exam    Uterus:     [facility]  System[facility]  [facility]   Skin: normal coloration and turgor, no rashes    Neurologic: gait normal; reflexes normal and symmetric   Extremities: normal strength, tone, and muscle mass, no deformities, no erythema, induration, or nodules   HEENT PERRLA and extra ocular movement intact   Mouth/Teeth mucous membranes moist, pharynx normal without lesions   Neck supple and no masses   Cardiovascular: regular rate and rhythm   Respiratory:  appears well, vitals normal, no respiratory distress, acyanotic, normal RR, ear and throat exam is normal, neck free of mass or lymphadenopathy, chest clear, no wheezing, crepitations, rhonchi, normal symmetric air entry   Abdomen: soft, non-tender; bowel sounds normal; no  masses,  no organomegaly      Assessment:    Pregnancy: [id number] Patient Active Problem List   Diagnosis Date Noted  . History of VBAC 05/01/2017  . Previous cesarean section complicating pregnancy 05/01/2017  . History of placenta previa 05/01/2017  . Supervision of low-risk pregnancy 05/01/2017  . GERD (gastroesophageal reflux disease) 09/23/2012  . IBS (irritable bowel syndrome) 09/03/2012      Plan:   1. Encounter for supervision of low-risk pregnancy in second trimester FHT and FH normal - Culture, OB Urine - Cystic fibrosis gene test - Flu Vaccine QUAD 36+ mos IM - Hemoglobinopathy Evaluation - Obstetric Panel, Including HIV - SMN1 COPY NUMBER ANALYSIS (SMA Carrier Screen) - GC/Chlamydia probe amp ([place]  2. History of VBAC Patient would like rpt VBAC  3. Previous cesarean section complicating pregnancy  4. History of placenta previa   5. Acute pain of left knee - AMB referral to sports medicine     Problem list reviewed and updated. 75% of 45 min visit spent on counseling and coordination of care.    Levie HeritageJacob J Bernetta Sutley 05/01/2017

## 2017-05-01 NOTE — Progress Notes (Signed)
Bedside ultrasound performed for FHR. FHR 162bpm

## 2017-05-04 LAB — GC/CHLAMYDIA PROBE AMP (~~LOC~~) NOT AT ARMC
Chlamydia: NEGATIVE
Neisseria Gonorrhea: NEGATIVE

## 2017-05-08 LAB — OBSTETRIC PANEL, INCLUDING HIV
Antibody Screen: NEGATIVE
BASOS ABS: 0 10*3/uL (ref 0.0–0.2)
Basos: 0 %
EOS (ABSOLUTE): 0.3 10*3/uL (ref 0.0–0.4)
Eos: 2 %
HIV SCREEN 4TH GENERATION: NONREACTIVE
Hematocrit: 39.4 % (ref 34.0–46.6)
Hemoglobin: 13.3 g/dL (ref 11.1–15.9)
Hepatitis B Surface Ag: NEGATIVE
Immature Grans (Abs): 0 10*3/uL (ref 0.0–0.1)
Immature Granulocytes: 0 %
LYMPHS ABS: 2.1 10*3/uL (ref 0.7–3.1)
Lymphs: 18 %
MCH: 30.7 pg (ref 26.6–33.0)
MCHC: 33.8 g/dL (ref 31.5–35.7)
MCV: 91 fL (ref 79–97)
Monocytes Absolute: 0.6 10*3/uL (ref 0.1–0.9)
Monocytes: 5 %
NEUTROS ABS: 8.4 10*3/uL — AB (ref 1.4–7.0)
Neutrophils: 75 %
PLATELETS: 295 10*3/uL (ref 150–379)
RBC: 4.33 x10E6/uL (ref 3.77–5.28)
RDW: 14.3 % (ref 12.3–15.4)
RPR Ser Ql: NONREACTIVE
Rh Factor: POSITIVE
Rubella Antibodies, IGG: 8.08 index (ref >0.99)
WBC: 11.4 10*3/uL — AB (ref 3.4–10.8)

## 2017-05-08 LAB — SMN1 COPY NUMBER ANALYSIS (SMA CARRIER SCREENING)

## 2017-05-08 LAB — HEMOGLOBINOPATHY EVALUATION
FERRITIN: 103 ng/mL (ref 15–150)
HGB A: 97.4 % (ref 96.4–98.8)
HGB C: 0 %
HGB F QUANT: 0 % (ref 0.0–2.0)
Hgb A2 Quant: 2.6 % (ref 1.8–3.2)
Hgb S: 0 %
Hgb Solubility: NEGATIVE
Hgb Variant: 0 %

## 2017-05-08 LAB — CULTURE, OB URINE

## 2017-05-08 LAB — CYSTIC FIBROSIS GENE TEST

## 2017-05-08 LAB — URINE CULTURE, OB REFLEX: Organism ID, Bacteria: NO GROWTH

## 2017-05-20 ENCOUNTER — Encounter: Payer: Self-pay | Admitting: *Deleted

## 2017-05-29 ENCOUNTER — Encounter: Payer: Self-pay | Admitting: Medical

## 2017-05-29 ENCOUNTER — Ambulatory Visit (INDEPENDENT_AMBULATORY_CARE_PROVIDER_SITE_OTHER): Payer: Medicaid Other | Admitting: Medical

## 2017-05-29 VITALS — BP 117/71 | HR 74 | Wt 167.5 lb

## 2017-05-29 DIAGNOSIS — O360920 Maternal care for other rhesus isoimmunization, second trimester, not applicable or unspecified: Secondary | ICD-10-CM

## 2017-05-29 DIAGNOSIS — Z23 Encounter for immunization: Secondary | ICD-10-CM | POA: Diagnosis not present

## 2017-05-29 DIAGNOSIS — O34219 Maternal care for unspecified type scar from previous cesarean delivery: Secondary | ICD-10-CM | POA: Diagnosis not present

## 2017-05-29 DIAGNOSIS — Z3492 Encounter for supervision of normal pregnancy, unspecified, second trimester: Secondary | ICD-10-CM

## 2017-05-29 DIAGNOSIS — Z98891 History of uterine scar from previous surgery: Secondary | ICD-10-CM

## 2017-05-29 MED ORDER — RHO D IMMUNE GLOBULIN 1500 UNIT/2ML IJ SOSY
300.0000 ug | PREFILLED_SYRINGE | Freq: Once | INTRAMUSCULAR | Status: AC
  Administered 2017-05-29: 300 ug via INTRAMUSCULAR

## 2017-05-29 NOTE — Progress Notes (Signed)
   PRENATAL VISIT NOTE  Subjective:  Lacey Sanders is a 32 y.o. G3P2002 at 2925w1d being seen today for ongoing prenatal care.  Lacey Sanders is currently monitored for the following issues for this high-risk pregnancy and has IBS (irritable bowel syndrome); GERD (gastroesophageal reflux disease); History of VBAC; Previous cesarean section complicating pregnancy; History of placenta previa; and Supervision of low-risk pregnancy on their problem list.  Patient reports no complaints.  Contractions: Not present. Vag. Bleeding: None.  Movement: Present. Denies leaking of fluid.   The following portions of the patient's history were reviewed and updated as appropriate: allergies, current medications, past family history, past medical history, past social history, past surgical history and problem list. Problem list updated.  Objective:   Vitals:   05/29/17 1103  BP: 117/71  Pulse: 74  Weight: 167 lb 8 oz (76 kg)    Fetal Status: Fetal Heart Rate (bpm): 152   Movement: Present     General:  Alert, oriented and cooperative. Patient is in no acute distress.  Skin: Skin is warm and dry. No rash noted.   Cardiovascular: Normal heart rate noted  Respiratory: Normal respiratory effort, no problems with respiration noted  Abdomen: Soft, gravid, appropriate for gestational age.  Pain/Pressure: Absent     Pelvic: Cervical exam deferred        Extremities: Normal range of motion.  Edema: None  Mental Status: Normal mood and affect. Normal behavior. Normal judgment and thought content.   Assessment and Plan:  Pregnancy: G3P2002 at 3025w1d  1. Encounter for supervision of low-risk pregnancy in second trimester - US MFM OB DETAIL +14 WK; scheduled today   2. History of VBAC - Plans repeat VBAC  3. Previous cesarean section complicating pregnancy  4. Flu vaccine need - Flu Vaccine QUAD 36+ mos IM  Preterm labor/second trimester symptoms and general obstetric precautions including but not  limited to vaginal bleeding, contractions, leaking of fluid and fetal movement were reviewed in detail with the patient. Please refer to After Visit Summary for other counseling recommendations.  Return in about 1 month (around 06/26/2017) for LOB.  No future appointments.  Vonzella NippleJulie Wenzel, PA-C

## 2017-05-29 NOTE — Patient Instructions (Signed)
Second Trimester of Pregnancy The second trimester is from week 13 through week 28, month 4 through 6. This is often the time in pregnancy that you feel your best. Often times, morning sickness has lessened or quit. You may have more energy, and you may get hungry more often. Your unborn baby (fetus) is growing rapidly. At the end of the sixth month, he or she is about 9 inches long and weighs about 1 pounds. You will likely feel the baby move (quickening) between 18 and 20 weeks of pregnancy. Follow these instructions at home:  Avoid all smoking, herbs, and alcohol. Avoid drugs not approved by your doctor.  Do not use any tobacco products, including cigarettes, chewing tobacco, and electronic cigarettes. If you need help quitting, ask your doctor. You may get counseling or other support to help you quit.  Only take medicine as told by your doctor. Some medicines are safe and some are not during pregnancy.  Exercise only as told by your doctor. Stop exercising if you start having cramps.  Eat regular, healthy meals.  Wear a good support bra if your breasts are tender.  Do not use hot tubs, steam rooms, or saunas.  Wear your seat belt when driving.  Avoid raw meat, uncooked cheese, and liter boxes and soil used by cats.  Take your prenatal vitamins.  Take 1500-2000 milligrams of calcium daily starting at the 20th week of pregnancy until you deliver your baby.  Try taking medicine that helps you poop (stool softener) as needed, and if your doctor approves. Eat more fiber by eating fresh fruit, vegetables, and whole grains. Drink enough fluids to keep your pee (urine) clear or pale yellow.  Take warm water baths (sitz baths) to soothe pain or discomfort caused by hemorrhoids. Use hemorrhoid cream if your doctor approves.  If you have puffy, bulging veins (varicose veins), wear support hose. Raise (elevate) your feet for 15 minutes, 3-4 times a day. Limit salt in your diet.  Avoid heavy  lifting, wear low heals, and sit up straight.  Rest with your legs raised if you have leg cramps or low back pain.  Visit your dentist if you have not gone during your pregnancy. Use a soft toothbrush to brush your teeth. Be gentle when you floss.  You can have sex (intercourse) unless your doctor tells you not to.  Go to your doctor visits. Get help if:  You feel dizzy.  You have mild cramps or pressure in your lower belly (abdomen).  You have a nagging pain in your belly area.  You continue to feel sick to your stomach (nauseous), throw up (vomit), or have watery poop (diarrhea).  You have bad smelling fluid coming from your vagina.  You have pain with peeing (urination). Get help right away if:  You have a fever.  You are leaking fluid from your vagina.  You have spotting or bleeding from your vagina.  You have severe belly cramping or pain.  You lose or gain weight rapidly.  You have trouble catching your breath and have chest pain.  You notice sudden or extreme puffiness (swelling) of your face, hands, ankles, feet, or legs.  You have not felt the baby move in over an hour.  You have severe headaches that do not go away with medicine.  You have vision changes. This information is not intended to replace advice given to you by your health care provider. Make sure you discuss any questions you have with your health care   provider. Document Released: 04/30/2009 Document Revised: 07/12/2015 Document Reviewed: 04/06/2012 Elsevier Interactive Patient Education  2017 Elsevier Inc.  

## 2017-05-29 NOTE — Progress Notes (Signed)
OB US scheduled for April 29th @ 1530.  Pt notified.

## 2017-06-15 ENCOUNTER — Ambulatory Visit (HOSPITAL_COMMUNITY)
Admission: RE | Admit: 2017-06-15 | Discharge: 2017-06-15 | Disposition: A | Discharge status: Home / Self Care | Payer: Medicaid Other | Source: Ambulatory Visit | Attending: Medical | Admitting: Medical

## 2017-06-15 DIAGNOSIS — Z3A19 19 weeks gestation of pregnancy: Secondary | ICD-10-CM | POA: Insufficient documentation

## 2017-06-15 DIAGNOSIS — Z363 Encounter for antenatal screening for malformations: Secondary | ICD-10-CM | POA: Insufficient documentation

## 2017-06-15 DIAGNOSIS — O34219 Maternal care for unspecified type scar from previous cesarean delivery: Secondary | ICD-10-CM | POA: Insufficient documentation

## 2017-06-16 ENCOUNTER — Telehealth: Payer: Self-pay | Admitting: Medical

## 2017-06-16 DIAGNOSIS — O283 Abnormal ultrasonic finding on antenatal screening of mother: Secondary | ICD-10-CM | POA: Insufficient documentation

## 2017-06-16 NOTE — Telephone Encounter (Signed)
Opened in error  Marny Lowenstein, PA-C 06/16/2017 8:45 AM

## 2017-06-26 ENCOUNTER — Encounter: Payer: Self-pay | Admitting: Certified Nurse Midwife

## 2017-06-26 ENCOUNTER — Ambulatory Visit (INDEPENDENT_AMBULATORY_CARE_PROVIDER_SITE_OTHER): Payer: Medicaid Other | Admitting: Certified Nurse Midwife

## 2017-06-26 VITALS — BP 107/64 | HR 93 | Wt 171.6 lb

## 2017-06-26 DIAGNOSIS — Z3492 Encounter for supervision of normal pregnancy, unspecified, second trimester: Secondary | ICD-10-CM

## 2017-06-26 DIAGNOSIS — Z98891 History of uterine scar from previous surgery: Secondary | ICD-10-CM

## 2017-06-26 NOTE — Patient Instructions (Signed)
Segundo trimestre de embarazo (Second Trimester of Pregnancy) El segundo trimestre va desde la semana13 hasta la 28, desde el cuarto hasta el sexto mes, y suele ser el momento en el que mejor se siente. En general, las nuseas matutinas han disminuido o han desaparecido completamente. Tendr ms energa y podr aumentarle el apetito. El beb por nacer (feto) se desarrolla rpidamente. Hacia el final del sexto mes, el beb mide aproximadamente 9 pulgadas (23 cm) y pesa alrededor de 1 libras (700 g). Es probable que sienta al beb moverse (dar pataditas) entre las 18 y 20 semanas del embarazo. CUIDADOS EN EL HOGAR  No fume, no consuma hierbas ni beba alcohol. No tome frmacos que el mdico no haya autorizado.  No consuma ningn producto que contenga tabaco, lo que incluye cigarrillos, tabaco de mascar o cigarrillos electrnicos. Si necesita ayuda para dejar de fumar, consulte al mdico. Puede recibir asesoramiento u otro tipo de apoyo para dejar de fumar.  Tome los medicamentos solamente como se lo haya indicado el mdico. Algunos medicamentos son seguros para tomar durante el embarazo y otros no lo son.  Haga ejercicios solamente como se lo haya indicado el mdico. Interrumpa la actividad fsica si comienza a tener calambres.  Ingiera alimentos saludables de manera regular.  Use un sostn que le brinde buen soporte si sus mamas estn sensibles.  No se d baos de inmersin en agua caliente, baos turcos ni saunas.  Colquese el cinturn de seguridad cuando conduzca.  No coma carne cruda ni queso sin cocinar; evite el contacto con las bandejas sanitarias de los gatos y la tierra que estos animales usan.  Tome las vitaminas prenatales.  Tome entre 1500 y 2000mg de calcio diariamente comenzando en la semana20 del embarazo hasta el parto.  Pruebe tomar un medicamento que la ayude a defecar (un laxante suave) si el mdico lo autoriza. Consuma ms fibra, que se encuentra en las frutas y  verduras frescas y los cereales integrales. Beba suficiente lquido para mantener el pis (orina) claro o de color amarillo plido.  Dese baos de asiento con agua tibia para aliviar el dolor o las molestias causadas por las hemorroides. Use una crema para las hemorroides si el mdico la autoriza.  Si se le hinchan las venas (venas varicosas), use medias de descanso. Levante (eleve) los pies durante 15minutos, 3 o 4veces por da. Limite el consumo de sal en su dieta.  No levante objetos pesados, use zapatos de tacones bajos y sintese derecha.  Descanse con las piernas elevadas si tiene calambres o dolor de cintura.  Visite a su dentista si no lo ha hecho durante el embarazo. Use un cepillo de cerdas suaves para cepillarse los dientes. Psese el hilo dental con suavidad.  Puede seguir manteniendo relaciones sexuales, a menos que el mdico le indique lo contrario.  Concurra a los controles mdicos.  SOLICITE AYUDA SI:  Siente mareos.  Sufre calambres o presin leves en la parte baja del vientre (abdomen).  Sufre un dolor persistente en el abdomen.  Tiene malestar estomacal (nuseas), vmitos, o tiene deposiciones acuosas (diarrea).  Advierte un olor ftido que proviene de la vagina.  Siente dolor al orinar.  SOLICITE AYUDA DE INMEDIATO SI:  Tiene fiebre.  Tiene una prdida de lquido por la vagina.  Tiene sangrado o pequeas prdidas vaginales.  Siente dolor intenso o clicos en el abdomen.  Sube o baja de peso rpidamente.  Tiene dificultades para recuperar el aliento y siente dolor en el pecho.  Sbitamente se   le hinchan mucho el rostro, las manos, los tobillos, los pies o las piernas.  No ha sentido los movimientos del beb durante una hora.  Siente un dolor de cabeza intenso que no se alivia con medicamentos.  Su visin se modifica.  Esta informacin no tiene como fin reemplazar el consejo del mdico. Asegrese de hacerle al mdico cualquier pregunta que  tenga. Document Released: 10/06/2012 Document Revised: 02/24/2014 Document Reviewed: 04/06/2012 Elsevier Interactive Patient Education  2017 Elsevier Inc.  

## 2017-06-26 NOTE — Progress Notes (Signed)
   PRENATAL VISIT NOTE  Subjective:  Lacey Sanders is a 32 y.o. G3P2002 at [redacted]w[redacted]d being seen today for ongoing prenatal care.  She is currently monitored for the following issues for this low-risk pregnancy and has IBS (irritable bowel syndrome); GERD (gastroesophageal reflux disease); History of VBAC; Previous cesarean section complicating pregnancy; History of placenta previa; Supervision of low-risk pregnancy; and Abnormal ultrasonic finding on antenatal screening of mother, antepartum on their problem list.  Patient reports no complaints.  Contractions: Not present. Vag. Bleeding: None.  Movement: Present. Denies leaking of fluid.   The following portions of the patient's history were reviewed and updated as appropriate: allergies, current medications, past family history, past medical history, past social history, past surgical history and problem list. Problem list updated.  Objective:   Vitals:   06/26/17 1009  BP: 107/64  Pulse: 93  Weight: 171 lb 9.6 oz (77.8 kg)    Fetal Status: Fetal Heart Rate (bpm): 155   Movement: Present     General:  Alert, oriented and cooperative. Patient is in no acute distress.  Skin: Skin is warm and dry. No rash noted.   Cardiovascular: Normal heart rate noted  Respiratory: Normal respiratory effort, no problems with respiration noted  Abdomen: Soft, gravid, appropriate for gestational age.  Pain/Pressure: Absent     Pelvic: Cervical exam deferred        Extremities: Normal range of motion.  Edema: None  Mental Status: Normal mood and affect. Normal behavior. Normal judgment and thought content.   Assessment and Plan:  Pregnancy: G3P2002 at [redacted]w[redacted]d  1. Encounter for supervision of low-risk pregnancy in second trimester -Patient doing well no complaints  -Reviewed results of [place] with patient, notified that she needs f/u US 4 weeks from last [place] which is scheduled for 5/24 at 1545. Patient aware of appointment and verbalizes  understanding with spanish translator at bedside.  - [place] MFM OB FOLLOW UP; Future  2. History of VBAC -Patient plans repeat VBAC, consent needs to be signed. Hx of C/S x1 with first pregnancy.   Preterm labor symptoms and general obstetric precautions including but not limited to vaginal bleeding, contractions, leaking of fluid and fetal movement were reviewed in detail with the patient. Please refer to After Visit Summary for other counseling recommendations.  Return in about 1 month (around 07/24/2017) for ROB.  Future Appointments  Date Time Provider Department Center  07/10/2017  3:45 PM [facility]-MFC [place] 2 WH-MFCUS MFC-US  07/17/2017  8:55 AM Raelyn Mora, CNM [facility]  08/13/2017  9:55 AM Crisoforo Oxford, Charlesetta Garibaldi, CNM [facility]    Sharyon Cable, CNM

## 2017-06-26 NOTE — Progress Notes (Signed)
Duplicate entered in error

## 2017-06-26 NOTE — Progress Notes (Incomplete)
   PRENATAL VISIT NOTE  Subjective:  Lacey Sanders is a 32 y.o. G3P2002 at [redacted]w[redacted]d being seen today for ongoing prenatal care.  She is currently monitored for the following issues for this {Blank single:19197::"high-risk","low-risk"} pregnancy and has IBS (irritable bowel syndrome); GERD (gastroesophageal reflux disease); History of VBAC; Previous cesarean section complicating pregnancy; History of placenta previa; Supervision of low-risk pregnancy; and Abnormal ultrasonic finding on antenatal screening of mother, antepartum on their problem list.  Patient reports {sx:14538}.  Contractions: Not present. Vag. Bleeding: None.  Movement: Present. Denies leaking of fluid.   The following portions of the patient's history were reviewed and updated as appropriate: allergies, current medications, past family history, past medical history, past social history, past surgical history and problem list. Problem list updated.  Objective:   Vitals:   06/26/17 1009  BP: 107/64  Pulse: 93  Weight: 171 lb 9.6 oz (77.8 kg)    Fetal Status: Fetal Heart Rate (bpm): 155   Movement: Present     General:  Alert, oriented and cooperative. Patient is in no acute distress.  Skin: Skin is warm and dry. No rash noted.   Cardiovascular: Normal heart rate noted  Respiratory: Normal respiratory effort, no problems with respiration noted  Abdomen: Soft, gravid, appropriate for gestational age.  Pain/Pressure: Absent     Pelvic: {Blank single:19197::"Cervical exam performed","Cervical exam deferred"}        Extremities: Normal range of motion.  Edema: None  Mental Status: Normal mood and affect. Normal behavior. Normal judgment and thought content.   Assessment and Plan:  Pregnancy: G3P2002 at [redacted]w[redacted]d  1. Encounter for supervision of low-risk pregnancy in second trimester *** - [place] MFM OB FOLLOW UP; Future  2. History of VBAC ***  {Blank single:19197::"Term","Preterm"} labor symptoms and general  obstetric precautions including but not limited to vaginal bleeding, contractions, leaking of fluid and fetal movement were reviewed in detail with the patient. Please refer to After Visit Summary for other counseling recommendations.  Return in about 1 month (around 07/24/2017) for ROB.  Future Appointments  Date Time Provider Department Center  07/10/2017  3:45 PM [facility]-M[facility] [place] 2 WH-MFCUS MFC-US  07/17/2017  8:55 AM Raelyn Mora, CNM [facility]  08/13/2017  9:55 AM Crisoforo Oxford, Charlesetta Garibaldi, CNM [facility]    Sharyon Cable, CNM

## 2017-07-10 ENCOUNTER — Ambulatory Visit (HOSPITAL_COMMUNITY)
Admission: RE | Admit: 2017-07-10 | Discharge: 2017-07-10 | Disposition: A | Discharge status: Home / Self Care | Payer: Medicaid Other | Source: Ambulatory Visit | Attending: Certified Nurse Midwife | Admitting: Certified Nurse Midwife

## 2017-07-10 DIAGNOSIS — Z3A23 23 weeks gestation of pregnancy: Secondary | ICD-10-CM | POA: Insufficient documentation

## 2017-07-10 DIAGNOSIS — O34219 Maternal care for unspecified type scar from previous cesarean delivery: Secondary | ICD-10-CM | POA: Diagnosis not present

## 2017-07-10 DIAGNOSIS — Z3492 Encounter for supervision of normal pregnancy, unspecified, second trimester: Secondary | ICD-10-CM

## 2017-07-10 DIAGNOSIS — Z362 Encounter for other antenatal screening follow-up: Secondary | ICD-10-CM | POA: Diagnosis not present

## 2017-07-10 DIAGNOSIS — O283 Abnormal ultrasonic finding on antenatal screening of mother: Secondary | ICD-10-CM | POA: Insufficient documentation

## 2017-07-17 ENCOUNTER — Ambulatory Visit (INDEPENDENT_AMBULATORY_CARE_PROVIDER_SITE_OTHER): Payer: Medicaid Other | Admitting: Obstetrics and Gynecology

## 2017-07-17 VITALS — BP 123/59 | HR 77 | Wt 180.0 lb

## 2017-07-17 DIAGNOSIS — Z3492 Encounter for supervision of normal pregnancy, unspecified, second trimester: Secondary | ICD-10-CM

## 2017-07-17 NOTE — Progress Notes (Signed)
   PRENATAL VISIT NOTE - with [employer] Video Interpreter  Subjective:  Lacey Sanders is a 32 y.o. G3P2002 at 3427w1d being seen today for ongoing prenatal care.  She is currently monitored for the following issues for this low-risk pregnancy and has IBS (irritable bowel syndrome); GERD (gastroesophageal reflux disease); History of VBAC; Previous cesarean section complicating pregnancy; History of placenta previa; Supervision of low-risk pregnancy; Abnormal ultrasonic finding on antenatal screening of mother, antepartum; Abnormal fetal ultrasound; and [redacted] weeks gestation of pregnancy on their problem list.  Patient reports no complaints.  Contractions: Not present. Vag. Bleeding: None.  Movement: Present. Denies leaking of fluid.   The following portions of the patient's history were reviewed and updated as appropriate: allergies, current medications, past family history, past medical history, past social history, past surgical history and problem list. Problem list updated.  Objective:   Vitals:   07/17/17 0910  BP: (!) 123/59  Pulse: 77  Weight: 180 lb (81.6 kg)    Fetal Status: Fetal Heart Rate (bpm): 154 Fundal Height: 25 cm Movement: Present     General:  Alert, oriented and cooperative. Patient is in no acute distress.  Skin: Skin is warm and dry. No rash noted.   Cardiovascular: Normal heart rate noted  Respiratory: Normal respiratory effort, no problems with respiration noted  Abdomen: Soft, gravid, appropriate for gestational age.  Pain/Pressure: Absent     Pelvic: Cervical exam deferred        Extremities: Normal range of motion.  Edema: None  Mental Status: Normal mood and affect. Normal behavior. Normal judgment and thought content.   Assessment and Plan:  Pregnancy: G3P2002 at 5427w1d  1. Encounter for supervision of low-risk pregnancy in second trimester - Discussed results of last U/S, explained need for rpt U/S around 6/20 or 6/21 - US MFM OB FOLLOW UP;  08/07/2017 @ 1030  Preterm labor symptoms and general obstetric precautions including but not limited to vaginal bleeding, contractions, leaking of fluid and fetal movement were reviewed in detail with the patient. Please refer to After Visit Summary for other counseling recommendations.    Future Appointments  Date Time Provider Department Center  08/07/2017 10:30 AM [facility]-M[facility] US 5 WH-MFCUS MFC-US  08/13/2017  9:55 AM Kooistra, Charlesetta GaribaldiKathryn Lorraine, CNM [facility]    Raelyn Moraolitta Aryelle Figg, CNM

## 2017-08-07 ENCOUNTER — Other Ambulatory Visit: Payer: Self-pay | Admitting: Obstetrics and Gynecology

## 2017-08-07 ENCOUNTER — Ambulatory Visit (HOSPITAL_COMMUNITY)
Admission: RE | Admit: 2017-08-07 | Discharge: 2017-08-07 | Disposition: A | Discharge status: Home / Self Care | Payer: Medicaid Other | Source: Ambulatory Visit | Attending: Obstetrics and Gynecology | Admitting: Obstetrics and Gynecology

## 2017-08-07 DIAGNOSIS — O359XX0 Maternal care for (suspected) fetal abnormality and damage, unspecified, not applicable or unspecified: Secondary | ICD-10-CM | POA: Diagnosis not present

## 2017-08-07 DIAGNOSIS — O34219 Maternal care for unspecified type scar from previous cesarean delivery: Secondary | ICD-10-CM | POA: Insufficient documentation

## 2017-08-07 DIAGNOSIS — Z3A27 27 weeks gestation of pregnancy: Secondary | ICD-10-CM | POA: Insufficient documentation

## 2017-08-07 DIAGNOSIS — Z3492 Encounter for supervision of normal pregnancy, unspecified, second trimester: Secondary | ICD-10-CM

## 2017-08-13 ENCOUNTER — Ambulatory Visit (INDEPENDENT_AMBULATORY_CARE_PROVIDER_SITE_OTHER): Payer: Medicaid Other | Admitting: Student

## 2017-08-13 VITALS — BP 112/57 | HR 80 | Wt 185.5 lb

## 2017-08-13 DIAGNOSIS — Z3492 Encounter for supervision of normal pregnancy, unspecified, second trimester: Secondary | ICD-10-CM

## 2017-08-13 DIAGNOSIS — O283 Abnormal ultrasonic finding on antenatal screening of mother: Secondary | ICD-10-CM

## 2017-08-13 DIAGNOSIS — O34219 Maternal care for unspecified type scar from previous cesarean delivery: Secondary | ICD-10-CM

## 2017-08-13 NOTE — Progress Notes (Signed)
   PRENATAL VISIT NOTE  Subjective:  Lacey Sanders is a 32 y.o. G3P2002 at 5852w0d being seen today for ongoing prenatal care.  She is currently monitored for the following issues for this low-risk pregnancy and has IBS (irritable bowel syndrome); GERD (gastroesophageal reflux disease); History of VBAC; Previous cesarean section complicating pregnancy; History of placenta previa; Supervision of low-risk pregnancy; Abnormal ultrasonic finding on antenatal screening of mother, antepartum; Abnormal fetal ultrasound; and [redacted] weeks gestation of pregnancy on their problem list.  Patient reports no complaints.  Contractions: Not present. Vag. Bleeding: None.  Movement: Present. Denies leaking of fluid.   The following portions of the patient's history were reviewed and updated as appropriate: allergies, current medications, past family history, past medical history, past social history, past surgical history and problem list. Problem list updated.  Objective:   Vitals:   08/13/17 1041  BP: (!) 112/57  Pulse: 80  Weight: 185 lb 8 oz (84.1 kg)    Fetal Status: Fetal Heart Rate (bpm): 155 Fundal Height: 28 cm Movement: Present     General:  Alert, oriented and cooperative. Patient is in no acute distress.  Skin: Skin is warm and dry. No rash noted.   Cardiovascular: Normal heart rate noted  Respiratory: Normal respiratory effort, no problems with respiration noted  Abdomen: Soft, gravid, appropriate for gestational age.  Pain/Pressure: Absent     Pelvic: Cervical exam deferred        Extremities: Normal range of motion.  Edema: None  Mental Status: Normal mood and affect. Normal behavior. Normal judgment and thought content.   Assessment and Plan:  Pregnancy: G3P2002 at 2452w0d  1. Abnormal ultrasonic finding on antenatal screening of mother, antepartum -Reviewed US findings with patient. Showing CPC resolving and no mention of EIF.   2. Encounter for supervision of low-risk  pregnancy in second trimester -Patient will return for 2 hour GTT; she is not fasting today. Reviewed importance keeping that appointment.   3. Previous cesarean section complicating pregnancy -Patient signed TOLAC consent today  Preterm labor symptoms and general obstetric precautions including but not limited to vaginal bleeding, contractions, leaking of fluid and fetal movement were reviewed in detail with the patient. Please refer to After Visit Summary for other counseling recommendations.  Return in about 2 weeks (around 08/27/2017), or LROB and 2 hour gtt asap, for needs 2 hr gtt/ 28 wk labs within next week .  Future Appointments  Date Time Provider Department Center  08/19/2017  8:50 AM [facility] LAB [facility]  09/01/2017 10:55 AM Willodean RosenthalHarraway-Smith, Carolyn, MD [facility] [id number]    Tryone Kille Lorraine AvonKooistra, [place]

## 2017-08-13 NOTE — Patient Instructions (Signed)
Prueba de tolerancia a la glucosa durante el embarazo Glucose Tolerance Test During Pregnancy La prueba de tolerancia a la glucosa es un anlisis de sangre que se [place] para determinar si ha contrado un tipo de diabetes durante el embarazo (diabetes gestacional). Esto es cuando el cuerpo no procesa de forma Arboriculturistcorrecta el azcar (glucosa) en los alimentos que come, lo cual provoca niveles sanguneos altos de glucosa. Por lo general, la PTG se realiza despus de haberse hecho una prueba de glucosa de 1 hora, cuyos resultados indican que posiblemente tiene diabetes gestacional. Tambin se puede hacer si:  Tiene antecedentes de haber parido bebs muy grandes o antecedentes de muerte fetal repetida (beb nacido muerto).  Tiene signos o sntomas de diabetes, tales como: ? Cambios en la visin. ? Hormigueo o adormecimiento en las manos o los pies. ? Cambios en el hambre, la sed y la miccin que no se explican por Firefighterel embarazo.  La PTG dura unas 3 horas. Le darn para beber una solucin de agua y azcar al principio de la prueba. Le extraern sangre antes de que beba la solucin, y 1, 2 y 3 horas despus de beberla. No se le permitir comer ni beber nada ms durante la prueba. Debe Geneticist, molecularpermanecer en el lugar en que se realiza la prueba para asegurarse de que la sangre se extraiga puntualmente. Tambin debe evitar realizar ejercicios durante la prueba porque esto puede AutoZonealterar los resultados. Cmo debo prepararme para esta prueba? Coma normalmente durante 3 das antes de la PTG e incluya muchos alimentos ricos en carbohidratos. No coma ni beba nada, excepto agua, durante las ltimas 12 horas antes de la prueba. Adems, el mdico puede pedirle que deje de tomar ciertos medicamentos antes de la prueba. Qu significan los resultados? Es su responsabilidad retirar el resultado del Capitolaestudio. Consulte en el laboratorio o en el departamento en el que fue realizado el estudio cundo y cmo podr Starbucks Corporationobtener los resultados.  Comunquese con el mdico si tiene Smith Internationalpreguntas sobre los resultados. Rango de Circuit Cityvalores normales Los rangos para los valores normales pueden variar entre diferentes laboratorios y hospitales. Siempre debe consultar a su mdico despus de realizarse un anlisis u otros estudios para saber si los valores de sus Wyolaresultados se consideran dentro de los lmites normales. Los niveles normales de glucemia son los siguientes:  Ayuno: menos de 105mg /dl.  Una hora despus de beber la solucin: menos de 190mg /dl.  Dos horas despus de beber la solucin: menos de 165mg /dl.  Tres horas despus de beber la solucin: menos de 145mg /dl.  Algunas sustancias pueden AutoZonealterar los resultados de la PTG. Estos pueden incluir lo siguiente:  Medicamentos para la presin arterial y la insuficiencia cardaca, como betabloqueantes, furosemida y tiacidas.  Antiinflamatorios, como aspirina.  La nicotina.  Algunos medicamentos psiquitricos.  Significado de los Ball Corporationresultados que estn fuera de los rangos de los valores normales Los resultados de la PTG por debajo de los valores normales pueden indicar varios problemas de [place] Acressalud, tales como:  Diabetes gestacional.  Respuesta al estrs agudo.  Sndrome de [place].  Tumores como feocromocitoma o glucagonoma.  Problemas renales de larga duracin.  Pancreatitis.  Hipertiroidismo.  Infeccin actual.  Hable con el mdico Dole Foodsobre los resultados. El mdico Calpine Corporationutilizar los resultados para Education officer, environmentalrealizar un diagnstico y Chief Strategy Officerdeterminar un plan de tratamiento adecuado para usted. Esta informacin no tiene Theme park managercomo fin reemplazar el consejo del mdico. Asegrese de hacerle al mdico cualquier pregunta que tenga. Document Released: 08/05/2011 Document Revised: 04/24/2016 Document Reviewed: 06/10/2013 Elsevier Interactive Patient Education  2018 Elsevier Inc.  

## 2017-08-19 ENCOUNTER — Other Ambulatory Visit: Payer: Self-pay | Admitting: General Practice

## 2017-08-19 ENCOUNTER — Other Ambulatory Visit: Payer: Medicaid Other

## 2017-08-19 DIAGNOSIS — Z3493 Encounter for supervision of normal pregnancy, unspecified, third trimester: Secondary | ICD-10-CM

## 2017-08-20 LAB — RPR: RPR: NONREACTIVE

## 2017-08-20 LAB — CBC
HEMATOCRIT: 39 % (ref 34.0–46.6)
HEMOGLOBIN: 12.7 g/dL (ref 11.1–15.9)
MCH: 30.9 pg (ref 26.6–33.0)
MCHC: 32.6 g/dL (ref 31.5–35.7)
MCV: 95 fL (ref 79–97)
Platelets: 259 10*3/uL (ref 150–450)
RBC: 4.11 x10E6/uL (ref 3.77–5.28)
RDW: 12.7 % (ref 12.3–15.4)
WBC: 10.6 10*3/uL (ref 3.4–10.8)

## 2017-08-20 LAB — GLUCOSE TOLERANCE, 2 HOURS W/ 1HR
Glucose, 1 hour: 142 mg/dL (ref 65–179)
Glucose, 2 hour: 83 mg/dL (ref 65–152)
Glucose, Fasting: 87 mg/dL (ref 65–91)

## 2017-08-20 LAB — HIV ANTIBODY (ROUTINE TESTING W REFLEX): HIV Screen 4th Generation wRfx: NONREACTIVE

## 2017-08-24 ENCOUNTER — Encounter: Payer: Self-pay | Admitting: *Deleted

## 2017-09-01 ENCOUNTER — Ambulatory Visit (INDEPENDENT_AMBULATORY_CARE_PROVIDER_SITE_OTHER): Payer: Medicaid Other | Admitting: Obstetrics & Gynecology

## 2017-09-01 VITALS — BP 122/60 | HR 97 | Wt 191.0 lb

## 2017-09-01 DIAGNOSIS — O283 Abnormal ultrasonic finding on antenatal screening of mother: Secondary | ICD-10-CM

## 2017-09-01 DIAGNOSIS — Z98891 History of uterine scar from previous surgery: Secondary | ICD-10-CM

## 2017-09-01 DIAGNOSIS — K219 Gastro-esophageal reflux disease without esophagitis: Secondary | ICD-10-CM

## 2017-09-01 DIAGNOSIS — Z23 Encounter for immunization: Secondary | ICD-10-CM | POA: Diagnosis present

## 2017-09-01 DIAGNOSIS — K580 Irritable bowel syndrome with diarrhea: Secondary | ICD-10-CM

## 2017-09-01 DIAGNOSIS — Z3493 Encounter for supervision of normal pregnancy, unspecified, third trimester: Secondary | ICD-10-CM

## 2017-09-01 DIAGNOSIS — O34219 Maternal care for unspecified type scar from previous cesarean delivery: Secondary | ICD-10-CM

## 2017-09-01 DIAGNOSIS — Z8759 Personal history of other complications of pregnancy, childbirth and the puerperium: Secondary | ICD-10-CM

## 2017-09-01 NOTE — Patient Instructions (Addendum)
BENEFITS OF BREASTFEEDING Many women wonder if they should breastfeed. Research shows that breast milk contains the perfect balance of vitamins, protein and fat that your baby needs to grow. It also contains antibodies that help your baby's immune system to fight off viruses and bacteria and can reduce the risk of sudden infant death syndrome (SIDS). In addition, the colostrum (a fluid secreted from the breast in the first few days after delivery) helps your newborn's digestive system to grow and function well. Breast milk is easier to digest than formula. Also, if your baby is born preterm, breast milk can help to reduce both short- and long-term health problems. BENEFITS OF BREASTFEEDING FOR MOM . Breastfeeding causes a hormone to be released that helps the uterus to contract and return to its normal size more quickly. . It aids in postpartum weight loss, reduces risk of breast and ovarian cancer, heart disease and rheumatoid arthritis. . It decreases the amount of bleeding after the baby is born. benefits of breastfeeding for baby . Provides comfort and nutrition . Protects baby against - Obesity - Diabetes - Asthma - Childhood cancers - Heart disease - Ear infections - Diarrhea - Pneumonia - Stomach problems - Serious allergies - Skin rashes . Promotes growth and development . Reduces the risk of baby having Sudden Infant Death Syndrome (SIDS) only breastmilk for the first 6 months . Protects baby against diseases/allergies . It's the perfect amount for tiny bellies . It restores baby's energy . Provides the best nutrition for baby . Giving water or formula can make baby more likely to get sick, decrease Mom's milk supply, make baby less content with breastfeeding Skin to Skin After delivery, the staff will place your baby on your chest. This helps with the following: . Regulates baby's temperature, breathing, heart rate and blood sugar . Increases Mom's milk supply . Promotes  bonding . Keeps baby and Mom calm and decreases baby's crying Rooming In Your baby will stay in your room with you for the entire time you are in the hospital. This helps with the following: . Allows Mom to learn baby's feeding cues - Fluttering eyes - Sucking on tongue or hand - Rooting (opens mouth and turns head) - Nuzzling into the breast - Bringing hand to mouth . Allows breastfeeding on demand (when your baby is ready) . Helps baby to be calm and content . Ensures a good milk supply . Prevents complications with breastfeeding . Allows parents to learn to care for baby . Allows you to request assistance with breastfeeding Importance of a good latch . Increases milk transfer to baby - baby gets enough milk . Ensures you have enough milk for your baby . Decreases nipple soreness . Don't use pacifiers and bottles - these cause baby to suck differently than breastfeeding . Promotes continuation of breastfeeding Risks of Formula Supplementation with Breastfeeding Giving your infant formula in addition to your breast-milk EXCEPT when medically necessary can lead to: . Decreases your milk supply  . Loss of confidence in yourself for providing baby's nutrition  . Engorgement and possibly mastitis  . Asthma & allergies in the baby BREASTFEEDING FAQS How long should I breastfeed my baby? It is recommended that you provide your baby with breast milk only for the first 6 months and then continue for the first year and longer as desired. During the first few weeks after birth, your baby will need to feed 8-12 times every 24 hours, or every 2-3 hours. They will likely feed   for 15-30 minutes. How can I help my baby begin breastfeeding? Babies are born with an instinct to breastfeed. A healthy baby can begin breastfeeding right away without specific help. At the hospital, a nurse (or lactation consultant) will help you begin the process and will give you tips on good positioning. It may be  helpful to take a breastfeeding class before you deliver in order to know what to expect. How can I help my baby latch on? In order to assist your baby in latching-on, cup your breast in your hand and stroke your baby's lower lip with your nipple to stimulate your baby's rooting reflex. Your baby will look like he or she is yawning, at which point you should bring the baby towards your breast, while aiming the nipple at the roof of his or her mouth. Remember to bring the baby towards you and not your breast towards the baby. How can I tell if my baby is latched-on? Your baby will have all of your nipple and part of the dark area around the nipple in his or her mouth and your baby's nose will be touching your breast. You should see or hear the baby swallowing. If the baby is not latched-on properly, start the process over. To remove the suction, insert a clean finger between your breast and the baby's mouth. Should I switch breasts during feeding? After feeding on one side, switch the baby to your other breast. If he or she does not continue feeding - that is OK. Your baby will not necessarily need to feed from both breasts in a single feeding. On the next feeding, start with the other breast for efficiency and comfort. How can I tell if my baby is hungry? When your baby is hungry, they will nuzzle against your breast, make sucking noises and tongue motions and may put their hands near their mouth. Crying is a late sign of hunger, so you should not wait until this point. When they have received enough milk, they will unlatch from the breast. Is it okay to use a pacifier? Until your baby gets the hang of breastfeeding, experts recommend limiting pacifier usage. If you have questions about this, please contact your pediatrician. What can I do to ensure proper nutrition while breastfeeding? . Make sure that you support your own health and your baby's by eating a healthy, well-balanced diet . Your provider  may recommend that you continue to take your prenatal vitamin . Drink plenty of fluids. It is a good rule to drink one glass of water before or after feeding . Alcohol will remain in the breast milk for as long as it will remain in the blood stream. If you choose to have a drink, it is recommended that you wait at least 2 hours before feeding . Moderate amounts of caffeine are OK . Some over-the-counter or prescription medications are not recommended during breastfeeding. Check with your provider if you have questions BENEFITS OF BREASTFEEDING Many women wonder if they should breastfeed. Research shows that breast milk contains the perfect balance of vitamins, protein and fat that your baby needs to grow. It also contains antibodies that help your baby's immune system to fight off viruses and bacteria and can reduce the risk of sudden infant death syndrome (SIDS). In addition, the colostrum (a fluid secreted from the breast in the first few days after delivery) helps your newborn's digestive system to grow and function well. Breast milk is easier to digest than formula. Also, if your  baby is born preterm, breast milk can help to reduce both short- and long-term health problems. BENEFITS OF BREASTFEEDING FOR MOM . Breastfeeding causes a hormone to be released that helps the uterus to contract and return to its normal size more quickly. . It aids in postpartum weight loss, reduces risk of breast and ovarian cancer, heart disease and rheumatoid arthritis. . It decreases the amount of bleeding after the baby is born. benefits of breastfeeding for baby . Provides comfort and nutrition . Protects baby against - Obesity - Diabetes - Asthma - Childhood cancers - Heart disease - Ear infections - Diarrhea - Pneumonia - Stomach problems - Serious allergies - Skin rashes . Promotes growth and development . Reduces the risk of baby having Sudden Infant Death Syndrome (SIDS) only breastmilk for the  first 6 months . Protects baby against diseases/allergies . It's the perfect amount for tiny bellies . It restores baby's energy . Provides the best nutrition for baby . Giving water or formula can make baby more likely to get sick, decrease Mom's milk supply, make baby less content with breastfeeding Skin to Skin After delivery, the staff will place your baby on your chest. This helps with the following: . Regulates baby's temperature, breathing, heart rate and blood sugar . Increases Mom's milk supply . Promotes bonding . Keeps baby and Mom calm and decreases baby's crying Rooming In Your baby will stay in your room with you for the entire time you are in the hospital. This helps with the following: . Allows Mom to learn baby's feeding cues - Fluttering eyes - Sucking on tongue or hand - Rooting (opens mouth and turns head) - Nuzzling into the breast - Bringing hand to mouth . Allows breastfeeding on demand (when your baby is ready) . Helps baby to be calm and content . Ensures a good milk supply . Prevents complications with breastfeeding . Allows parents to learn to care for baby . Allows you to request assistance with breastfeeding Importance of a good latch . Increases milk transfer to baby - baby gets enough milk . Ensures you have enough milk for your baby . Decreases nipple soreness . Don't use pacifiers and bottles - these cause baby to suck differently than breastfeeding . Promotes continuation of breastfeeding Risks of Formula Supplementation with Breastfeeding Giving your infant formula in addition to your breast-milk EXCEPT when medically necessary can lead to: Marland Kitchen Decreases your milk supply  . Loss of confidence in yourself for providing baby's nutrition  . Engorgement and possibly mastitis  . Asthma & allergies in the baby BREASTFEEDING FAQS How long should I breastfeed my baby? It is recommended that you provide your baby with breast milk only for the first 6  months and then continue for the first year and longer as desired. During the first few weeks after birth, your baby will need to feed 8-12 times every 24 hours, or every 2-3 hours. They will likely feed for 15-30 minutes. How can I help my baby begin breastfeeding? Babies are born with an instinct to breastfeed. A healthy baby can begin breastfeeding right away without specific help. At the hospital, a nurse (or lactation consultant) will help you begin the process and will give you tips on good positioning. It may be helpful to take a breastfeeding class before you deliver in order to know what to expect. How can I help my baby latch on? In order to assist your baby in latching-on, cup your breast in your hand and  stroke your baby's lower lip with your nipple to stimulate your baby's rooting reflex. Your baby will look like he or she is yawning, at which point you should bring the baby towards your breast, while aiming the nipple at the roof of his or her mouth. Remember to bring the baby towards you and not your breast towards the baby. How can I tell if my baby is latched-on? Your baby will have all of your nipple and part of the dark area around the nipple in his or her mouth and your baby's nose will be touching your breast. You should see or hear the baby swallowing. If the baby is not latched-on properly, start the process over. To remove the suction, insert a clean finger between your breast and the baby's mouth. Should I switch breasts during feeding? After feeding on one side, switch the baby to your other breast. If he or she does not continue feeding - that is OK. Your baby will not necessarily need to feed from both breasts in a single feeding. On the next feeding, start with the other breast for efficiency and comfort. How can I tell if my baby is hungry? When your baby is hungry, they will nuzzle against your breast, make sucking noises and tongue motions and may put their hands near their  mouth. Crying is a late sign of hunger, so you should not wait until this point. When they have received enough milk, they will unlatch from the breast. Is it okay to use a pacifier? Until your baby gets the hang of breastfeeding, experts recommend limiting pacifier usage. If you have questions about this, please contact your pediatrician. What can I do to ensure proper nutrition while breastfeeding? . Make sure that you support your own health and your baby's by eating a healthy, well-balanced diet . Your provider may recommend that you continue to take your prenatal vitamin . Drink plenty of fluids. It is a good rule to drink one glass of water before or after feeding . Alcohol will remain in the breast milk for as long as it will remain in the blood stream. If you choose to have a drink, it is recommended that you wait at least 2 hours before feeding . Moderate amounts of caffeine are OK . Some over-the-counter or prescription medications are not recommended during breastfeeding. Check with your provider if you have questions What types of birth control methods are safe while breastfeeding? Progestin-only methods, including a daily pill, an IUD, the implant and the injection are safe while breastfeeding. Methods that contain estrogen (such as combination birth control pills, the vaginal ring and the patch) should not be used during the first month of breastfeeding as these can decrease your milk supply.  Tercer trimestre de Psychiatristembarazo (Third Trimester of Pregnancy) El tercer trimestre comprende desde la semana29 hasta la semana42, es decir, desde el mes7 hasta el [address]. En este trimestre, el feto crece muy rpido. Hacia el final del noveno mes, el feto mide alrededor de 20pulgadas (45cm) de largo y pesa entre 6y 10libras [id number](2,700y 4,500kg). CUIDADOS EN EL HOGAR  No fume, no consuma hierbas ni beba alcohol. No tome frmacos que el mdico no haya autorizado.  No consuma ningn producto que  contenga tabaco, lo que incluye cigarrillos, tabaco de Theatre managermascar o Administrator, Civil Servicecigarrillos electrnicos. Si necesita ayuda para dejar de fumar, consulte al American Expressmdico. Puede recibir asesoramiento u otro tipo de apoyo para dejar de fumar.  Tome los medicamentos solamente como se lo haya indicado  el mdico. Algunos medicamentos son seguros para tomar durante el Psychiatrist y otros no lo son.  Haga ejercicios solamente como se lo haya indicado el mdico. Interrumpa la actividad fsica si comienza a tener calambres.  Ingiera alimentos saludables de [place] regular.  Use un sostn que le brinde buen soporte si sus mamas estn sensibles.  No se d baos de inmersin en agua caliente, baos turcos ni saunas.  Colquese el cinturn de seguridad cuando conduzca.  No coma carne cruda ni queso sin cocinar; evite el contacto con las bandejas sanitarias de los gatos y la tierra que estos animales usan.  Tome las vitaminas prenatales.  Tome entre 1500 y 2000mg  de calcio diariamente comenzando en la semana20 del embarazo [place].  Pruebe tomar un medicamento que la ayude a defecar (un laxante suave) si el mdico lo autoriza. Consuma ms fibra, que se encuentra en las frutas y verduras frescas y los cereales integrales. Beba suficiente lquido para mantener el pis (orina) claro o de color amarillo plido.  Dese baos de asiento con agua tibia para Engineer, materials o las molestias causadas por las hemorroides. Use una crema para las hemorroides si el mdico la autoriza.  Si se le hinchan las venas (venas varicosas), use medias de descanso. [place] (eleve) los pies durante , 3 o 4veces por Futures trader. Limite el consumo de sal en su dieta.  No levante objetos pesados, use zapatos de tacones bajos y sintese derecha.  Descanse con las piernas elevadas si tiene calambres o dolor de cintura.  Visite a su dentista si no lo ha Occupational hygienist. Use un cepillo de cerdas suaves para cepillarse los dientes. Psese  el hilo dental con suavidad.  Puede seguir Calpine Corporation, a menos que el mdico le indique lo contrario.  No haga viajes de larga distancia, excepto si es obligatorio y solamente con la aprobacin del mdico.  Tome clases prenatales.  Practique ir manejando al hospital.  Prepare el bolso que llevar al hospital.  Prepare la habitacin del beb.  Concurra a los controles mdicos.  SOLICITE AYUDA SI:  No est segura de si est en trabajo de parto o si ha roto la bolsa de las aguas.  Tiene mareos.  Siente calambres leves o presin en la parte inferior del abdomen.  Sufre un dolor persistente en el abdomen.  Tiene Programme researcher, broadcasting/film/video (nuseas), vmitos, o tiene deposiciones acuosas (diarrea).  Advierte un olor ftido que proviene de la vagina.  Siente dolor al ConocoPhillips.  SOLICITE AYUDA DE INMEDIATO SI:  Tiene fiebre.  Tiene una prdida de lquido por la vagina.  Tiene sangrado o pequeas prdidas vaginales.  Siente dolor intenso o clicos en el abdomen.  Sube o baja de peso rpidamente.  Tiene dificultades para recuperar el aliento y siente dolor en el pecho.  Sbitamente se le hinchan mucho el rostro, las [place], los tobillos, los pies o las piernas.  No ha sentido los movimientos del beb durante Georgianne Fick.  Siente un dolor de cabeza intenso que no se alivia con medicamentos.  Su visin se modifica.  Esta informacin no tiene Theme park manager el consejo del mdico. Asegrese de hacerle al mdico cualquier pregunta que tenga. Document Released: 10/06/2012 Document Revised: 02/24/2014 Document Reviewed: 04/06/2012 Elsevier Interactive Patient Education  2017 [employer].

## 2017-09-01 NOTE — Progress Notes (Signed)
   PRENATAL VISIT NOTE  Subjective:  Lacey Sanders is a 32 y.o. G3P2002 at 11110w5d being seen today for ongoing prenatal care.  She is currently monitored for the following issues for this high-risk pregnancy and has IBS (irritable bowel syndrome); GERD (gastroesophageal reflux disease); History of VBAC; Previous cesarean section complicating pregnancy; History of placenta previa; Supervision of low-risk pregnancy; Abnormal ultrasonic finding on antenatal screening of mother, antepartum; and Abnormal fetal ultrasound on their problem list.  Patient reports pelvic pressure. .  Contractions: Irritability. Vag. Bleeding: None.  Movement: Present. Denies leaking of fluid.   The following portions of the patient's history were reviewed and updated as appropriate: allergies, current medications, past family history, past medical history, past social history, past surgical history and problem list. Problem list updated.  Objective:   Vitals:   09/01/17 1114  BP: 122/60  Pulse: 97  Weight: 191 lb (86.6 kg)    Fetal Status: Fetal Heart Rate (bpm): 152   Movement: Present     General:  Alert, oriented and cooperative. Patient is in no acute distress.  Skin: Skin is warm and dry. No rash noted.   Cardiovascular: Normal heart rate noted  Respiratory: Normal respiratory effort, no problems with respiration noted  Abdomen: Soft, gravid, appropriate for gestational age.  Pain/Pressure: Present     Pelvic: Cervical exam deferred        Extremities: Normal range of motion.  Edema: Trace  Mental Status: Normal mood and affect. Normal behavior. Normal judgment and thought content.   Assessment and Plan:  Pregnancy: G3P2002 at 109110w5d  1. Encounter for supervision of low-risk pregnancy in third trimester  2. Previous cesarean section complicating pregnancy Pt has signed TOLAC form.   3. Irritable bowel syndrome with diarrhea  4. History of VBAC  5. History of placenta  previa resolved  6. Gastroesophageal reflux disease without esophagitis   Preterm labor symptoms and general obstetric precautions including but not limited to vaginal bleeding, contractions, leaking of fluid and fetal movement were reviewed in detail with the patient. Please refer to After Visit Summary for other counseling recommendations.  Return in about 2 weeks (around 09/15/2017).  No future appointments.  Willodean Rosenthalarolyn Harraway-Smith, MD

## 2017-09-01 NOTE — Progress Notes (Signed)
Per Patient She's having pain in Pelvic area while walking and standing.

## 2017-09-15 ENCOUNTER — Ambulatory Visit (INDEPENDENT_AMBULATORY_CARE_PROVIDER_SITE_OTHER): Payer: Medicaid Other | Admitting: Student

## 2017-09-15 ENCOUNTER — Encounter: Payer: Medicaid Other | Admitting: Medical

## 2017-09-15 DIAGNOSIS — Z3493 Encounter for supervision of normal pregnancy, unspecified, third trimester: Secondary | ICD-10-CM

## 2017-09-15 NOTE — Patient Instructions (Signed)
 Lactancia materna Breastfeeding Decidir amamantar es una de las mejores elecciones que puede hacer por usted y su beb. Un cambio en las hormonas durante el embarazo hace que las mamas produzcan leche materna en las glndulas productoras de leche. Las hormonas impiden que la leche materna sea liberada antes del nacimiento del beb. Adems, impulsan el flujo de leche luego del nacimiento. Una vez que ha comenzado a amamantar, pensar en el beb, as como la succin o el llanto, pueden estimular la liberacin de leche de las glndulas productoras de leche. Los beneficios de amamantar Las investigaciones demuestran que la lactancia materna ofrece muchos beneficios de salud para bebs y madres. Adems, ofrece una forma gratuita y conveniente de alimentar al beb. Para el beb  La primera leche (calostro) ayuda a mejorar el funcionamiento del aparato digestivo del beb.  Las clulas especiales de la leche (anticuerpos) ayudan a combatir las infecciones en el beb.  Los bebs que se alimentan con leche materna tambin tienen menos probabilidades de tener asma, alergias, obesidad o diabetes de tipo 2. Adems, tienen menor riesgo de sufrir el sndrome de muerte sbita del lactante (SMSL).  Los nutrientes de la leche materna son mejores para satisfacer las necesidades del beb en comparacin con la leche maternizada.  La leche materna mejora el desarrollo cerebral del beb. Para usted  La lactancia materna favorece el desarrollo de un vnculo muy especial entre la madre y el beb.  Es conveniente. La leche materna es econmica y siempre est disponible a la temperatura correcta.  La lactancia materna ayuda a quemar caloras. Le ayuda a perder el peso ganado durante el embarazo.  Hace que el tero vuelva al tamao que tena antes del embarazo ms rpido. Adems, disminuye el sangrado (loquios) despus del parto.  La lactancia materna contribuye a reducir el riesgo de tener diabetes de tipo 2,  osteoporosis, artritis reumatoide, enfermedades cardiovasculares y cncer de mama, ovario, tero y endometrio en el futuro. Informacin bsica sobre la lactancia Comienzo de la lactancia  Encuentre un lugar cmodo para sentarse o acostarse, con un buen respaldo para el cuello y la espalda.  Coloque una almohada o una manta enrollada debajo del beb para acomodarlo a la altura de la mama (si est sentada). Las almohadas para amamantar se han diseado especialmente a fin de servir de apoyo para los brazos y el beb mientras amamanta.  Asegrese de que la barriga del beb (abdomen) est frente a la suya.  Masajee suavemente la mama. Con las yemas de los dedos, masajee los bordes exteriores de la mama hacia adentro, en direccin al pezn. Esto estimula el flujo de leche. Si la leche fluye lentamente, es posible que deba continuar con este movimiento durante la lactancia.  Sostenga la mama con 4 dedos por debajo y el pulgar por arriba del pezn (forme la letra "C" con la mano). Asegrese de que los dedos se encuentren lejos del pezn y de la boca del beb.  Empuje suavemente los labios del beb con el pezn o con el dedo.  Cuando la boca del beb se abra lo suficiente, acrquelo rpidamente a la mama e introduzca todo el pezn y la arola, tanto como sea posible, dentro de la boca del beb. La arola es la zona de color que rodea al pezn. ? Debe haber ms arola visible por arriba del labio superior del beb que por debajo del labio inferior. ? Los labios del beb deben estar abiertos y extendidos hacia afuera (evertidos) para asegurar que   el beb se prenda de forma adecuada y cmoda. ? La lengua del beb debe estar entre la enca inferior y la mama.  Asegrese de que la boca del beb est en la posicin correcta alrededor del pezn (prendido). Los labios del beb deben crear un sello sobre la mama y estar doblados hacia afuera (invertidos).  Es comn que el beb succione durante 2 a 3 minutos  para que comience el flujo de leche materna. Cmo debe prenderse Es muy importante que le ensee al beb cmo prenderse adecuadamente a la mama. Si el beb no se prende adecuadamente, puede causar dolor en los pezones, reducir la produccin de leche materna y hacer que el beb tenga un escaso aumento de peso. Adems, si el beb no se prende adecuadamente al pezn, puede tragar aire durante la alimentacin. Esto puede causarle molestias al beb. Hacer eructar al beb al cambiar de mama puede ayudarlo a liberar el aire. Sin embargo, ensearle al beb cmo prenderse a la mama adecuadamente es la mejor manera de evitar que se sienta molesto por tragar aire mientras se alimenta. Signos de que el beb se ha prendido adecuadamente al pezn  Tironea o succiona de modo silencioso, sin causarle dolor. Los labios del beb deben estar extendidos hacia afuera (evertidos).  Se escucha que traga cada 3 o 4 succiones una vez que la leche ha comenzado a fluir (despus de que se produzca el reflejo de eyeccin de la leche).  Hay movimientos musculares por arriba y por delante de sus odos al succionar.  Signos de que el beb no se ha prendido adecuadamente al pezn  Hace ruidos de succin o de chasquido mientras se alimenta.  Siente dolor en los pezones.  Si cree que el beb no se prendi correctamente, deslice el dedo en la comisura de la boca y colquelo entre las encas del beb para interrumpir la succin. Intente volver a comenzar a amamantar. Signos de lactancia materna exitosa Signos del beb  El beb disminuir gradualmente el nmero de succiones o dejar de succionar por completo.  El beb se quedar dormido.  El cuerpo del beb se relajar.  El beb retendr una pequea cantidad de leche en la boca.  El beb se desprender solo del pecho.  Signos que presenta usted  Las mamas han aumentado la firmeza, el peso y el tamao 1 a 3 horas despus de amamantar.  Estn ms blandas inmediatamente  despus de amamantar.  Se producen un aumento del volumen de leche y un cambio en su consistencia y color hacia el quinto da de lactancia.  Los pezones no duelen, no estn agrietados ni sangran.  Signos de que su beb recibe la cantidad de leche suficiente  Mojar por lo menos 1 o 2paales durante las primeras 24horas despus del nacimiento.  Mojar por lo menos 5 o 6paales cada 24horas durante la primera semana despus del nacimiento. La orina debe ser clara o de color amarillo plido a los 5das de vida.  Mojar entre 6 y 8paales cada 24horas a medida que el beb sigue creciendo y desarrollndose.  Defeca por lo menos 3 veces en 24 horas a los 5 das de vida. Las heces deben ser blandas y amarillentas.  Defeca por lo menos 3 veces en 24 horas a los 7 das de vida. Las heces deben ser grumosas y amarillentas.  No registra una prdida de peso mayor al 10% del peso al nacer durante los primeros 3 das de vida.  Aumenta de peso un   promedio de 4 a 7onzas (113 a 198g) por semana despus de los 4 das de vida.  Aumenta de peso, diariamente, de manera uniforme a partir de los 5 das de vida, sin registrar prdida de peso despus de las 2semanas de vida. Despus de alimentarse, es posible que el beb regurgite una pequea cantidad de leche. Esto es normal. Frecuencia y duracin de la lactancia El amamantamiento frecuente la ayudar a producir ms leche y puede prevenir dolores en los pezones y las mamas extremadamente llenas (congestin mamaria). Alimente al beb cuando muestre signos de hambre o si siente la necesidad de reducir la congestin de las mamas. Esto se denomina "lactancia a demanda". Las seales de que el beb tiene hambre incluyen las siguientes:  Aumento del estado de alerta, actividad o inquietud.  Mueve la cabeza de un lado a otro.  Abre la boca cuando se le toca la mejilla o la comisura de la boca (reflejo de bsqueda).  Aumenta las vocalizaciones, tales como  sonidos de succin, se relame los labios, emite arrullos, suspiros o chirridos.  Mueve la mano hacia la boca y se chupa los dedos o las manos.  Est molesto o llora.  Evite el uso del chupete en las primeras 4 a 6 semanas despus del nacimiento del beb. Despus de este perodo, podr usar un chupete. Las investigaciones demostraron que el uso del chupete durante el primer ao de vida del beb disminuye el riesgo de tener el sndrome de muerte sbita del lactante (SMSL). Permita que el nio se alimente en cada mama todo lo que desee. Cuando el beb se desprende o se queda dormido mientras se est alimentando de la primera mama, ofrzcale la segunda. Debido a que, con frecuencia, los recin nacidos estn somnolientos las primeras semanas de vida, es posible que deba despertar al beb para alimentarlo. Los horarios de lactancia varan de un beb a otro. Sin embargo, las siguientes reglas pueden servir como gua para ayudarla a garantizar que el beb se alimenta adecuadamente:  Se puede amamantar a los recin nacidos (bebs de 4 semanas o menos de vida) cada 1 a 3 horas.  No deben transcurrir ms de 3 horas durante el da o 5 horas durante la noche sin que se amamante a los recin nacidos.  Debe amamantar al beb un mnimo de 8 veces en un perodo de 24 horas.  Extraccin de leche materna La extraccin y el almacenamiento de la leche materna le permiten asegurarse de que el beb se alimente exclusivamente de su leche materna, aun en momentos en los que no puede amamantar. Esto tiene especial importancia si debe regresar al trabajo en el perodo en que an est amamantando o si no puede estar presente en los momentos en que el beb debe alimentarse. Su asesor en lactancia puede ayudarla a encontrar un mtodo de extraccin que funcione mejor para usted y orientarla sobre cunto tiempo es seguro almacenar leche materna. Cmo cuidar las mamas durante la lactancia Los pezones pueden secarse, agrietarse y  doler durante la lactancia. Las siguientes recomendaciones pueden ayudarla a mantener las mamas humectadas y sanas:  Evite usar jabn en los pezones.  Use un sostn de soporte diseado especialmente para la lactancia materna. Evite usar sostenes con aro o sostenes muy ajustados (sostenes deportivos).  Seque al aire sus pezones durante 3 a 4minutos despus de amamantar al beb.  Utilice solo apsitos de algodn en el sostn para absorber las prdidas de leche. La prdida de un poco de leche materna   entre las tomas es normal.  Utilice lanolina sobre los pezones luego de amamantar. La lanolina ayuda a mantener la humedad normal de la piel. La lanolina pura no es perjudicial (no es txica) para el beb. Adems, puede extraer manualmente algunas gotas de leche materna y masajear suavemente esa leche sobre los pezones para que la leche se seque al aire.  Durante las primeras semanas despus del nacimiento, algunas mujeres experimentan congestin mamaria. La congestin mamaria puede hacer que sienta las mamas pesadas, calientes y sensibles al tacto. El pico de la congestin mamaria ocurre en el plazo de los 3 a 5 das despus del parto. Las siguientes recomendaciones pueden ayudarla a aliviar la congestin mamaria:  Vace por completo las mamas al amamantar o extraer leche. Puede aplicar calor hmedo en las mamas (en la ducha o con toallas hmedas para manos) antes de amamantar o extraer leche. Esto aumenta la circulacin y ayuda a que la leche fluya. Si el beb no vaca por completo las mamas cuando lo amamanta, extraiga la leche restante despus de que haya finalizado.  Aplique compresas de hielo sobre las mamas inmediatamente despus de amamantar o extraer leche, a menos que le resulte demasiado incmodo. Haga lo siguiente: ? Ponga el hielo en una bolsa plstica. ? Coloque una toalla entre la piel y la bolsa de hielo. ? Coloque el hielo durante 20minutos, 2 o 3veces por da.  Asegrese de que el  beb est prendido y se encuentre en la posicin correcta mientras lo alimenta.  Si la congestin mamaria persiste luego de 48 horas o despus de seguir estas recomendaciones, comunquese con su mdico o un asesor en lactancia. Recomendaciones de salud general durante la lactancia  Consuma 3 comidas y 3 colaciones saludables todos los das. Las madres bien alimentadas que amamantan necesitan entre 450 y 500 caloras adicionales por da. Puede cumplir con este requisito al aumentar la cantidad de una dieta equilibrada que realice.  Beba suficiente agua para mantener la orina clara o de color amarillo plido.  Descanse con frecuencia, reljese y siga tomando sus vitaminas prenatales para prevenir la fatiga, el estrs y los niveles bajos de vitaminas y minerales en el cuerpo (deficiencias de nutrientes).  No consuma ningn producto que contenga nicotina o tabaco, como cigarrillos y cigarrillos electrnicos. El beb puede verse afectado por las sustancias qumicas de los cigarrillos que pasan a la leche materna y por la exposicin al humo ambiental del tabaco. Si necesita ayuda para dejar de fumar, consulte al mdico.  Evite el consumo de alcohol.  No consuma drogas ilegales o marihuana.  Antes de usar cualquier medicamento, hable con el mdico. Estos incluyen medicamentos recetados y de venta libre, como tambin vitaminas y suplementos a base de hierbas. Algunos medicamentos, que pueden ser perjudiciales para el beb, pueden pasar a travs de la leche materna.  Puede quedar embarazada durante la lactancia. Si se desea un mtodo anticonceptivo, consulte al mdico sobre cules son las opciones seguras durante la lactancia. Dnde encontrar ms informacin: Liga internacional La Leche: www.llli.org. Comunquese con un mdico si:  Siente que quiere dejar de amamantar o se siente frustrada con la lactancia.  Sus pezones estn agrietados o sangran.  Sus mamas estn irritadas, sensibles o  calientes.  Tiene los siguientes sntomas: ? Dolor en las mamas o en los pezones. ? Un rea hinchada en cualquiera de las mamas. ? Fiebre o escalofros. ? Nuseas o vmitos. ? Drenaje de otro lquido distinto de la leche materna desde los pezones.    Sus mamas no se llenan antes de amamantar al beb para el quinto da despus del parto.  Se siente triste y deprimida.  El beb: ? Est demasiado somnoliento como para comer bien. ? Tiene problemas para dormir. ? Tiene ms de 1 semana de vida y moja menos de 6 paales en un periodo de 24 horas. ? No ha aumentado de peso a los 5 das de vida.  El beb defeca menos de 3 veces en 24 horas.  La piel del beb o las partes blancas de los ojos se vuelven amarillentas. Solicite ayuda de inmediato si:  El beb est muy cansado (letargo) y no se quiere despertar para comer.  Le sube la fiebre sin causa. Resumen  La lactancia materna ofrece muchos beneficios de salud para bebs y madres.  Intente amamantar a su beb cuando muestre signos tempranos de hambre.  Haga cosquillas o empuje suavemente los labios del beb con el dedo o el pezn para lograr que el beb abra la boca. Acerque el beb a la mama. Asegrese de que la mayor parte de la arola se encuentre dentro de la boca del beb. Ofrzcale una mama y haga eructar al beb antes de pasar a la otra.  Hable con su mdico o asesor en lactancia si tiene dudas o problemas con la lactancia. Esta informacin no tiene como fin reemplazar el consejo del mdico. Asegrese de hacerle al mdico cualquier pregunta que tenga. Document Released: 02/03/2005 Document Revised: 05/26/2016 Document Reviewed: 05/26/2016 Elsevier Interactive Patient Education  2018 [employer]  

## 2017-09-15 NOTE — Progress Notes (Signed)
   PRENATAL VISIT NOTE  Subjective:  Lacey Sanders is a 32 y.o. G3P2002 at 3117w5d being seen today for ongoing prenatal care.  She is currently monitored for the following issues for this low-risk pregnancy and has IBS (irritable bowel syndrome); GERD (gastroesophageal reflux disease); History of VBAC; Previous cesarean section complicating pregnancy; History of placenta previa; Supervision of low-risk pregnancy; Abnormal ultrasonic finding on antenatal screening of mother, antepartum; and Abnormal fetal ultrasound on their problem list.  Patient reports backache.  Contractions: Irritability. Vag. Bleeding: None.  Movement: Present. Denies leaking of fluid.   The following portions of the patient's history were reviewed and updated as appropriate: allergies, current medications, past family history, past medical history, past social history, past surgical history and problem list. Problem list updated.  Objective:   Vitals:   09/15/17 1013  BP: 118/62  Pulse: 89  Weight: 192 lb 4.8 oz (87.2 kg)    Fetal Status: Fetal Heart Rate (bpm): 153 Fundal Height: 33 cm Movement: Present     General:  Alert, oriented and cooperative. Patient is in no acute distress.  Skin: Skin is warm and dry. No rash noted.   Cardiovascular: Normal heart rate noted  Respiratory: Normal respiratory effort, no problems with respiration noted  Abdomen: Soft, gravid, appropriate for gestational age.  Pain/Pressure: Present     Pelvic: Cervical exam deferred        Extremities: Normal range of motion.  Edema: Trace  Mental Status: Normal mood and affect. Normal behavior. Normal judgment and thought content.   Assessment and Plan:  Pregnancy: G3P2002 at 3117w5d  1. Encounter for supervision of low-risk pregnancy in third trimester -Doing well; no complaints. Reviewed that back pain is normal; recommend Tylenol, heat and rest.   Preterm labor symptoms and general obstetric precautions including but not  limited to vaginal bleeding, contractions, leaking of fluid and fetal movement were reviewed in detail with the patient. Please refer to After Visit Summary for other counseling recommendations.  Return in about 2 weeks (around 09/29/2017), or LROB.  Future Appointments  Date Time Provider Department Center  09/29/2017 10:35 AM Jud[facility] HornLawrence[facility] Erin[facility]  10/13/2017  9:15 AM Marny LowensteinWenzel, Julie N, PA-C [facility]    Marylene LandKathryn Lorraine Kristoph [place], [place]

## 2017-09-29 ENCOUNTER — Encounter: Payer: Self-pay | Admitting: Student

## 2017-09-29 ENCOUNTER — Other Ambulatory Visit (HOSPITAL_COMMUNITY)
Admission: RE | Admit: 2017-09-29 | Discharge: 2017-09-29 | Disposition: A | Discharge status: Home / Self Care | Payer: Medicaid Other | Source: Ambulatory Visit | Attending: Family Medicine | Admitting: Family Medicine

## 2017-09-29 ENCOUNTER — Ambulatory Visit (INDEPENDENT_AMBULATORY_CARE_PROVIDER_SITE_OTHER): Payer: Medicaid Other | Admitting: Student

## 2017-09-29 VITALS — BP 111/65 | HR 79 | Wt 192.0 lb

## 2017-09-29 DIAGNOSIS — M5432 Sciatica, left side: Secondary | ICD-10-CM

## 2017-09-29 DIAGNOSIS — N898 Other specified noninflammatory disorders of vagina: Secondary | ICD-10-CM | POA: Insufficient documentation

## 2017-09-29 DIAGNOSIS — Z98891 History of uterine scar from previous surgery: Secondary | ICD-10-CM

## 2017-09-29 DIAGNOSIS — N949 Unspecified condition associated with female genital organs and menstrual cycle: Secondary | ICD-10-CM

## 2017-09-29 DIAGNOSIS — O34219 Maternal care for unspecified type scar from previous cesarean delivery: Secondary | ICD-10-CM

## 2017-09-29 DIAGNOSIS — Z3493 Encounter for supervision of normal pregnancy, unspecified, third trimester: Secondary | ICD-10-CM

## 2017-09-29 MED ORDER — TERCONAZOLE 0.4 % VA CREA: 1.0000 | TOPICAL_CREAM | Freq: Every day | VAGINAL | 0 refills | Status: AC

## 2017-09-29 NOTE — Progress Notes (Signed)
C/o of itchiness around her vaginal area, no complaint of burning with urination.

## 2017-09-29 NOTE — Patient Instructions (Signed)
Group B Streptococcus Infection During Pregnancy Group B Streptococcus (GBS) is a type of bacteria (Streptococcus agalactiae) that is often found in healthy people, commonly in the rectum, vagina, and intestines. In people who are healthy and not pregnant, the bacteria rarely cause serious illness or complications. However, women who test positive for GBS during pregnancy can pass the bacteria to their baby during childbirth, which can cause serious infection in the baby after birth. Women with GBS may also have infections during their pregnancy or immediately after childbirth, such as such as urinary tract infections (UTIs) or infections of the uterus (uterine infections). Having GBS also increases a woman's risk of complications during pregnancy, such as early (preterm) labor or delivery, miscarriage, or stillbirth. Routine testing (screening) for GBS is recommended for all pregnant women. What increases the risk? You may have a higher risk for GBS infection during pregnancy if you had one during a past pregnancy. What are the signs or symptoms? In most cases, GBS infection does not cause symptoms in pregnant women. Signs and symptoms of a possible GBS-related infection may include:  Labor starting before the 37th week of pregnancy.  A UTI or bladder infection, which may cause: ? Fever. ? Pain or burning during urination. ? Frequent urination.  Fever during labor, along with: ? Bad-smelling discharge. ? Uterine tenderness. ? Rapid heartbeat in the mother, baby, or both.  Rare but serious symptoms of a possible GBS-related infection in women include:  Blood infection (septicemia). This may cause fever, chills, or confusion.  Lung infection (pneumonia). This may cause fever, chills, cough, rapid breathing, difficulty breathing, or chest pain.  Bone, joint, skin, or soft tissue infection.  How is this diagnosed? You may be screened for GBS between week 35 and week 37 of your pregnancy. If  you have symptoms of preterm labor, you may be screened earlier. This condition is diagnosed based on lab test results from:  A swab of fluid from the vagina and rectum.  A urine sample.  How is this treated? This condition is treated with antibiotic medicine. When you go into labor, or as soon as your water breaks (your membranes rupture), you will be given antibiotics through an IV tube. Antibiotics will continue until after you give birth. If you are having a cesarean delivery, you do not need antibiotics unless your membranes have already ruptured. Follow these instructions at home:  Take over-the-counter and prescription medicines only as told by your health care provider.  Take your antibiotic medicine as told by your health care provider. Do not stop taking the antibiotic even if you start to feel better.  Keep all pre-birth (prenatal) visits and follow-up visits as told by your health care provider. This is important. Contact a health care provider if:  You have pain or burning when you urinate.  You have to urinate frequently.  You have a fever or chills.  You develop a bad-smelling vaginal discharge. Get help right away if:  Your membranes rupture.  You go into labor.  You have severe pain in your abdomen.  You have difficulty breathing.  You have chest pain. This information is not intended to replace advice given to you by your health care provider. Make sure you discuss any questions you have with your health care provider. Document Released: 05/13/2007 Document Revised: 08/31/2015 Document Reviewed: 08/30/2015 Elsevier Interactive Patient Education  2018 [employer].  Infeccin por estreptococo del grupoB durante el embarazo (Group B Streptococcus Infection During Pregnancy) El estreptococo del grupoB  es un tipo de bacteria (EGB) (Streptococcus agalactiae) que suele encontrarse en personas saludables, comnmente en el recto, la vagina y los intestinos. En  personas saludables y en mujeres no embarazadas, rara vez la bacteria provoca una enfermedad o complicaciones graves. Sin embargo, las mujeres [place] prueba de EGB es positiva durante el embarazo pueden transmitirle la bacteria al beb en el parto; esto puede provocar una infeccin grave en el beb despus del parto. Las mujeres con EGB tambin pueden tener infecciones durante el Psychiatristembarazo o inmediatamente despus del Coleraineparto, como infecciones de las vas urinarias (IVU) o infecciones del tero (infecciones uterinas). Tener EGB tambin H&R Blockaumenta el riesgo de complicaciones durante el Dolaembarazo, como parto o trabajo de parto prematuros, aborto espontneo o muerte fetal. Se recomienda que todas las embarazadas se hagan pruebas (exmenes de deteccin) de rutina para determinar la presencia del estreptococo del grupoB. FACTORES DE RIESGO Puede tener un mayor riesgo de contraer una infeccin por estreptococo del grupoB durante el embarazo si ya le ocurri en un embarazo previo. SNTOMAS En la International Business Machinesmayora de los casos, la infeccin por EGB no causa sntomas en las Loganembarazadas. Los signos y sntomas de una posible infeccin relacionada con el EGB pueden incluir los siguientes:  Inicio del Bettendorftrabajo de parto antes de la semana37 de gestacin.  Una infeccin de las vas urinarias o de la vejiga, que puede provocar: ? Grant RutsFiebre. ? Sensacin de dolor o ardor al ConocoPhillipsorinar. ? Ganas frecuentes de orinar.  Fiebre durante el Canutetrabajo de Spring Gardenparto, junto con: ? Una secrecin con mal olor. ? Dolor a Public affairs consultantla palpacin en el tero. ? Frecuencia cardaca acelerada en la madre, en el beb o en ambos. Los sntomas poco frecuentes pero graves de una posible infeccin relacionada con el EGB en las mujeres incluyen:  Infeccin en la sangre (septicemia). Esto puede provocar fiebre, escalofros o confusin.  Infeccin pulmonar (neumona). Esto puede provocar fiebre, escalofros, tos, respiracin rpida, dificultad para respirar o Engineer, manufacturingdolor en el  pecho.  Infeccin en los huesos, las articulaciones, la piel o los tejidos blandos. DIAGNSTICO Le pueden realizar exmenes para detectar el EGB entre la semana35 y la semana37 de gestacin. Si tiene sntomas de trabajo de Sport and exercise psychologistparto prematuro, Fish farm managerle pueden realizar los exmenes de deteccin antes. Esta afeccin se diagnostica a travs de los Andoverresultados de Jacksonhavenanlisis de laboratorio de:  Un hisopo con lquido de la vagina y Administratorel recto.  Lauris PoagUna muestra de Comorosorina. TRATAMIENTO Esta enfermedad se trata con antibiticos. Al comenzar el trabajo de parto o apenas rompe bolsa (ruptura de las Pinewood Estatesmembranas), le administrarn antibiticos a travs de una va intravenosa (IV). Seguir con antibiticos hasta despus del parto. Si tiene un parto por cesrea, no necesita antibiticos salvo que las membranas ya se hayan roto. INSTRUCCIONES PARA EL CUIDADO EN EL HOGAR  Tome los medicamentos de venta libre y los recetados solamente como se lo haya indicado el mdico.  Tome los antibiticos como se lo haya indicado el mdico. No deje de tomar los antibiticos aunque comience a sentirse mejor.  Concurra a todas las visitas previas al parto (prenatales) y las visitas de control como se lo haya indicado el mdico. Esto es importante. SOLICITE ATENCIN MDICA SI:  Siente dolor o ardor al Geographical information systems officerorinar.  Tiene necesidad de orinar con frecuencia.  Tiene fiebre o siente escalofros.  Tiene una secrecin vaginal con mal olor. SOLICITE ATENCIN MDICA DE INMEDIATO SI:  Se produce la ruptura de las Skyline Viewmembranas.  Comienza el trabajo de Sultanparto.  Siente un dolor intenso en  el abdomen.  Tiene dificultad para respirar.  Siente dolor en el pecho. Esta informacin no tiene Theme park managercomo fin reemplazar el consejo del mdico. Asegrese de hacerle al mdico cualquier pregunta que tenga. Document Released: 07/23/2007 Document Revised: 02/08/2013 Document Reviewed: 08/30/2015 Elsevier Interactive Patient Education  [employer]2018 Elsevier Inc.

## 2017-09-29 NOTE — Progress Notes (Signed)
   PRENATAL VISIT NOTE  Subjective:  Lacey Sanders is a 32 y.o. G3P2002 at 7151w5d being seen today for ongoing prenatal care.  She is currently monitored for the following issues for this low-risk pregnancy and has IBS (irritable bowel syndrome); GERD (gastroesophageal reflux disease); History of VBAC; Previous cesarean section complicating pregnancy; History of placenta previa; Supervision of low-risk pregnancy; and Abnormal ultrasonic finding on antenatal screening of mother, antepartum on their problem list.   Vaginal itching - changes in soaps - started a few days ago - noticed will have small amount of urine leakage when waits too long to go to the bathroom - then wet underwear and itching - no burning or pain with urination - no history of multiple UTIs  Back Pain - left buttock, radiates a little bit down thigh - no weakness - only in morning or when not moving for a long time  - feels better within an hour, after moving around - didn't have in other pregnancies - feels like carrying baby much lower this time   Patient reports backache, vaginal irritation and bilateral lower abdominal pain.  Contractions: Irritability. Vag. Bleeding: None.  Movement: Present. Denies leaking of fluid.   The following portions of the patient's history were reviewed and updated as appropriate: allergies, current medications, past family history, past medical history, past social history, past surgical history and problem list. Problem list updated.  Objective:   Vitals:   09/29/17 1052  BP: 111/65  Pulse: 79  Weight: 192 lb (87.1 kg)    Fetal Status: Fetal Heart Rate (bpm): 141   Movement: Present     General:  Alert, oriented and cooperative. Patient is in no acute distress.  Skin: Skin is warm and dry. No rash noted.   Cardiovascular: Normal heart rate noted  Respiratory: Normal respiratory effort, no problems with respiration noted  Abdomen: Soft, gravid, appropriate for  gestational age.  Pain/Pressure: Present     Pelvic: Cervical exam deferred       erythematous appearing introitus, scant white, thin discharge   Extremities: Normal range of motion.  Edema: Trace  Mental Status: Normal mood and affect. Normal behavior. Normal judgment and thought content.   Assessment and Plan:  Pregnancy: G3P2002 at 8051w5d  Encounter for supervision of low-risk pregnancy in third trimester -- continue routine care  Previous cesarean section complicating pregnancy  History of VBAC -- consent signed into chart   Vaginal Itching: Most consistent with yeast infection.  -- terazole daily x 3 days -- will call with wet prep results   Sciatica, Round Ligament Pain -- advised warm, moist heat -- counseled on use of exercise ball, movement/stretches   Preterm labor symptoms and general obstetric precautions including but not limited to vaginal bleeding, contractions, leaking of fluid and fetal movement were reviewed in detail with the patient. Please refer to After Visit Summary for other counseling recommendations.   Return in about 2 weeks (around 10/13/2017).  Future Appointments  Date Time Provider Department Center  10/13/2017  9:15 AM Marny LowensteinWenzel, Julie N, PA-C [facility]    [address]ands[address] [place], [place]

## 2017-09-30 LAB — CERVICOVAGINAL ANCILLARY ONLY: Candida vaginitis: POSITIVE — AB

## 2017-10-13 ENCOUNTER — Other Ambulatory Visit (HOSPITAL_COMMUNITY)
Admission: RE | Admit: 2017-10-13 | Discharge: 2017-10-13 | Disposition: A | Discharge status: Home / Self Care | Payer: Medicaid Other | Source: Ambulatory Visit | Attending: Medical | Admitting: Medical

## 2017-10-13 ENCOUNTER — Ambulatory Visit (INDEPENDENT_AMBULATORY_CARE_PROVIDER_SITE_OTHER): Payer: Medicaid Other | Admitting: Medical

## 2017-10-13 VITALS — BP 123/70 | HR 87 | Wt 196.1 lb

## 2017-10-13 DIAGNOSIS — O34219 Maternal care for unspecified type scar from previous cesarean delivery: Secondary | ICD-10-CM

## 2017-10-13 DIAGNOSIS — Z3493 Encounter for supervision of normal pregnancy, unspecified, third trimester: Secondary | ICD-10-CM | POA: Diagnosis not present

## 2017-10-13 DIAGNOSIS — Z3483 Encounter for supervision of other normal pregnancy, third trimester: Secondary | ICD-10-CM

## 2017-10-13 DIAGNOSIS — Z98891 History of uterine scar from previous surgery: Secondary | ICD-10-CM

## 2017-10-13 NOTE — Progress Notes (Signed)
   PRENATAL VISIT NOTE  Subjective:  Lacey Sanders is a 32 y.o. G3P2002 at 5830w5d being seen today for ongoing prenatal care.  She is currently monitored for the following issues for this high-risk pregnancy and has IBS (irritable bowel syndrome); GERD (gastroesophageal reflux disease); History of VBAC; Previous cesarean section complicating pregnancy; History of placenta previa; Supervision of low-risk pregnancy; and Abnormal ultrasonic finding on antenatal screening of mother, antepartum on their problem list.  Patient reports occasional contractions.  Contractions: Irritability. Vag. Bleeding: None.  Movement: Present. Denies leaking of fluid.   The following portions of the patient's history were reviewed and updated as appropriate: allergies, current medications, past family history, past medical history, past social history, past surgical history and problem list. Problem list updated.  Objective:   Vitals:   10/13/17 0920  BP: 123/70  Pulse: 87  Weight: 196 lb 1.6 oz (89 kg)    Fetal Status: Fetal Heart Rate (bpm): 153 Fundal Height: 39 cm Movement: Present  Presentation: Vertex  General:  Alert, oriented and cooperative. Patient is in no acute distress.  Skin: Skin is warm and dry. No rash noted.   Cardiovascular: Normal heart rate noted  Respiratory: Normal respiratory effort, no problems with respiration noted  Abdomen: Soft, gravid, appropriate for gestational age.  Pain/Pressure: Present     Pelvic: Cervical exam performed Dilation: Fingertip Effacement (%): 50 Station: -3  Extremities: Normal range of motion.  Edema: None  Mental Status: Normal mood and affect. Normal behavior. Normal judgment and thought content.   Assessment and Plan:  Pregnancy: G3P2002 at 6130w5d  1. Encounter for supervision of low-risk pregnancy in third trimester - [facility]lam[facility] - Culture, beta strep (group b only)  2. Previous cesarean section  complicating pregnancy - Plans repeat VBAC, consent signed   3. History of VBAC  Preterm labor symptoms and general obstetric precautions including but not limited to vaginal bleeding, contractions, leaking of fluid and fetal movement were reviewed in detail with the patient. Please refer to After Visit Summary for other counseling recommendations.  Return in about 1 week (around 10/20/2017) for LOB.  Vonzella NippleJulie Ariadna Setter, PA-C

## 2017-10-13 NOTE — Patient Instructions (Signed)
Research childbirth classes and hospital preregistration at [email or web address]   Evaluacin de los movimientos fetales Fetal Movement Counts Introduccin Nombre del paciente: ________________________________________________ Lacey ChapmanFecha de parto estimada: ____________________ Lacey BerryQu es una evaluacin de los movimientos fetales? Una evaluacin de los movimientos fetales es el registro del nmero de veces que siente que el beb se mueve durante un cierto perodo de Lacey Sanders. Esto tambin se puede denominar recuento de patadas fetales. Una evaluacin de movimientos fetales se recomienda a todas las embarazadas. Es posible que le indiquen que comience a Lacey Sanders, Lacey Sanders los movimientos fetales desde la semana 28 de Lorettoembarazo. Preste atencin cuando sienta que el beb est ms activo. Podr detectar los ciclos en que el beb duerme y est despierto. Tambin podr detectar que ciertas cosas hacen que su beb se mueva ms. Deber realizar una evaluacin de los movimientos fetales en las siguientes situaciones:  Cuando el beb est ms activo habitualmente.  A la Lacey Corporationmisma hora, todos los Lacey Sanders.  Un buen momento para evaluar los movimientos fetales es cuando est descansando, despus de haber comido y bebido algo. Cmo debo contar los movimientos fetales? 1. Encuentre un lugar tranquilo y cmodo. Sintese o acustese de lado. 2. Anote la fecha, la hora de inicio y de finalizacin y la cantidad de movimientos que sinti entre esas dos horas. Lleve esta informacin a las visitas de control. 3. Cuente las pataditas, revoloteos, chasquidos, vueltas o pinchazos en un perodo de 2horas. Debe sentir al menos 10movimientos en 2horas. 4. Cuando sienta 10movimientos, puede dejar de contar. 5. Si no siente 10movimientos en 2horas, coma y beba algo. Luego, contine descansando y Lacey Sanders durante 1hora. Si siente al menos 4movimientos durante esa hora, puede dejar de contar. Comunquese con un mdico si:  Siente menos de  4movimientos en 2horas.  El beb no se mueve tanto como suele hacerlo. Fecha: ____________ Lacey BornHora de inicio: ____________ Lacey BornHora de finalizacin: ____________ Movimientos: ____________ Lacey NonesFecha: ____________ Lacey BornHora de inicio: ____________ Lacey BornHora de finalizacin: ____________ Movimientos: ____________ Lacey NonesFecha: ____________ Lacey BornHora de inicio: ____________ Lacey BornHora de finalizacin: ____________ Movimientos: ____________ Lacey NonesFecha: ____________ Lacey BornHora de inicio: ____________ Lacey BornHora de finalizacin: ____________ Movimientos: ____________ Lacey NonesFecha: ____________ Lacey BornHora de inicio: ____________ Lacey RussianHora de finalizacin: ____________ Movimientos: ____________ Lacey NonesFecha: ____________ Lacey BornHora de inicio: ____________ Lacey RussianHora de finalizacin: ____________ Movimientos: ____________ Lacey NonesFecha: ____________ Lacey BornHora de inicio: ____________ Lacey RussianHora de finalizacin: ____________ Movimientos: ____________ Lacey NonesFecha: ____________ Lacey BornHora de inicio: ____________ Lacey BornHora de finalizacin: ____________ Movimientos: ____________ Lacey NonesFecha: ____________ Lacey BornHora de inicio: ____________ Lacey RussianHora de finalizacin: ____________ Movimientos: ____________ Esta informacin no tiene como fin reemplazar el consejo del mdico. Asegrese de hacerle al mdico cualquier pregunta que tenga. Document Released: 05/13/2007 Document Revised: 05/09/2016 Document Reviewed: 03/15/2015 Elsevier Interactive Patient Education  [employer]2018 Elsevier Inc.

## 2017-10-14 LAB — GC/CHLAMYDIA PROBE AMP (~~LOC~~) NOT AT ARMC
Chlamydia: NEGATIVE
Neisseria Gonorrhea: NEGATIVE

## 2017-10-17 LAB — CULTURE, BETA STREP (GROUP B ONLY): STREP GP B CULTURE: POSITIVE — AB

## 2017-10-20 ENCOUNTER — Ambulatory Visit (INDEPENDENT_AMBULATORY_CARE_PROVIDER_SITE_OTHER): Payer: Medicaid Other | Admitting: Nurse Practitioner

## 2017-10-20 VITALS — BP 116/61 | HR 82 | Wt 198.2 lb

## 2017-10-20 DIAGNOSIS — O34219 Maternal care for unspecified type scar from previous cesarean delivery: Secondary | ICD-10-CM

## 2017-10-20 DIAGNOSIS — Z3493 Encounter for supervision of normal pregnancy, unspecified, third trimester: Secondary | ICD-10-CM

## 2017-10-20 DIAGNOSIS — Z3490 Encounter for supervision of normal pregnancy, unspecified, unspecified trimester: Secondary | ICD-10-CM

## 2017-10-20 NOTE — Progress Notes (Signed)
Pt states for 3 Or 4 days ago her belly button was hurting a lot with the baby moving.

## 2017-10-20 NOTE — Patient Instructions (Signed)
Infeccin por estreptococo del grupoB durante el embarazo (Group B Streptococcus Infection During Pregnancy) El estreptococo del grupoB es un tipo de bacteria (EGB) (Streptococcus agalactiae) que suele encontrarse en personas saludables, comnmente en el recto, la vagina y los intestinos. En personas saludables y en mujeres no embarazadas, rara vez la bacteria provoca una enfermedad o complicaciones graves. Sin embargo, las mujeres cuya prueba de EGB es positiva durante el embarazo pueden transmitirle la bacteria al beb en el parto; esto puede provocar una infeccin grave en el beb despus del parto. Las mujeres con EGB tambin pueden tener infecciones durante el embarazo o inmediatamente despus del parto, como infecciones de las vas urinarias (IVU) o infecciones del tero (infecciones uterinas). Tener EGB tambin aumenta el riesgo de complicaciones durante el embarazo, como parto o trabajo de parto prematuros, aborto espontneo o muerte fetal. Se recomienda que todas las embarazadas se hagan pruebas (exmenes de deteccin) de rutina para determinar la presencia del estreptococo del grupoB. FACTORES DE RIESGO Puede tener un mayor riesgo de contraer una infeccin por estreptococo del grupoB durante el embarazo si ya le ocurri en un embarazo previo. SNTOMAS En la mayora de los casos, la infeccin por EGB no causa sntomas en las embarazadas. Los signos y sntomas de una posible infeccin relacionada con el EGB pueden incluir los siguientes:  Inicio del trabajo de parto antes de la semana37 de gestacin.  Una infeccin de las vas urinarias o de la vejiga, que puede provocar: ? Fiebre. ? Sensacin de dolor o ardor al orinar. ? Ganas frecuentes de orinar.  Fiebre durante el trabajo de parto, junto con: ? Una secrecin con mal olor. ? Dolor a la palpacin en el tero. ? Frecuencia cardaca acelerada en la madre, en el beb o en ambos. Los sntomas poco frecuentes pero graves de una posible  infeccin relacionada con el EGB en las mujeres incluyen:  Infeccin en la sangre (septicemia). Esto puede provocar fiebre, escalofros o confusin.  Infeccin pulmonar (neumona). Esto puede provocar fiebre, escalofros, tos, respiracin rpida, dificultad para respirar o dolor en el pecho.  Infeccin en los huesos, las articulaciones, la piel o los tejidos blandos. DIAGNSTICO Le pueden realizar exmenes para detectar el EGB entre la semana35 y la semana37 de gestacin. Si tiene sntomas de trabajo de parto prematuro, le pueden realizar los exmenes de deteccin antes. Esta afeccin se diagnostica a travs de los resultados de anlisis de laboratorio de:  Un hisopo con lquido de la vagina y el recto.  Una muestra de orina. TRATAMIENTO Esta enfermedad se trata con antibiticos. Al comenzar el trabajo de parto o apenas rompe bolsa (ruptura de las membranas), le administrarn antibiticos a travs de una va intravenosa (IV). Seguir con antibiticos hasta despus del parto. Si tiene un parto por cesrea, no necesita antibiticos salvo que las membranas ya se hayan roto. INSTRUCCIONES PARA EL CUIDADO EN EL HOGAR  Tome los medicamentos de venta libre y los recetados solamente como se lo haya indicado el mdico.  Tome los antibiticos como se lo haya indicado el mdico. No deje de tomar los antibiticos aunque comience a sentirse mejor.  Concurra a todas las visitas previas al parto (prenatales) y las visitas de control como se lo haya indicado el mdico. Esto es importante. SOLICITE ATENCIN MDICA SI:  Siente dolor o ardor al orinar.  Tiene necesidad de orinar con frecuencia.  Tiene fiebre o siente escalofros.  Tiene una secrecin vaginal con mal olor. SOLICITE ATENCIN MDICA DE INMEDIATO SI:  Se produce   la ruptura de las [place].  Comienza el trabajo de [place].  Siente un dolor intenso en el abdomen.  Tiene dificultad para respirar.  Siente dolor en el pecho. Esta  informacin no tiene Theme park manager el consejo del mdico. Asegrese de hacerle al mdico cualquier pregunta que tenga. Document Released: 07/23/2007 Document Revised: 02/08/2013 Document Reviewed: 08/30/2015 Elsevier Interactive Patient Education  [employer].

## 2017-10-20 NOTE — Progress Notes (Signed)
    Subjective:  Lacey Sanders is a 32 y.o. G3P2002 at [redacted]w[redacted]d being seen today for ongoing prenatal care.  She is currently monitored for the following issues for this low-risk pregnancy and has IBS (irritable bowel syndrome); GERD (gastroesophageal reflux disease); History of VBAC; Previous cesarean section complicating pregnancy; History of placenta previa; Supervision of low-risk pregnancy; and Abnormal ultrasonic finding on antenatal screening of mother, antepartum on their problem list.  Patient reports pain in belly button when baby is moving.  Contractions: Irritability. Vag. Bleeding: None.  Movement: Present. Denies leaking of fluid.   The following portions of the patient's history were reviewed and updated as appropriate: allergies, current medications, past family history, past medical history, past social history, past surgical history and problem list. Problem list updated.  Objective:   Vitals:   10/20/17 1049  BP: 116/61  Pulse: 82  Weight: 198 lb 3.2 oz (89.9 kg)    Fetal Status: Fetal Heart Rate (bpm): 156 Fundal Height: 40 cm Movement: Present     General:  Alert, oriented and cooperative. Patient is in no acute distress.  Skin: Skin is warm and dry. No rash noted.   Cardiovascular: Normal heart rate noted  Respiratory: Normal respiratory effort, no problems with respiration noted  Abdomen: Soft, gravid, appropriate for gestational age. Pain/Pressure: Present     Pelvic:  Cervical exam deferred        Extremities: Normal range of motion.  Edema: Trace  Mental Status: Normal mood and affect. Normal behavior. Normal judgment and thought content.   Urinalysis:      Assessment and Plan:  Pregnancy: G3P2002 at [redacted]w[redacted]d  1. Encounter for supervision of low-risk pregnancy, antepartum Doing well.  Very few contractions but baby is moving well. Interpreter present for entire visit. Pain in belly button is normal due to stretching of abdominal muscles.  2.  Previous cesarean section complicating pregnancy Plans VBAC  3. Positive GBS Info discussed with mother   Term labor symptoms and general obstetric precautions including but not limited to vaginal bleeding, contractions, leaking of fluid and fetal movement were reviewed in detail with the patient. Please refer to After Visit Summary for other counseling recommendations.  Return in about 1 week (around 10/27/2017).  Nolene Bernheim, RN, MSN, NP-BC Nurse Practitioner, [facility] for [employer], [facility] 10/20/2017 11:26 AM

## 2017-10-28 ENCOUNTER — Encounter: Payer: Self-pay | Admitting: Medical

## 2017-10-28 ENCOUNTER — Ambulatory Visit (INDEPENDENT_AMBULATORY_CARE_PROVIDER_SITE_OTHER): Payer: Medicaid Other | Admitting: Medical

## 2017-10-28 VITALS — BP 117/66 | HR 78 | Wt 199.7 lb

## 2017-10-28 DIAGNOSIS — Z98891 History of uterine scar from previous surgery: Secondary | ICD-10-CM

## 2017-10-28 DIAGNOSIS — Z23 Encounter for immunization: Secondary | ICD-10-CM

## 2017-10-28 DIAGNOSIS — O34219 Maternal care for unspecified type scar from previous cesarean delivery: Secondary | ICD-10-CM

## 2017-10-28 DIAGNOSIS — Z3493 Encounter for supervision of normal pregnancy, unspecified, third trimester: Secondary | ICD-10-CM

## 2017-10-28 DIAGNOSIS — O98819 Other maternal infectious and parasitic diseases complicating pregnancy, unspecified trimester: Secondary | ICD-10-CM

## 2017-10-28 DIAGNOSIS — O283 Abnormal ultrasonic finding on antenatal screening of mother: Secondary | ICD-10-CM

## 2017-10-28 DIAGNOSIS — Z3490 Encounter for supervision of normal pregnancy, unspecified, unspecified trimester: Secondary | ICD-10-CM

## 2017-10-28 DIAGNOSIS — O98813 Other maternal infectious and parasitic diseases complicating pregnancy, third trimester: Secondary | ICD-10-CM

## 2017-10-28 DIAGNOSIS — B951 Streptococcus, group B, as the cause of diseases classified elsewhere: Secondary | ICD-10-CM | POA: Insufficient documentation

## 2017-10-28 NOTE — Patient Instructions (Signed)
Research childbirth classes and hospital preregistration at [email or web address]  Fetal Movement Counts Patient Name: ________________________________________________ Patient Due Date: ____________________ What is a fetal movement count? A fetal movement count is the number of times that you feel your baby move during a certain amount of time. This may also be called a fetal kick count. A fetal movement count is recommended for every pregnant woman. You may be asked to start counting fetal movements as early as week 28 of your pregnancy. Pay attention to when your baby is most active. You may notice your baby's sleep and wake cycles. You may also notice things that make your baby move more. You should do a fetal movement count:  When your baby is normally most active.  At the same time each day.  A good time to count movements is while you are resting, after having something to eat and drink. How do I count fetal movements? 1. Find a quiet, comfortable area. Sit, or lie down on your side. 2. Write down the date, the start time and stop time, and the number of movements that you felt between those two times. Take this information with you to your health care visits. 3. For 2 hours, count kicks, flutters, swishes, rolls, and jabs. You should feel at least 10 movements during 2 hours. 4. You may stop counting after you have felt 10 movements. 5. If you do not feel 10 movements in 2 hours, have something to eat and drink. Then, keep resting and counting for 1 hour. If you feel at least 4 movements during that hour, you may stop counting. Contact a health care provider if:  You feel fewer than 4 movements in 2 hours.  Your baby is not moving like he or she usually does. Date: ____________ Start time: ____________ Stop time: ____________ Movements: ____________ Date: ____________ Start time: ____________ Stop time: ____________ Movements: ____________ Date: ____________ Start time: ____________  Stop time: ____________ Movements: ____________ Date: ____________ Start time: ____________ Stop time: ____________ Movements: ____________ Date: ____________ Start time: ____________ Stop time: ____________ Movements: ____________ Date: ____________ Start time: ____________ Stop time: ____________ Movements: ____________ Date: ____________ Start time: ____________ Stop time: ____________ Movements: ____________ Date: ____________ Start time: ____________ Stop time: ____________ Movements: ____________ Date: ____________ Start time: ____________ Stop time: ____________ Movements: ____________ This information is not intended to replace advice given to you by your health care provider. Make sure you discuss any questions you have with your health care provider. Document Released: 03/05/2006 Document Revised: 10/03/2015 Document Reviewed: 03/15/2015 Elsevier Interactive Patient Education  2018 [employer]  Braxton Hicks Contractions Contractions of the uterus can occur throughout pregnancy, but they are not always a sign that you are in labor. You may have practice contractions called Braxton Hicks contractions. These false labor contractions are sometimes confused with true labor. What are Braxton Hicks contractions? Braxton Hicks contractions are tightening movements that occur in the muscles of the uterus before labor. Unlike true labor contractions, these contractions do not result in opening (dilation) and thinning of the cervix. Toward the end of pregnancy (32-34 weeks), Braxton Hicks contractions can happen more often and may become stronger. These contractions are sometimes difficult to tell apart from true labor because they can be very uncomfortable. You should not feel embarrassed if you go to the hospital with false labor. Sometimes, the only way to tell if you are in true labor is for your health care provider to look for changes in the cervix. The health care provider will   do a physical  exam and may monitor your contractions. If you are not in true labor, the exam should show that your cervix is not dilating and your water has not broken. If there are other health problems associated with your pregnancy, it is completely safe for you to be sent home with false labor. You may continue to have Braxton Hicks contractions until you go into true labor. How to tell the difference between true labor and false labor True labor  Contractions last 30-70 seconds.  Contractions become very regular.  Discomfort is usually felt in the top of the uterus, and it spreads to the lower abdomen and low back.  Contractions do not go away with walking.  Contractions usually become more intense and increase in frequency.  The cervix dilates and gets thinner. False labor  Contractions are usually shorter and not as strong as true labor contractions.  Contractions are usually irregular.  Contractions are often felt in the front of the lower abdomen and in the groin.  Contractions may go away when you walk around or change positions while lying down.  Contractions get weaker and are shorter-lasting as time goes on.  The cervix usually does not dilate or become thin. Follow these instructions at home:  Take over-the-counter and prescription medicines only as told by your health care provider.  Keep up with your usual exercises and follow other instructions from your health care provider.  Eat and drink lightly if you think you are going into labor.  If Braxton Hicks contractions are making you uncomfortable: ? Change your position from lying down or resting to walking, or change from walking to resting. ? Sit and rest in a tub of warm water. ? Drink enough fluid to keep your urine pale yellow. Dehydration may cause these contractions. ? Do slow and deep breathing several times an hour.  Keep all follow-up prenatal visits as told by your health care provider. This is  important. Contact a health care provider if:  You have a fever.  You have continuous pain in your abdomen. Get help right away if:  Your contractions become stronger, more regular, and closer together.  You have fluid leaking or gushing from your vagina.  You pass blood-tinged mucus (bloody show).  You have bleeding from your vagina.  You have low back pain that you never had before.  You feel your baby's head pushing down and causing pelvic pressure.  Your baby is not moving inside you as much as it used to. Summary  Contractions that occur before labor are called Braxton Hicks contractions, false labor, or practice contractions.  Braxton Hicks contractions are usually shorter, weaker, farther apart, and less regular than true labor contractions. True labor contractions usually become progressively stronger and regular and they become more frequent.  Manage discomfort from Braxton Hicks contractions by changing position, resting in a warm bath, drinking plenty of water, or practicing deep breathing. This information is not intended to replace advice given to you by your health care provider. Make sure you discuss any questions you have with your health care provider. Document Released: 06/19/2016 Document Revised: 06/19/2016 Document Reviewed: 06/19/2016 Elsevier Interactive Patient Education  2018 Elsevier Inc.    

## 2017-10-28 NOTE — Progress Notes (Signed)
   PRENATAL VISIT NOTE  Subjective:  Lacey Sanders is a 32 y.o. G3P2002 at [redacted]w[redacted]d being seen today for ongoing prenatal care.  She is currently monitored for the following issues for this low-risk pregnancy and has IBS (irritable bowel syndrome); GERD (gastroesophageal reflux disease); History of VBAC; Previous cesarean section complicating pregnancy; History of placenta previa; Supervision of low-risk pregnancy; Abnormal ultrasonic finding on antenatal screening of mother, antepartum; and Group B streptococcal infection during pregnancy on their problem list.  Patient reports no complaints.  Contractions: Irregular. Vag. Bleeding: None.  Movement: Present. Denies leaking of fluid.   The following portions of the patient's history were reviewed and updated as appropriate: allergies, current medications, past family history, past medical history, past social history, past surgical history and problem list. Problem list updated.  Objective:   Vitals:   10/28/17 0939  BP: 117/66  Pulse: 78  Weight: 199 lb 11.2 oz (90.6 kg)    Fetal Status: Fetal Heart Rate (bpm): 135 Fundal Height: 41 cm Movement: Present     General:  Alert, oriented and cooperative. Patient is in no acute distress.  Skin: Skin is warm and dry. No rash noted.   Cardiovascular: Normal heart rate noted  Respiratory: Normal respiratory effort, no problems with respiration noted  Abdomen: Soft, gravid, appropriate for gestational age.  Pain/Pressure: Present     Pelvic: Cervical exam deferred        Extremities: Normal range of motion.  Edema: Trace  Mental Status: Normal mood and affect. Normal behavior. Normal judgment and thought content.   Assessment and Plan:  Pregnancy: G3P2002 at [redacted]w[redacted]d  1. Encounter for supervision of low-risk pregnancy, antepartum - Flu Vaccine QUAD 36+ mos IM - +GBS discussed, treat in labor  2. Abnormal ultrasonic finding on antenatal screening of mother, antepartum - UTD A1 -  CPC noted initially, resolving on last [place] - No follow-up indicated by MFM   3. History of VBAC - Planning repeat VBAC  4. Previous cesarean section complicating pregnancy - Planning repeat VBAC  5. Group B strep positive - Treat in labor  Term labor symptoms and general obstetric precautions including but not limited to vaginal bleeding, contractions, leaking of fluid and fetal movement were reviewed in detail with the patient. Please refer to After Visit Summary for other counseling recommendations.  Return in about 1 week (around 11/04/2017) for LOB.  Future Appointments  Date Time Provider Department Center  11/02/2017  9:35 AM Armando Reichert, CNM [facility]    Vonzella Nipple, PA-C

## 2017-11-02 ENCOUNTER — Ambulatory Visit (INDEPENDENT_AMBULATORY_CARE_PROVIDER_SITE_OTHER): Payer: Medicaid Other | Admitting: Advanced Practice Midwife

## 2017-11-02 ENCOUNTER — Encounter: Payer: Self-pay | Admitting: Advanced Practice Midwife

## 2017-11-02 ENCOUNTER — Telehealth (HOSPITAL_COMMUNITY): Payer: Self-pay | Admitting: *Deleted

## 2017-11-02 ENCOUNTER — Encounter (HOSPITAL_COMMUNITY): Payer: Self-pay | Admitting: *Deleted

## 2017-11-02 VITALS — BP 120/76 | HR 89 | Wt 199.9 lb

## 2017-11-02 DIAGNOSIS — Z3490 Encounter for supervision of normal pregnancy, unspecified, unspecified trimester: Secondary | ICD-10-CM

## 2017-11-02 DIAGNOSIS — Z3493 Encounter for supervision of normal pregnancy, unspecified, third trimester: Secondary | ICD-10-CM

## 2017-11-02 NOTE — Telephone Encounter (Signed)
[phone] interpreter number Preadmission screen

## 2017-11-02 NOTE — Addendum Note (Signed)
Addended by: Thressa ShellerHOGAN, HEATHER D on: 11/02/2017 11:12 AM   Modules accepted: Orders, SmartSet

## 2017-11-02 NOTE — Progress Notes (Signed)
   PRENATAL VISIT NOTE  Subjective:  Lacey Sanders is a 32 y.o. G3P2002 at 3929w4d being seen today for ongoing prenatal care.  She is currently monitored for the following issues for this low-risk pregnancy and has IBS (irritable bowel syndrome); GERD (gastroesophageal reflux disease); History of VBAC; Previous cesarean section complicating pregnancy; History of placenta previa; Supervision of low-risk pregnancy; Abnormal ultrasonic finding on antenatal screening of mother, antepartum; and Group B streptococcal infection during pregnancy on their problem list.  Patient reports backache.  Contractions: Irregular. Vag. Bleeding: None.  Movement: Present. Denies leaking of fluid.   The following portions of the patient's history were reviewed and updated as appropriate: allergies, current medications, past family history, past medical history, past social history, past surgical history and problem list. Problem list updated.  Objective:   Vitals:   11/02/17 0958  BP: 120/76  Pulse: 89  Weight: 199 lb 14.4 oz (90.7 kg)    Fetal Status: Fetal Heart Rate (bpm): 147 Fundal Height: 41 cm Movement: Present     General:  Alert, oriented and cooperative. Patient is in no acute distress.  Skin: Skin is warm and dry. No rash noted.   Cardiovascular: Normal heart rate noted  Respiratory: Normal respiratory effort, no problems with respiration noted  Abdomen: Soft, gravid, appropriate for gestational age.  Pain/Pressure: Present     Pelvic: Cervical exam performed Dilation: 1 Effacement (%): Thick Station: -3  Extremities: Normal range of motion.  Edema: None  Mental Status: Normal mood and affect. Normal behavior. Normal judgment and thought content.   Assessment and Plan:  Pregnancy: G3P2002 at 8329w4d  1. Encounter for supervision of low-risk pregnancy, antepartum - Routine care - BPP/NST next week - IOL scheduled for 9/26 at 0700  Term labor symptoms and general obstetric  precautions including but not limited to vaginal bleeding, contractions, leaking of fluid and fetal movement were reviewed in detail with the patient. Please refer to After Visit Summary for other counseling recommendations.  Return in about 1 week (around 11/09/2017) for post dates BPP/NST.  Future Appointments  Date Time Provider Department Center  11/12/2017  7:00 AM [facility] None    Thressa ShellerHeather Mileidy Atkin, CNM

## 2017-11-09 ENCOUNTER — Inpatient Hospital Stay (HOSPITAL_COMMUNITY): Payer: Medicaid Other | Admitting: Anesthesiology

## 2017-11-09 ENCOUNTER — Other Ambulatory Visit: Payer: Medicaid Other

## 2017-11-09 ENCOUNTER — Inpatient Hospital Stay (HOSPITAL_COMMUNITY)
Admission: AD | Admit: 2017-11-09 | Discharge: 2017-11-11 | DRG: 807 | Disposition: A | Discharge status: Home / Self Care | Payer: Medicaid Other | Attending: Obstetrics and Gynecology | Admitting: Obstetrics and Gynecology

## 2017-11-09 ENCOUNTER — Encounter (HOSPITAL_COMMUNITY): Payer: Self-pay | Admitting: *Deleted

## 2017-11-09 DIAGNOSIS — O98819 Other maternal infectious and parasitic diseases complicating pregnancy, unspecified trimester: Secondary | ICD-10-CM

## 2017-11-09 DIAGNOSIS — Z3A40 40 weeks gestation of pregnancy: Secondary | ICD-10-CM

## 2017-11-09 DIAGNOSIS — O99824 Streptococcus B carrier state complicating childbirth: Secondary | ICD-10-CM | POA: Diagnosis present

## 2017-11-09 DIAGNOSIS — Z98891 History of uterine scar from previous surgery: Secondary | ICD-10-CM

## 2017-11-09 DIAGNOSIS — Z3483 Encounter for supervision of other normal pregnancy, third trimester: Secondary | ICD-10-CM | POA: Diagnosis present

## 2017-11-09 DIAGNOSIS — O34219 Maternal care for unspecified type scar from previous cesarean delivery: Secondary | ICD-10-CM | POA: Diagnosis present

## 2017-11-09 DIAGNOSIS — O283 Abnormal ultrasonic finding on antenatal screening of mother: Secondary | ICD-10-CM | POA: Diagnosis present

## 2017-11-09 DIAGNOSIS — Z8759 Personal history of other complications of pregnancy, childbirth and the puerperium: Secondary | ICD-10-CM

## 2017-11-09 DIAGNOSIS — Z3490 Encounter for supervision of normal pregnancy, unspecified, unspecified trimester: Secondary | ICD-10-CM

## 2017-11-09 DIAGNOSIS — B951 Streptococcus, group B, as the cause of diseases classified elsewhere: Secondary | ICD-10-CM | POA: Diagnosis present

## 2017-11-09 DIAGNOSIS — O480 Post-term pregnancy: Secondary | ICD-10-CM | POA: Diagnosis present

## 2017-11-09 LAB — TYPE AND SCREEN
ABO/RH(D): O POS
Antibody Screen: NEGATIVE

## 2017-11-09 LAB — CBC
HCT: 39.3 % (ref 36.0–46.0)
Hemoglobin: 13.3 g/dL (ref 12.0–15.0)
MCH: 31 pg (ref 26.0–34.0)
MCHC: 33.8 g/dL (ref 30.0–36.0)
MCV: 91.6 fL (ref 78.0–100.0)
PLATELETS: 211 10*3/uL (ref 150–400)
RBC: 4.29 MIL/uL (ref 3.87–5.11)
RDW: 14.3 % (ref 11.5–15.5)
WBC: 12.3 10*3/uL — ABNORMAL HIGH (ref 4.0–10.5)

## 2017-11-09 LAB — RPR: RPR Ser Ql: NONREACTIVE

## 2017-11-09 MED ORDER — ONDANSETRON HCL 4 MG/2ML IJ SOLN: 4.0000 mg | Freq: Four times a day (QID) | INTRAMUSCULAR | Status: DC | PRN

## 2017-11-09 MED ORDER — SODIUM CHLORIDE 0.9 % IV SOLN: 2.0000 g | Freq: Four times a day (QID) | INTRAVENOUS | Status: DC

## 2017-11-09 MED ORDER — PHENYLEPHRINE 40 MCG/ML (10ML) SYRINGE FOR IV PUSH (FOR BLOOD PRESSURE SUPPORT)
80.0000 ug | PREFILLED_SYRINGE | INTRAVENOUS | Status: DC | PRN
  Filled 2017-11-09: qty 10
  Filled 2017-11-09: qty 5

## 2017-11-09 MED ORDER — DIBUCAINE 1 % RE OINT: 1.0000 "application " | TOPICAL_OINTMENT | RECTAL | Status: DC | PRN

## 2017-11-09 MED ORDER — ZOLPIDEM TARTRATE 5 MG PO TABS: 5.0000 mg | ORAL_TABLET | Freq: Every evening | ORAL | Status: DC | PRN

## 2017-11-09 MED ORDER — FENTANYL 2.5 MCG/ML BUPIVACAINE 1/10 % EPIDURAL INFUSION (WH - ANES)
14.0000 mL/h | INTRAMUSCULAR | Status: DC | PRN
  Administered 2017-11-09: 14 mL/h via EPIDURAL
  Filled 2017-11-09: qty 100

## 2017-11-09 MED ORDER — ACETAMINOPHEN 325 MG PO TABS
650.0000 mg | ORAL_TABLET | ORAL | Status: DC | PRN
  Administered 2017-11-09: 650 mg via ORAL
  Filled 2017-11-09: qty 2

## 2017-11-09 MED ORDER — LIDOCAINE HCL (PF) 1 % IJ SOLN
INTRAMUSCULAR | Status: DC | PRN
  Administered 2017-11-09: 13 mL via EPIDURAL

## 2017-11-09 MED ORDER — TERBUTALINE SULFATE 1 MG/ML IJ SOLN
0.2500 mg | Freq: Once | INTRAMUSCULAR | Status: DC | PRN
  Filled 2017-11-09: qty 1

## 2017-11-09 MED ORDER — SIMETHICONE 80 MG PO CHEW: 80.0000 mg | CHEWABLE_TABLET | ORAL | Status: DC | PRN

## 2017-11-09 MED ORDER — LACTATED RINGERS IV SOLN: 500.0000 mL | INTRAVENOUS | Status: DC | PRN

## 2017-11-09 MED ORDER — LIDOCAINE HCL (PF) 1 % IJ SOLN
30.0000 mL | INTRAMUSCULAR | Status: DC | PRN
  Filled 2017-11-09: qty 30

## 2017-11-09 MED ORDER — IBUPROFEN 600 MG PO TABS
600.0000 mg | ORAL_TABLET | Freq: Four times a day (QID) | ORAL | Status: DC
  Administered 2017-11-09 – 2017-11-11 (×8): 600 mg via ORAL
  Filled 2017-11-09 (×8): qty 1

## 2017-11-09 MED ORDER — PRENATAL MULTIVITAMIN CH
1.0000 | ORAL_TABLET | Freq: Every day | ORAL | Status: DC
  Administered 2017-11-09 – 2017-11-10 (×2): 1 via ORAL
  Filled 2017-11-09 (×2): qty 1

## 2017-11-09 MED ORDER — FENTANYL CITRATE (PF) 100 MCG/2ML IJ SOLN: 50.0000 ug | INTRAMUSCULAR | Status: DC | PRN

## 2017-11-09 MED ORDER — DIPHENHYDRAMINE HCL 25 MG PO CAPS: 25.0000 mg | ORAL_CAPSULE | Freq: Four times a day (QID) | ORAL | Status: DC | PRN

## 2017-11-09 MED ORDER — OXYCODONE-ACETAMINOPHEN 5-325 MG PO TABS: 1.0000 | ORAL_TABLET | ORAL | Status: DC | PRN

## 2017-11-09 MED ORDER — ONDANSETRON HCL 4 MG PO TABS: 4.0000 mg | ORAL_TABLET | ORAL | Status: DC | PRN

## 2017-11-09 MED ORDER — WITCH HAZEL-GLYCERIN EX PADS: 1.0000 "application " | MEDICATED_PAD | CUTANEOUS | Status: DC | PRN

## 2017-11-09 MED ORDER — EPHEDRINE 5 MG/ML INJ
10.0000 mg | INTRAVENOUS | Status: DC | PRN
  Filled 2017-11-09: qty 2

## 2017-11-09 MED ORDER — DIPHENHYDRAMINE HCL 50 MG/ML IJ SOLN: 12.5000 mg | INTRAMUSCULAR | Status: DC | PRN

## 2017-11-09 MED ORDER — SODIUM CHLORIDE 0.9 % IV SOLN
2.0000 g | INTRAVENOUS | Status: DC
  Administered 2017-11-09: 2 g via INTRAVENOUS
  Filled 2017-11-09: qty 2
  Filled 2017-11-09 (×2): qty 2000

## 2017-11-09 MED ORDER — SOD CITRATE-CITRIC ACID 500-334 MG/5ML PO SOLN
30.0000 mL | ORAL | Status: DC | PRN
  Administered 2017-11-09: 30 mL via ORAL
  Filled 2017-11-09: qty 15

## 2017-11-09 MED ORDER — OXYTOCIN 40 UNITS IN LACTATED RINGERS INFUSION - SIMPLE MED
2.5000 [IU]/h | INTRAVENOUS | Status: DC
  Administered 2017-11-09: 2.5 [IU]/h via INTRAVENOUS
  Filled 2017-11-09: qty 1000

## 2017-11-09 MED ORDER — PHENYLEPHRINE 40 MCG/ML (10ML) SYRINGE FOR IV PUSH (FOR BLOOD PRESSURE SUPPORT)
80.0000 ug | PREFILLED_SYRINGE | INTRAVENOUS | Status: DC | PRN
  Filled 2017-11-09: qty 5

## 2017-11-09 MED ORDER — LACTATED RINGERS IV SOLN
INTRAVENOUS | Status: DC
  Administered 2017-11-09 (×2): via INTRAVENOUS

## 2017-11-09 MED ORDER — LACTATED RINGERS IV SOLN
500.0000 mL | Freq: Once | INTRAVENOUS | Status: AC
  Administered 2017-11-09: 500 mL via INTRAVENOUS

## 2017-11-09 MED ORDER — BENZOCAINE-MENTHOL 20-0.5 % EX AERO
1.0000 "application " | INHALATION_SPRAY | CUTANEOUS | Status: DC | PRN
  Administered 2017-11-09: 1 via TOPICAL
  Filled 2017-11-09: qty 56

## 2017-11-09 MED ORDER — TETANUS-DIPHTH-ACELL PERTUSSIS 5-2.5-18.5 LF-MCG/0.5 IM SUSP: 0.5000 mL | Freq: Once | INTRAMUSCULAR | Status: DC

## 2017-11-09 MED ORDER — OXYCODONE-ACETAMINOPHEN 5-325 MG PO TABS: 2.0000 | ORAL_TABLET | ORAL | Status: DC | PRN

## 2017-11-09 MED ORDER — ONDANSETRON HCL 4 MG/2ML IJ SOLN: 4.0000 mg | INTRAMUSCULAR | Status: DC | PRN

## 2017-11-09 MED ORDER — OXYTOCIN BOLUS FROM INFUSION
500.0000 mL | Freq: Once | INTRAVENOUS | Status: AC
  Administered 2017-11-09: 500 mL via INTRAVENOUS

## 2017-11-09 MED ORDER — SODIUM CHLORIDE 0.9 % IV SOLN
2.0000 g | Freq: Once | INTRAVENOUS | Status: DC
  Filled 2017-11-09: qty 2000

## 2017-11-09 MED ORDER — COCONUT OIL OIL
1.0000 "application " | TOPICAL_OIL | Status: DC | PRN
  Administered 2017-11-11: 1 via TOPICAL
  Filled 2017-11-09: qty 120

## 2017-11-09 MED ORDER — ACETAMINOPHEN 325 MG PO TABS: 650.0000 mg | ORAL_TABLET | ORAL | Status: DC | PRN

## 2017-11-09 MED ORDER — SENNOSIDES-DOCUSATE SODIUM 8.6-50 MG PO TABS
2.0000 | ORAL_TABLET | ORAL | Status: DC
  Administered 2017-11-09 – 2017-11-10 (×2): 2 via ORAL
  Filled 2017-11-09 (×2): qty 2

## 2017-11-09 NOTE — Progress Notes (Signed)
[employer] video interpreter number [phone] used to obtain informed consent for epidural procedure. Pt denies any questions or concerns about epidural.

## 2017-11-09 NOTE — Anesthesia Preprocedure Evaluation (Signed)
Anesthesia Evaluation  Patient identified by MRN, date of birth, ID band Patient awake    Reviewed: Allergy & Precautions, H&P , NPO status , Patient's Chart, lab work & pertinent test results  Airway Mallampati: II  TM Distance: >3 FB Neck ROM: full    Dental no notable dental hx.    Pulmonary neg pulmonary ROS,    Pulmonary exam normal breath sounds clear to auscultation       Cardiovascular negative cardio ROS Normal cardiovascular exam Rhythm:regular Rate:Normal     Neuro/Psych negative neurological ROS  negative psych ROS   GI/Hepatic negative GI ROS, Neg liver ROS,   Endo/Other  negative endocrine ROS  Renal/GU negative Renal ROS  negative genitourinary   Musculoskeletal   Abdominal   Peds  Hematology negative hematology ROS (+)   Anesthesia Other Findings Pregnancy - previous C section for previa  Platelets and allergies reviewed Denies active cardiac or pulmonary symptoms, METS > 4  Denies blood thinning medications, bleeding disorders, hypertension, asthma, supine hypotension syndrome, previous anesthesia difficulties   Reproductive/Obstetrics (+) Pregnancy                             Anesthesia Physical  Anesthesia Plan  ASA: III  Anesthesia Plan: Epidural   Post-op Pain Management:    Induction:   PONV Risk Score and Plan:   Airway Management Planned:   Additional Equipment:   Intra-op Plan:   Post-operative Plan:   Informed Consent: I have reviewed the patients History and Physical, chart, labs and discussed the procedure including the risks, benefits and alternatives for the proposed anesthesia with the patient or authorized representative who has indicated his/her understanding and acceptance.     Plan Discussed with:   Anesthesia Plan Comments:         Anesthesia Quick Evaluation

## 2017-11-09 NOTE — Anesthesia Procedure Notes (Signed)
Epidural Patient location during procedure: OB Start time: 11/09/2017 5:10 AM End time: 11/09/2017 5:23 AM  Staffing Anesthesiologist: Lowella CurbMiller, Francheska Villeda Ray, MD Performed: anesthesiologist   Preanesthetic Checklist Completed: patient identified, site marked, surgical consent, pre-op evaluation, timeout performed, IV checked, risks and benefits discussed and monitors and equipment checked  Epidural Patient position: sitting Prep: ChloraPrep Patient monitoring: heart rate, cardiac monitor, continuous pulse ox and blood pressure Approach: midline Location: L2-L3 Injection technique: LOR saline  Needle:  Needle type: Tuohy  Needle gauge: 17 G Needle length: 9 cm Needle insertion depth: 5 cm Catheter type: closed end flexible Catheter size: 20 Guage Catheter at skin depth: 9 cm Test dose: negative  Assessment Events: blood not aspirated, injection not painful, no injection resistance, negative IV test and no paresthesia  Additional Notes Reason for block:procedure for pain

## 2017-11-09 NOTE — Anesthesia Postprocedure Evaluation (Signed)
Anesthesia Post Note  Patient: Ernestine Conradlizabeth Vazquez Gonzalez  Procedure(s) Performed: AN AD HOC LABOR EPIDURAL     Patient location during evaluation: Mother Baby Anesthesia Type: Epidural Level of consciousness: awake and alert and oriented Pain management: satisfactory to patient Vital Signs Assessment: post-procedure vital signs reviewed and stable Respiratory status: respiratory function stable Cardiovascular status: stable Postop Assessment: no headache, no backache, epidural receding, patient able to bend at knees, no signs of nausea or vomiting and adequate PO intake Anesthetic complications: no    Last Vitals:  Vitals:   11/09/17 1241 11/09/17 1400  BP: (!) 130/55 119/69  Pulse: 67 63  Resp: 18 16  Temp: 36.7 C 36.8 C    Last Pain:  Vitals:   11/09/17 1400  TempSrc: Oral  PainSc: 0-No pain   Pain Goal:                 Lalana Wachter

## 2017-11-09 NOTE — Plan of Care (Signed)
  Problem: Education: Goal: Knowledge of condition will improve Note:  Admission education, safety and unit protocols reviewed with patient and significant other. Patient declines need for interpreter for this education. Encouraged patient to inform staff if she needs interpreter for anything. Significant other also declined need for interpreter. Patient responded to education and questions appropriately in [place]. Earl Galasborne, Linda HedgesStefanie ChililiHudspeth

## 2017-11-09 NOTE — Anesthesia Pain Management Evaluation Note (Signed)
  [facility] Pain Management Visit Note  Patient: Lacey Sanders, 32 y.o., female  "Hello [facility]. We have an anesthesia team available at all times to provide care throughout the hospital, including epidural management and anesthesia for C-section. I don't know your plan for the delivery whether it a natural birth, water birth, IV sedation, nitrous supplementation, doula or epidural, but we want to meet your pain goals."   1.Was your pain managed to your expectations on prior hospitalizations?   Did not ask  2.What is your expectation for pain management during this hospitalization?     Epidural  3.How can we help you reach that goal? Epidural in place and working well  Record the patient's initial score and the patient's pain goal.   Pain: 0  Pain Goal: 5 [facility] wants you to be able to say your pain was always managed very well.  Lacey Sanders 11/09/2017

## 2017-11-09 NOTE — H&P (Addendum)
OBSTETRIC ADMISSION HISTORY AND PHYSICAL  Lacey Sanders is a 32 y.o. female [phone] with IUP at 544w4d by LMP presenting for SOL. She reports +FMs, No LOF, no VB, no blurry vision, headaches or peripheral edema, and RUQ pain.  She plans on breast feeding. She request OCPs for birth control. [facility]   Dating: By LMP --->  Estimated Date of Delivery: 11/05/17  Sono:  @[redacted]w[redacted]d , CWD, normal anatomy, cephalic presentation, 1194 g, 75% EFW   Prenatal History/Complications: none  Past Medical History: Past Medical History:  Diagnosis Date  . Medical history non-contributory     Past Surgical History: Past Surgical History:  Procedure Laterality Date  . CESAREAN SECTION      Obstetrical History: OB History    Gravida  3   Para  3   Term  3   Preterm  0   AB  0   Living  3     SAB  0   TAB  0   Ectopic  0   Multiple  0   Live Births  3           Social History: Social History   Socioeconomic History  . Marital status: Single    Spouse name: Not on file  . Number of children: Not on file  . Years of education: Not on file  . Highest education level: Not on file  Occupational History  . Not on file  Social Needs  . Financial resource strain: Not hard at all  . Food insecurity:    Worry: Never true    Inability: Never true  . Transportation needs:    Medical: No    Non-medical: Not on file  Tobacco Use  . Smoking status: Never Smoker  . Smokeless tobacco: Never Used  Substance and Sexual Activity  . Alcohol use: No  . Drug use: No  . Sexual activity: Yes  Lifestyle  . Physical activity:    Days per week: 0 days    Minutes per session: 0 min  . Stress: Only a little  Relationships  . Social connections:    Talks on phone: Not on file    Gets together: Not on file    Attends religious service: Not on file    Active member of club or organization: Not on file    Attends meetings of clubs or  organizations: Not on file    Relationship status: Not on file  Other Topics Concern  . Not on file  Social History Narrative  . Not on file    Family History: Family History  Problem Relation Age of Onset  . Hyperlipidemia Mother   . Asthma Father     Allergies: No Known Allergies  Medications Prior to Admission  Medication Sig Dispense Refill Last Dose  . Prenatal Vit-Fe Fumarate-FA (PRENATAL MULTIVITAMIN) TABS tablet Take 1 tablet by mouth daily at 12 noon.   11/08/2017 at Unknown time     Review of Systems   All systems reviewed and negative except as stated in HPI  Blood pressure 119/69, pulse 63, temperature 98.3 F (36.8 C), temperature source Oral, resp. rate 16, height 5\' 1"  (1.549 m), weight 92.1 kg, last menstrual period 01/29/2017, unknown if currently breastfeeding. General appearance: alert, cooperative, appears stated age and moderate distress Lungs: clear to auscultation bilaterally Heart: regular rate and rhythm Abdomen: soft, non-tender; bowel sounds normal Pelvic: see below Extremities: Homans sign is negative, no sign of DVT  Dilation: 10 Effacement (%): 100 Station: Plus 1 Exam by:: Raliegh Ip RN   Prenatal labs: ABO, Rh: --/--/O POS (09/23 1610) Antibody: NEG (09/23 0346) Rubella: 8.08 (03/15 1046) RPR: Non Reactive (09/23 9604)  HBsAg: Negative (03/15 1046)  HIV: Non Reactive (07/03 0910)  GBS:    2 hr GTT: normal Genetic screening  declined Anatomy US normal  Prenatal Transfer Tool  Maternal Diabetes: No Genetic Screening: Declined Maternal Ultrasounds/Referrals: Normal Fetal Ultrasounds or other Referrals:  None Maternal Substance Abuse:  No Significant Maternal Medications:  None Significant Maternal Lab Results: Lab values include: Group B Strep positive  Results for orders placed or performed during the hospital encounter of 11/09/17 (from the past 24 hour(s))  CBC   Collection Time: 11/09/17  3:46 AM  Result Value Ref  Range   WBC 12.3 (H) 4.0 - 10.5 K/uL   RBC 4.29 3.87 - 5.11 MIL/uL   Hemoglobin 13.3 12.0 - 15.0 g/dL   HCT 54.0 98.1 - 19.1 %   MCV 91.6 78.0 - 100.0 fL   MCH 31.0 26.0 - 34.0 pg   MCHC 33.8 30.0 - 36.0 g/dL   RDW 47.8 29.5 - 62.1 %   Platelets 211 150 - 400 K/uL  Type and screen   Collection Time: 11/09/17  3:46 AM  Result Value Ref Range   ABO/RH(D) O POS    Antibody Screen NEG    Sample Expiration      11/12/2017 Performed at [facility], [address] [place], [place] [address]   RPR   Collection Time: 11/09/17  6:33 AM  Result Value Ref Range   RPR Ser Ql Non Reactive Non Reactive    Patient Active Problem List   Diagnosis Date Noted  . Post-dates pregnancy 11/09/2017  . Group B streptococcal infection during pregnancy 10/28/2017  . Abnormal ultrasonic finding on antenatal screening of mother, antepartum 06/16/2017  . History of VBAC 05/01/2017  . Previous cesarean section complicating pregnancy 05/01/2017  . History of placenta previa 05/01/2017  . Supervision of low-risk pregnancy 05/01/2017  . GERD (gastroesophageal reflux disease) 09/23/2012  . IBS (irritable bowel syndrome) 09/03/2012    Assessment/Plan:  Lacey Sanders is a 32 y.o. G3P3003 at [redacted]w[redacted]d here for SOL.  #Labor: manage expectantly #Pain: IV medication  #FWB: Category I #ID:  GBS positive #MOF: Breast #MOC:OCPs #Circ:  no  Mirian Mo, MD  11/09/2017, 2:29 PM  I confirm that I have verified the information documented in the resident's note and that I have also personally reperformed the physical exam and all medical decision making activities. Aviva Signs, CNM

## 2017-11-09 NOTE — H&P (Signed)
OBSTETRIC ADMISSION HISTORY AND PHYSICAL  Lacey Sanders is a 32 y.o. female [phone] with IUP at [redacted]w[redacted]d by L/19 presenting for spontaneous onset of labor. Feeling contractions every 2-3 minutes.   Reports fetal movement. Denies vaginal bleeding or leakage of fluids.   She received her prenatal care at [facility].  Support person in labor: partner, mother   Ultrasounds . 19w4: normal detailed anatomy U/S with limited arch views, UTD A1 on left, 2 small CP cysts without signs of T18  . 23w1: repeat anatomy U/S, anterior placenta, resolved UTD, female, echogenic intracardiac focus and bilateral chorioid plexus cysts still present   Prenatal History/Complications: . TOLAC with history of successful VBAC . GBS+  . Resolved UTD . IBS, GERD  . History of placenta previa in prior pregnancy   Past Medical History: Past Medical History:  Diagnosis Date  . Medical history non-contributory     Past Surgical History: Past Surgical History:  Procedure Laterality Date  . CESAREAN SECTION      Obstetrical History: OB History    Gravida  3   Para  2   Term  2   Preterm  0   AB  0   Living  2     SAB  0   TAB  0   Ectopic  0   Multiple  0   Live Births  2           Social History: Social History   Socioeconomic History  . Marital status: Single    Spouse name: Not on file  . Number of children: Not on file  . Years of education: Not on file  . Highest education level: Not on file  Occupational History  . Not on file  Social Needs  . Financial resource strain: Not hard at all  . Food insecurity:    Worry: Never true    Inability: Never true  . Transportation needs:    Medical: No    Non-medical: Not on file  Tobacco Use  . Smoking status: Never Smoker  . Smokeless tobacco: Never Used  Substance and Sexual Activity  . Alcohol use: No  . Drug use: No  . Sexual activity: Yes  Lifestyle  . Physical activity:    Days per week: 0 days    Minutes per  session: 0 min  . Stress: Only a little  Relationships  . Social connections:    Talks on phone: Not on file    Gets together: Not on file    Attends religious service: Not on file    Active member of club or organization: Not on file    Attends meetings of clubs or organizations: Not on file    Relationship status: Not on file  Other Topics Concern  . Not on file  Social History Narrative  . Not on file    Family History: Family History  Problem Relation Age of Onset  . Hyperlipidemia Mother   . Asthma Father     Allergies: No Known Allergies  Medications Prior to Admission  Medication Sig Dispense Refill Last Dose  . Prenatal Vit-Fe Fumarate-FA (PRENATAL MULTIVITAMIN) TABS tablet Take 1 tablet by mouth daily at 12 noon.   11/08/2017 at Unknown time     Review of Systems  All systems reviewed and negative except as stated in HPI  Blood pressure 132/85, pulse 72, temperature 98.4 F (36.9 C), temperature source Oral, resp. rate 16, height 5\' 1"  (1.549 m), weight 92.5 kg, last  menstrual period 01/29/2017, currently breastfeeding. General appearance: appears uncomfortable during contractions  Lungs: no respiratory distress Heart: regular rate  Abdomen: soft, non-tender; gravid Pelvic: deferred Extremities: Homans sign is negative, no sign of DVT Presentation: cephalic by Leopold's Fetal monitoring: Category I, Reactive CST  130s/mod/+a/-d Uterine activity: every 3-5 minutes Dilation: 5.5 Effacement (%): 80 Station: -2, -3 Exam by:: K. WeissRN  Prenatal labs: ABO, Rh: O/Positive/-- (03/15 1046) Antibody: Negative (03/15 1046) Rubella: 8.08 (03/15 1046) RPR: Non Reactive (07/03 0910)  HBsAg: Negative (03/15 1046)  HIV: Non Reactive (07/03 0910)  GBS:   positive Glucola: normal 2-hr Genetic screening:  Normal HgB electrophoresis, negative CF test   Prenatal Transfer Tool  Maternal Diabetes: No Genetic Screening: Normal Maternal Ultrasounds/Referrals:  resolved UTD, resolving choroid plexus cysts (as of 08/07/17 U/S)  Fetal Ultrasounds or other Referrals:  None Maternal Substance Abuse:  No Significant Maternal Medications:  None Significant Maternal Lab Results: None  No results found for this or any previous visit (from the past 24 hour(s)).  Patient Active Problem List   Diagnosis Date Noted  . Group B streptococcal infection during pregnancy 10/28/2017  . Abnormal ultrasonic finding on antenatal screening of mother, antepartum 06/16/2017  . History of VBAC 05/01/2017  . Previous cesarean section complicating pregnancy 05/01/2017  . History of placenta previa 05/01/2017  . Supervision of low-risk pregnancy 05/01/2017  . GERD (gastroesophageal reflux disease) 09/23/2012  . IBS (irritable bowel syndrome) 09/03/2012    Assessment/Plan:  Lacey Sanders is a 32 y.o. G3P2002 at 2699w4d here for spontaneous onset of labor.  Labor: Expectant management, transitioning into active labor. Changed from 4.5-5.5 in MAU over 1.5-2hr period.  -- pain control: planning for epidural  Fetal Wellbeing: EFW 8lbs by Leopold's. Cephalic by prior checks .  -- GBS (+) - plan for PCN -- continuous fetal monitoring - Category I strip at this time    Postpartum Planning -- breast/OCPs -- RI/[x] Tdap   Lianni Kanaan S. Earlene PlaterWallace, DO OB/GYN Fellow

## 2017-11-09 NOTE — Progress Notes (Signed)
Patient ID: Lacey Sanders, female   DOB: 11-22-1985, 32 y.o.   MRN: [id number] Comfortable with epidural  Vitals:   11/09/17 0901 11/09/17 0931 11/09/17 0954 11/09/17 1001  BP: 122/79 108/61  (!) 107/57  Pulse: 69 75  73  Resp: 18 18 20 16   Temp:    98.2 F (36.8 C)  TempSrc:    Oral  Weight:      Height:        FHR reactive UCs every 2-3 min  Dilation: Lip/rim Effacement (%): 100 Cervical Position: Middle Station: -1 Presentation: Vertex Exam by:: Raliegh Ipatherine Stout RN  Anticipated SVD

## 2017-11-09 NOTE — Lactation Note (Signed)
This note was copied from a baby's chart. Lactation Consultation Note  Patient Name: Lacey Sanders [id number]'s Date: 11/09/2017 Reason for consult: Initial assessment;Term  P3 mother whose infant is now 425 hours old.  Mother breast fed her first child for 1 yr, 2 months and her second child for 1 yr, 3 months.  This baby is term and LGA.  Baby was sleeping in bassinet as I arrived and not showing feeding cues.  Mother did not choose to have an interpreter but asked her to let me know if she needs one at any time.  Encouraged to feed 8-12 times/24 hours or sooner if baby shows feeding cues.  Reviewed feeding cues.  Colostrum container provided for any EBM she may obtain with hand expression.  She is familiar with hand expression and did not require a review.    Suggested she awaken him in 3 hour intervals if he remained sleepy due to larger size so his blood sugar does not drop and she verbalized understanding.  She will call RN if she cannot awaken him.   Mom made aware of O/P services, breastfeeding support groups, community resources, and our phone # for post-discharge questions. . Mother will call for latch assistance as needed.   Maternal Data Formula Feeding for Exclusion: No Has patient been taught Hand Expression?: Yes Does the patient have breastfeeding experience prior to this delivery?: Yes  Feeding Feeding Type: Breast Fed Length of feed: 0 min  LATCH Score                   Interventions    Lactation Tools Discussed/Used     Consult Status Consult Status: Follow-up Date: 11/10/17 Follow-up type: In-patient    Dora SimsBeth R Divit Stipp 11/09/2017, 4:47 PM

## 2017-11-10 ENCOUNTER — Encounter (HOSPITAL_COMMUNITY): Payer: Self-pay | Admitting: Advanced Practice Midwife

## 2017-11-10 NOTE — Lactation Note (Addendum)
This note was copied from a baby's chart. Lactation Consultation Note  Patient Name: Lacey Sanders Today's Date: 11/10/2017  Full term LGA infant.   Mom reports he is sleepy.  Falls asleep at the breast easily.  Infant cuing.  Offered to assist with feeding.  Mom agreed.  Mom did not want interpreter at this time. Mom sat in chair .  Put pillows on left side and brought him to her on left breast in football/clutch position.  Infant opens and latches and takes a few sucks and falls asleep.  No audible swallows heard.  Assist mom in waking infant and doing some hand expression of small drops of colostrum via spoon.  Infant roused and latched well on right breast in cradle hold. Mom reports comfort. Mom is and experienced bf mom who breast fed both of her previous infants until past 1 year, Rhythmic sucking and audible swallows heard.  Urged parents to hand express and spoon feed back all ebm past bf until he is bf well.  Left mom and infant bf.   Infant has had 7 stools and 1 void and 25 hours old.   Maternal Data    Feeding Feeding Type: Breast Fed  LATCH Score Latch: Grasps breast easily, tongue down, lips flanged, rhythmical sucking.  Audible Swallowing: A few with stimulation  Type of Nipple: Everted at rest and after stimulation  Comfort (Breast/Nipple): Soft / non-tender  Hold (Positioning): No assistance needed to correctly position infant at breast.  [facility]: 9  Interventions    Lactation Tools Discussed/Used     Consult Status      Lacey Sanders 11/10/2017, 12:45 PM

## 2017-11-10 NOTE — Progress Notes (Signed)
Post Partum Day #1 Subjective: no complaints, up ad lib and tolerating PO; breastfeeding going well; plans POPs for contraception  Objective: Blood pressure 115/68, pulse 66, temperature 98 F (36.7 C), temperature source Oral, resp. rate 17, height 5\' 1"  (1.549 m), weight 92.1 kg, last menstrual period 01/29/2017, unknown if currently breastfeeding.  Physical Exam:  General: alert, cooperative and no distress Lochia: appropriate Uterine Fundus: firm DVT Evaluation: No evidence of DVT seen on physical exam.  Recent Labs    11/09/17 0346  HGB 13.3  HCT 39.3    Assessment/Plan: Plan for discharge tomorrow- only rec'd single dose of Amp for GBS ppx prior to delivering   LOS: 1 day   Cam HaiSHAW, Lacey Sanders CNM 11/10/2017, 11:55 AM

## 2017-11-11 MED ORDER — ACETAMINOPHEN 325 MG PO TABS: 650.0000 mg | ORAL_TABLET | ORAL | 0 refills | Status: DC | PRN

## 2017-11-11 MED ORDER — IBUPROFEN 600 MG PO TABS: 600.0000 mg | ORAL_TABLET | Freq: Four times a day (QID) | ORAL | 0 refills | Status: DC

## 2017-11-11 NOTE — Discharge Instructions (Signed)
Parto vaginal, cuidados posteriores  Vaginal Delivery, Care After  Siga estas instrucciones durante las prximas semanas. Estas indicaciones le proporcionan informacin acerca de cmo deber cuidarse despus del parto vaginal. Su mdico tambin podr darle indicaciones ms especficas. El tratamiento ha sido planificado segn las prcticas mdicas actuales, pero en algunos casos pueden ocurrir problemas. Llame al mdico si tiene problemas o preguntas.  Qu puedo esperar despus del procedimiento?  Despus de un parto vaginal, es frecuente tener lo siguiente:   Hemorragia leve de la vagina.   Dolor en el abdomen, la vagina y la zona de la piel entre la abertura vaginal y el ano (perineo).   Calambres plvicos.   Fatiga.    Siga estas indicaciones en su casa:  Medicamentos   Tome los medicamentos de venta libre y los recetados solamente como se lo haya indicado el mdico.   Si le recetaron un antibitico, tmelo como se lo haya indicado el mdico. No interrumpa la administracin del antibitico hasta que lo haya terminado.  Conducir     No conduzca ni opere maquinaria pesada mientras toma analgsicos recetados.   No conduzca durante 24horas si le administraron un sedante.  Estilo de vida   No beba alcohol. Esto es de suma importancia si est amamantando o toma analgsicos.   No consuma productos que contengan tabaco, incluidos cigarrillos, tabaco de mascar o cigarrillos electrnicos. Si necesita ayuda para dejar de fumar, consulte al mdico.  Qu debe comer y beber   Beba al menos 8vasos de ochoonzas (240cc) de agua todos los das a menos que el mdico le indique lo contrario. Si elige amamantar al beb, quiz deba beber an ms cantidad de agua.   Coma alimentos ricos en fibras todos los das. Estos alimentos pueden ayudarla a prevenir o aliviar el estreimiento. Los alimentos ricos en fibras incluyen, entre otros:  ? Panes y cereales integrales.  ? Arroz integral.  ? Frijoles.  ? Frutas y verduras  frescas.  Actividad   Retome sus actividades normales como se lo haya indicado el mdico. Pregntele al mdico qu actividades son seguras para usted.   Descanse todo lo que pueda. Trate de descansar o tomar una siesta mientras el beb est durmiendo.   No levante objetos que pesen ms que su beb o 10libras (4,5kg) hasta que el mdico le diga que es seguro.   Hable con el mdico sobre cundo puede retomar la actividad sexual. Esto puede depender de lo siguiente:  ? Riesgo de sufrir una infeccin.  ? Velocidad de cicatrizacin.  ? Comodidad y deseo de retomar la actividad sexual.  Cuidados vaginales   Si le realizaron una episiotoma o tuvo un desgarro vaginal, contrlese la zona todos los das para detectar signos de infeccin. Est atenta a los siguientes signos:  ? Aumento del enrojecimiento, la hinchazn o el dolor.  ? Mayor presencia de lquido o sangre.  ? Calor.  ? Pus o mal olor.   No use tampones ni se haga duchas vaginales hasta que el mdico la autorice.   Controle la sangre que elimina por la vagina para detectar cogulos de sangre. Estos pueden tener el aspecto de grumos de color rojo oscuro, o secrecin marrn o negra.  Instrucciones generales   Mantenga el perineo limpio y seco, como se lo haya indicado el mdico.   Use ropa cmoda y suelta.   Cuando vaya al bao, siempre higiencese de adelante hacia atrs.   Pregntele al mdico si puede ducharse o tomar baos de inmersin.   Si se le realiz una episiotoma o tuvo un desgarro perineal durante el trabajo del parto o el parto, es posible que el mdico le indique que no tome baos de inmersin durante un determinado tiempo.   Use un sostn que sujete y ajuste bien sus pechos.   Si es posible, pdale a alguien que la ayude con las tareas del hogar y a cuidar del beb durante al menos algunos das despus de que le den el alta del hospital.   Concurra a todas las visitas de seguimiento para usted y el beb, como se lo haya indicado el  mdico. Esto es importante.  Comunquese con un mdico si:   Tiene los siguientes sntomas:  ? Secrecin vaginal que tiene mal olor.  ? Dificultad para orinar.  ? Dolor al orinar.  ? Aumento o disminucin repentinos de la frecuencia de las deposiciones.  ? Ms enrojecimiento, hinchazn o dolor alrededor de la episiotoma o del desgarro vaginal.  ? Ms secrecin de lquido o sangre de la episiotoma o del desgarro vaginal.  ? Pus o mal olor proveniente de la episiotoma o del desgarro vaginal.  ? Fiebre.  ? Erupcin cutnea.  ? Poco inters o falta de inters en actividades que solan gustarle.  ? Dudas sobre su cuidado y el del beb.   Siente la episiotoma o el desgarro vaginal caliente al tacto.   La episiotoma o el desgarro vaginal se abren o no parecen cicatrizar.   Siente dolor en las mamas, o estn duras o enrojecidas.   Siente tristeza o preocupacin de forma inusual.   Siente nuseas o vomita.   Elimina cogulos de sangre grandes por la vagina. Si expulsa un cogulo de sangre por la vagina, gurdelo para mostrrselo a su mdico. No tire la cadena sin que el mdico examine el cogulo de sangre antes.   Orina ms de lo habitual.   Se siente mareada o se desmaya.   No ha amamantado para nada y no ha tenido un perodo menstrual durante 12 semanas despus del parto.   Dej de amamantar al beb y no ha tenido su perodo menstrual durante 12 semanas despus de dejar de amamantar.  Solicite ayuda de inmediato si:   Tiene los siguientes sntomas:  ? Dolor que no desaparece o no mejora con medicamentos.  ? Dolor en el pecho.  ? Dificultad para respirar.  ? Visin borrosa o manchas en la vista.  ? Pensamientos de autolesionarse o lesionar al beb.   Comienza a sentir dolor en el abdomen o en una de las piernas.   Presenta un dolor de cabeza intenso.   Se desmaya.   Tiene una hemorragia de la vagina tan intensa que empapa dos toallitas sanitarias en una hora.  Esta informacin no tiene como fin  reemplazar el consejo del mdico. Asegrese de hacerle al mdico cualquier pregunta que tenga.  Document Released: 02/03/2005 Document Revised: 05/28/2016 Document Reviewed: 02/18/2015  Elsevier Interactive Patient Education  2018 [employer]

## 2017-11-11 NOTE — Lactation Note (Signed)
This note was copied from a baby's chart. Lactation Consultation Note  Patient Name: Boy Gennett Garcia [id number] Date: 11/11/2017 Reason for consult: Follow-up assessment  Visited with P3 Mom of 47 hr old term baby at 6% weight loss.  Baby had been sleepy last night. Baby unwrapped and undressed, baby had a large transitional stool and large void. Baby latched with assistance to use cross cradle rather than cradle.  Baby latched deeply, and swallowing identified.   Encouraged keeping baby STS, and feeding baby on cue at least 8-12 feedings per 24 hrs.   Mom asked for coconut oil as she became sore with her last 2 babies when her milk came to volume.  Talked about engorgement prevention and treatment.  Hand pump given with instructions on use.  Mom has 24 and 27 mm flanges for this.   Mom aware of OP lactation support available, encouraged to call prn.    Interventions Interventions: Breast feeding basics reviewed;Assisted with latch;Skin to skin;Breast massage;Hand express;Breast compression;Adjust position;Support pillows;Position options;Hand pump;Coconut oil  Lactation Tools Discussed/Used Tools: Pump;Coconut oil Breast pump type: Manual   Consult Status Consult Status: Complete Date: 11/11/17 Follow-up type: Call as needed    Judee Clara 11/11/2017, 10:53 AM

## 2017-11-11 NOTE — Discharge Summary (Signed)
OB Discharge Summary     Patient Name: Lacey Sanders DOB: January 27, 1986 MRN: [id number]  Date of admission: 11/09/2017 Delivering MD: Aviva Signs   Date of discharge: 11/11/2017  Admitting diagnosis: 40 WKS CTX EVERY 5 MIN Intrauterine pregnancy: [redacted]w[redacted]d     Secondary diagnosis:  Active Problems:   History of VBAC   Previous cesarean section complicating pregnancy   Abnormal ultrasonic finding on antenatal screening of mother, antepartum   Group B streptococcal infection during pregnancy   Post-dates pregnancy  Additional problems: precipitous delivery     Discharge diagnosis: Term Pregnancy Delivered                                                                                                Post partum procedures:none  Augmentation: none  Complications: None  Hospital course:  Onset of Labor With Vaginal Delivery     32 y.o. yo [id number] at [redacted]w[redacted]d was admitted in Active Labor on 11/09/2017. Patient had an uncomplicated labor course as follows:  Membrane Rupture Time/Date: 9:05 AM ,11/09/2017   Intrapartum Procedures: Episiotomy: None [1]                                         Lacerations:  2nd degree [3];Perineal [11]  Patient had a delivery of a Viable infant. 11/09/2017  Information for the patient's newborn:  Torin, Whisner [[id number]]  Delivery Method: Vag-Spont    Pateint had an uncomplicated postpartum course.  She is ambulating, tolerating a regular diet, passing flatus, and urinating well. Patient is discharged home in stable condition on 11/11/17.   Physical exam  Vitals:   11/10/17 0531 11/10/17 1417 11/10/17 2243 11/11/17 0529  BP: 115/68 115/69 119/72 124/70  Pulse: 66 61 72 62  Resp: 17 19 18 16   Temp: 98 F (36.7 C) 98.2 F (36.8 C) 98.2 F (36.8 C) 97.6 F (36.4 C)  TempSrc: Oral Oral Oral Oral  SpO2:   97%   Weight:      Height:       General: alert, cooperative and no distress Lochia: appropriate Uterine  Fundus: firm Incision: N/A DVT Evaluation: No evidence of DVT seen on physical exam. Labs: Lab Results  Component Value Date   WBC 12.3 (H) 11/09/2017   HGB 13.3 11/09/2017   HCT 39.3 11/09/2017   MCV 91.6 11/09/2017   PLT 211 11/09/2017   CMP Latest Ref Rng & Units 09/25/2015  Glucose 65 - 104 mg/dL 83  BUN 6 - 23 mg/dL -  Creatinine 1.30 - 8.65 mg/dL -  Sodium 784 - 696 mEq/L -  Potassium 3.5 - 5.3 mEq/L -  Chloride 96 - 112 mEq/L -  CO2 19 - 32 mEq/L -  Calcium 8.4 - 10.5 mg/dL -  Total Protein 6.0 - 8.3 g/dL -  Total Bilirubin 0.2 - 1.2 mg/dL -  Alkaline Phos 39 - 295 U/L -  AST 0 - 37 U/L -  ALT 0 - 35 U/L -  Discharge instruction: per After Visit Summary and "Baby and Me Booklet".  After visit meds:  Allergies as of 11/11/2017   No Known Allergies     Medication List    TAKE these medications   acetaminophen 325 MG tablet Commonly known as:  TYLENOL Take 2 tablets (650 mg total) by mouth every 4 (four) hours as needed (for pain scale < 4).   ibuprofen 600 MG tablet Commonly known as:  ADVIL,MOTRIN Take 1 tablet (600 mg total) by mouth every 6 (six) hours.   prenatal multivitamin Tabs tablet Take 1 tablet by mouth daily at 12 noon.       Diet: routine diet  Activity: Advance as tolerated. Pelvic rest for 6 weeks.   Outpatient follow up:6 weeks Follow up Appt: Future Appointments  Date Time Provider Department Center  12/16/2017  2:15 PM Tamera Stands, DO [facility]   Follow up Visit:No follow-ups on file.  Postpartum contraception: Combination OCPs  Newborn Data: Live born female  Birth Weight: 10 lb (4535 g) APGAR: 8, 9  Newborn Delivery   Birth date/time:  11/09/2017 10:57:00 Delivery type:  VBAC, Spontaneous     Baby Feeding: Breast Disposition:home with mother   11/11/2017 Denzil Hughes, MD

## 2017-11-12 ENCOUNTER — Ambulatory Visit (HOSPITAL_COMMUNITY): Admission: RE | Admit: 2017-11-12 | Payer: Medicaid Other | Source: Ambulatory Visit

## 2017-12-16 ENCOUNTER — Ambulatory Visit: Payer: Medicaid Other | Admitting: Family Medicine

## 2018-03-30 IMAGING — US US MFM OB COMP +14 WKS
1 series · 14 of 28 positions shown · non-contrast
Comparison: none

[Series 1: us mfm ob comp +14 wks · 57 acquisitions, 14 frames shown]
[im 3/57]
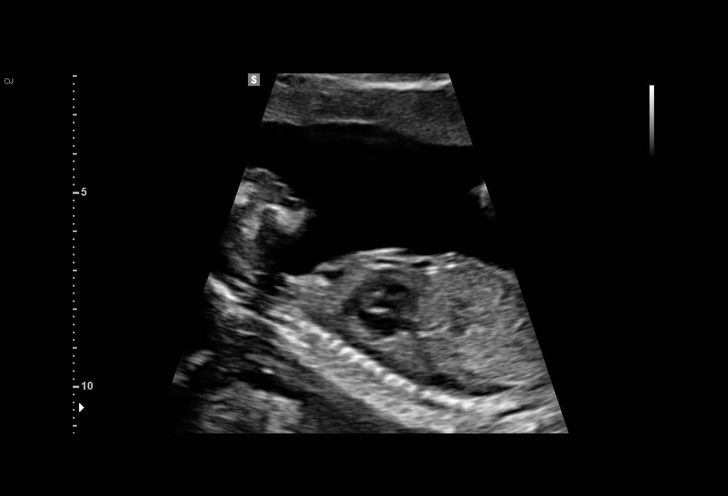
[im 7/57]
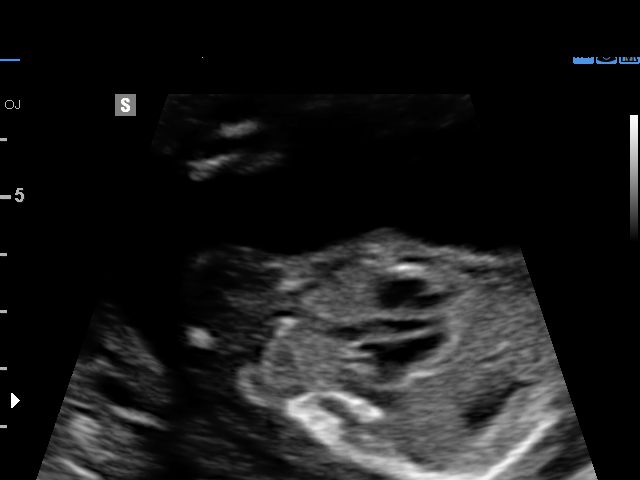
[im 11/57]
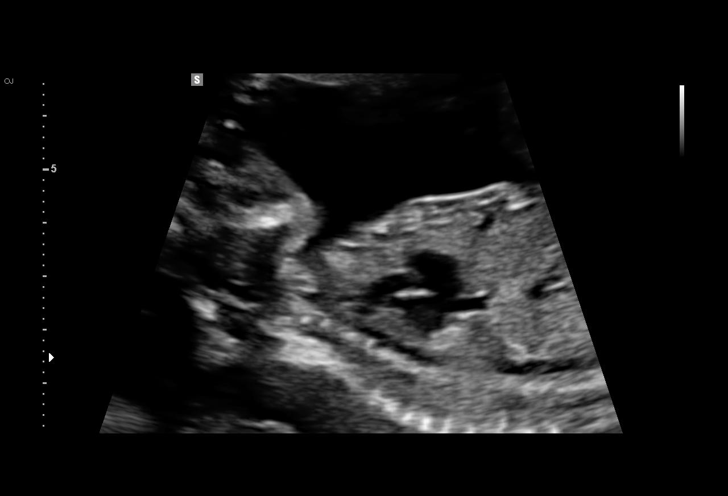
[im 15/57]
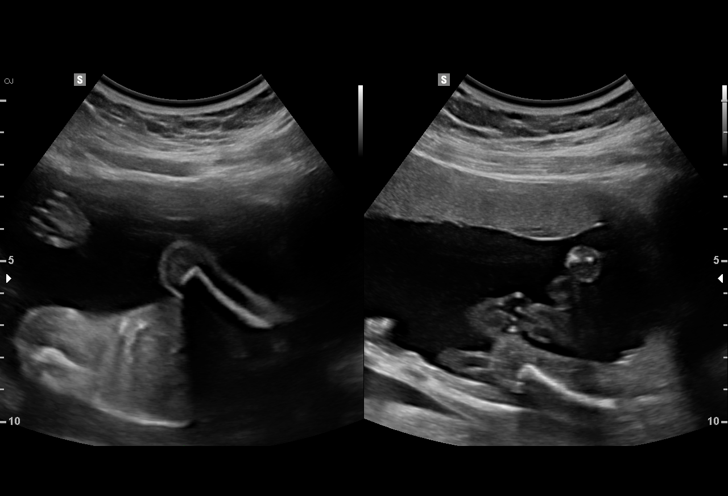
[im 19/57]
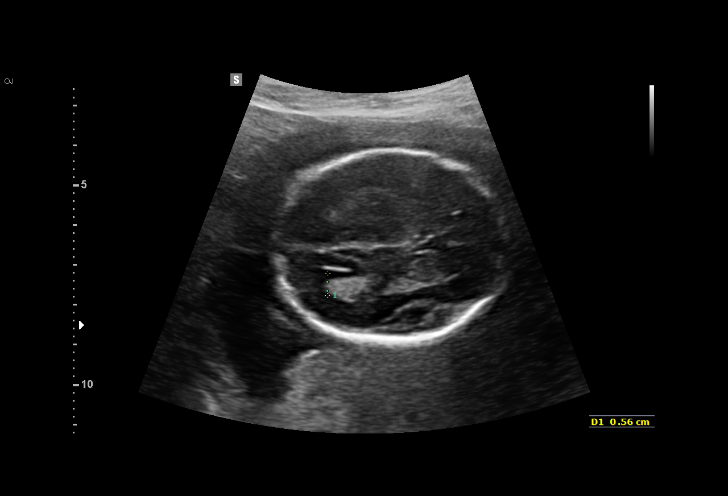
[im 23/57]
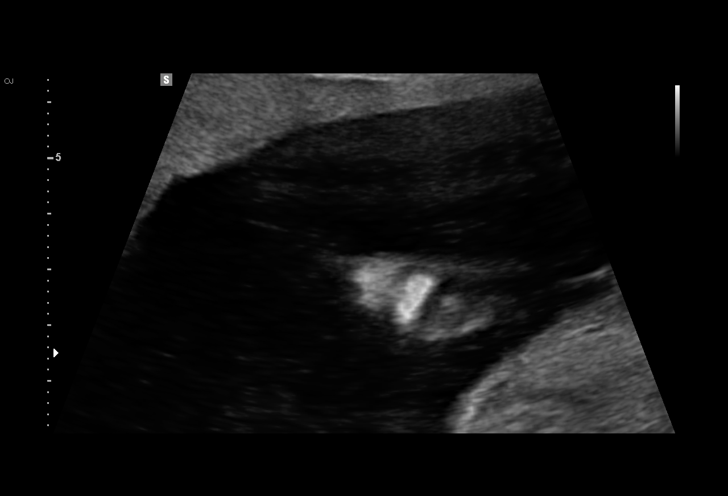
[im 27/57]
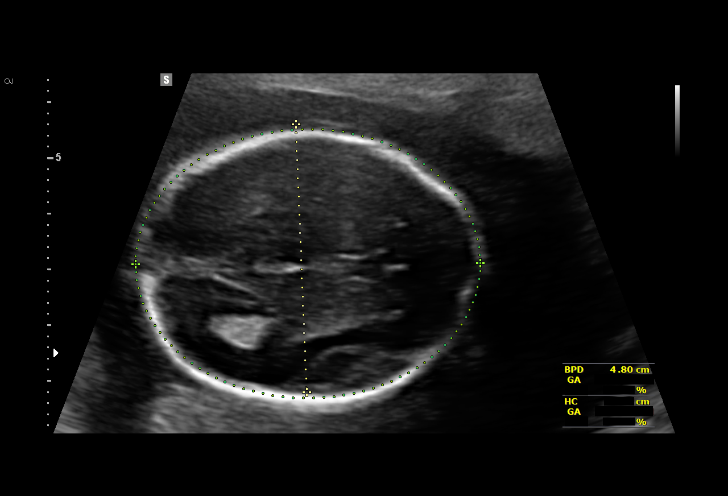
[im 32/57]
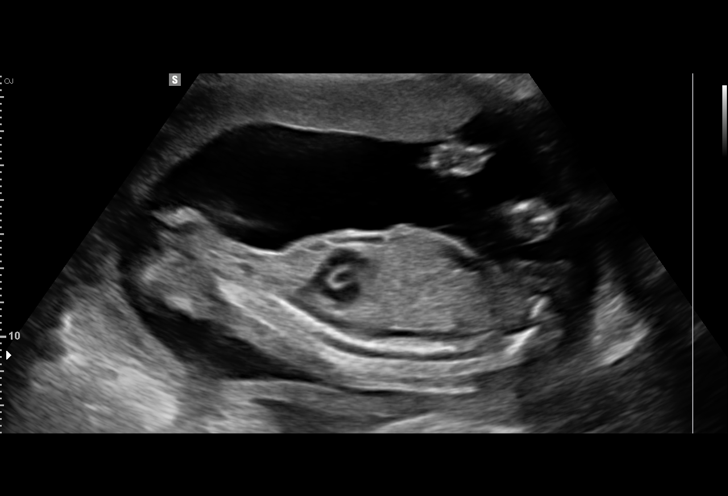
[im 36/57]
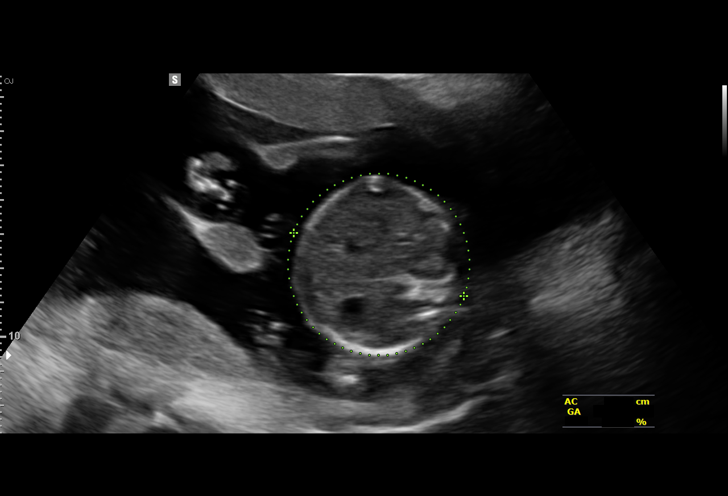
[im 40/57]
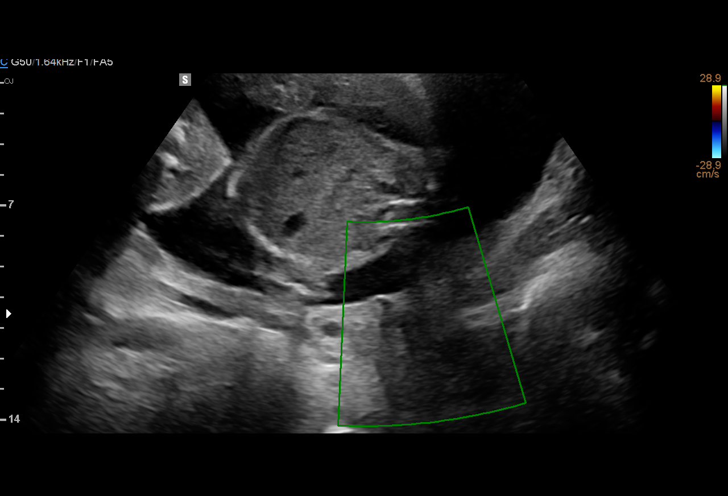
[im 44/57]
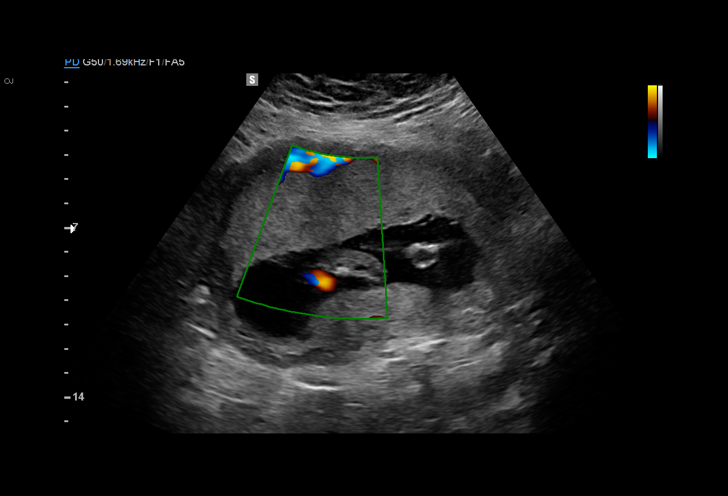
[im 48/57]
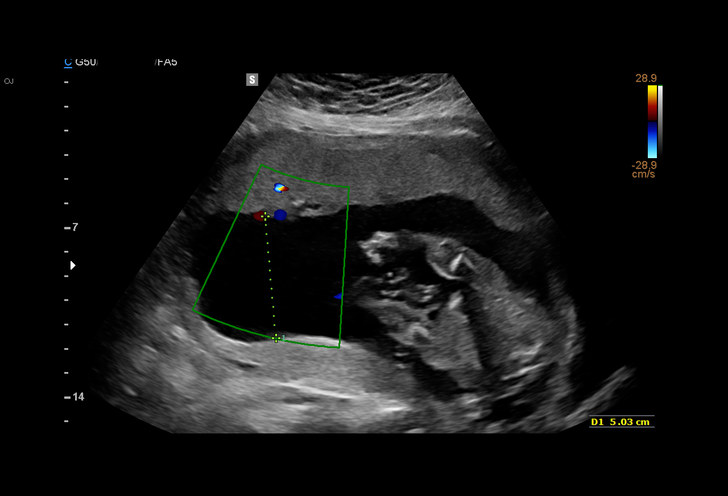
[im 52/57]
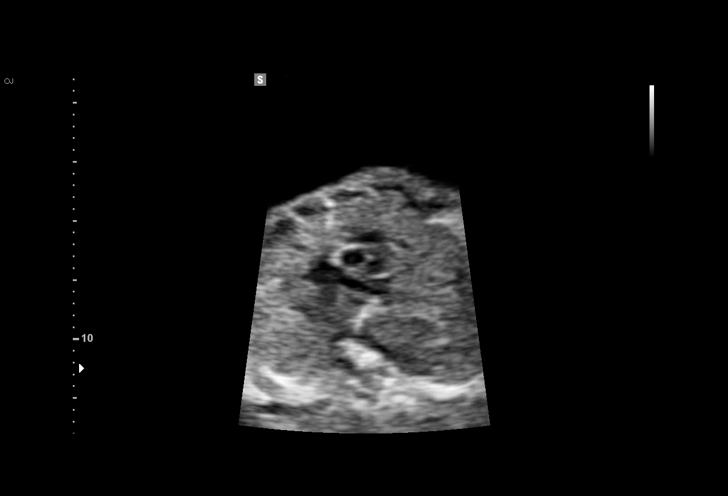
[im 57/57]
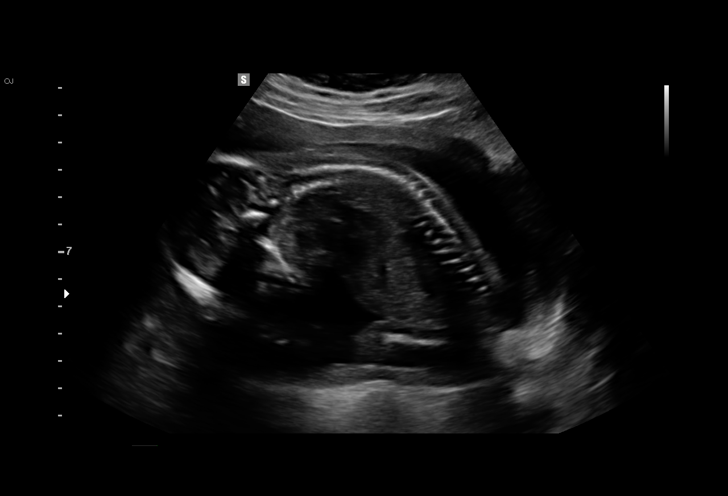

[14 of 28 positions shown; findings below may reference images not displayed]

[facility]

1  JULIUS HOEKSTRA           [id number]      [id number]     [id number]
Indications

19 weeks gestation of pregnancy
Basic anatomic survey                          Z36
Previous cesarean delivery, antepartum
OB History

Blood Type:            Height:  5'3"   Weight (lb):  188       BMI:
Gravidity:    2         Term:   1        Prem:   0        SAB:   0
TOP:          0       Ectopic:  0        Living: 1
Fetal Evaluation

Num Of Fetuses:     1
Fetal Heart         154
Rate(bpm):
Cardiac Activity:   Observed
Presentation:       Breech
Placenta:           Anterior, above cervical os
P. Cord Insertion:  Visualized

Amniotic Fluid
AFI FV:      Subjectively within normal limits

Largest Pocket(cm)
5.0
Biometry

BPD:        48  mm     G. Age:  20w 4d         89  %    CI:        76.79   %    70 - 86
FL/HC:      18.9   %    16.1 -
HC:      173.5  mm     G. Age:  19w 6d         65  %    HC/AC:      1.02        1.09 -
AC:      169.7  mm     G. Age:  22w 0d       > 97  %    FL/BPD:     68.3   %
FL:       32.8  mm     G. Age:  20w 2d         72  %    FL/AC:      19.3   %    20 - 24
HUM:      33.9  mm     G. Age:  21w 4d       > 95  %
CER:      20.2  mm     G. Age:  19w 1d         46  %
NFT:       3.6  mm

CM:        4.8  mm

Est. FW:     394  gm    0 lb 14 oz      69  %
Gestational Age

LMP:           19w 3d        Date:  02/23/15                 EDD:   11/30/15
U/S Today:     20w 5d                                        EDD:   11/21/15
Best:          19w 3d     Det. By:  LMP  (02/23/15)          EDD:   11/30/15
Anatomy

Cranium:               Appears normal         Aortic Arch:            Appears normal
Cavum:                 Appears normal         Ductal Arch:            Appears normal
Ventricles:            Appears normal         Diaphragm:              Appears normal
Choroid Plexus:        Appears normal         Stomach:                Appears normal, left
sided
Cerebellum:            Appears normal         Abdomen:                Appears normal
Posterior Fossa:       Appears normal         Abdominal Wall:         Not well visualized
Nuchal Fold:           Appears normal         Cord Vessels:           Appears normal (3
vessel cord)
Face:                  Appears normal         Kidneys:                Appear normal
(orbits and profile)
Lips:                  Not well visualized    Bladder:                Appears normal
Thoracic:              Appears normal         Spine:                  Not well visualized
Heart:                 Not well visualized    Upper Extremities:      Appears normal
RVOT:                  Appears normal         Lower Extremities:      Appears normal
LVOT:                  Appears normal

Other:  Fetus appears to be a female. Heels and Lt 5th digit visualized.
Cervix Uterus Adnexa

Cervix
Length:            3.6  cm.
Normal appearance by transabdominal scan.
Impression

SIUP at 91w0d
EFW 69th%
no dysmorphic features, noting limited views as documented
above given fetal position
no previa
Recommendations

Interval growth and completion of fetal survey in 6 weeks

## 2019-10-07 ENCOUNTER — Other Ambulatory Visit: Payer: Self-pay | Admitting: Sleep Medicine

## 2019-10-07 ENCOUNTER — Other Ambulatory Visit: Payer: Self-pay

## 2019-10-07 DIAGNOSIS — Z20822 Contact with and (suspected) exposure to covid-19: Secondary | ICD-10-CM

## 2019-10-08 LAB — NOVEL CORONAVIRUS, NAA: SARS-CoV-2, NAA: DETECTED — AB

## 2019-10-08 LAB — SARS-COV-2, NAA 2 DAY TAT

## 2019-10-09 ENCOUNTER — Telehealth: Payer: Self-pay | Admitting: Nurse Practitioner

## 2019-10-09 NOTE — Telephone Encounter (Signed)
Called via Spanish interpreter to discuss with patient about Covid symptoms and the use of casirivimab/imdevimab, a monoclonal antibody infusion for those with mild to moderate Covid symptoms and at a high risk of hospitalization.  Pt is qualified for this infusion at the [facility] due to; Specific high risk criteria : BMI > 25  Message left to call back our hotline [phone].  Nicolasa Ducking, NP

## 2020-12-04 DIAGNOSIS — R7309 Other abnormal glucose: Secondary | ICD-10-CM

## 2020-12-04 LAB — GLUCOSE, POCT (MANUAL RESULT ENTRY): POC Glucose: 113 mg/dl — AB (ref 70–99)

## 2020-12-04 NOTE — Congregational Nurse Program (Signed)
  Dept: [phone]   Congregational Nurse Program Note  Date of Encounter: 12/04/2020  Past Medical History: Past Medical History:  Diagnosis Date   Medical history non-contributory     Encounter Details:  CNP Questionnaire - 12/04/20 1213       Questionnaire   Do you give verbal consent to treat you today? Yes    Location Patient Served  [facility]    Patient Status Immigrant    Insurance Uninsured ([employer]/Self-Pay)    Insurance Referral N/A    Medication Have Medication Insecurities    Medical Provider Yes    Screening Referrals N/A    Medical Referral N/A    Medical Appointment Made N/A    Transportation Need transportation assistance    Housing/Utilities N/A    Interpersonal Safety N/A    Intervention Blood pressure;Blood glucose;Navigate Healthcare System;Educate;Support;Counsel    ED Visit Averted Yes    Life-Saving Intervention Made N/A             Patient came to establish care with Engineer, technical sales.She reports increased weight. Today weight 199.5 lbs. She complains of numbness and tingling sensation occasionally but not willing to see a doctor. She does not have health insurance.She would like to loose weight and see if the issue will resolve. I have educated her on heart healthy diet and exercise.She will come in 2 weeks for follow up with me. Nicole Cella Oreste Majeed RN BSn PCCN  [facility] [phone]

## 2020-12-25 ENCOUNTER — Other Ambulatory Visit: Payer: Self-pay

## 2020-12-25 DIAGNOSIS — R635 Abnormal weight gain: Secondary | ICD-10-CM

## 2020-12-25 LAB — GLUCOSE, POCT (MANUAL RESULT ENTRY): POC Glucose: 117 mg/dl — AB (ref 70–99)

## 2020-12-25 NOTE — Congregational Nurse Program (Signed)
Patient came in for weight monitoring. Today weight is 201 lbs

## 2021-03-08 ENCOUNTER — Other Ambulatory Visit: Payer: Self-pay

## 2021-03-08 ENCOUNTER — Emergency Department (HOSPITAL_COMMUNITY): Payer: Self-pay

## 2021-03-08 ENCOUNTER — Encounter (HOSPITAL_COMMUNITY): Payer: Self-pay

## 2021-03-08 ENCOUNTER — Emergency Department (HOSPITAL_COMMUNITY)
Admission: EM | Admit: 2021-03-08 | Discharge: 2021-03-08 | Disposition: A | Discharge status: Home / Self Care | Payer: Self-pay | Attending: Emergency Medicine | Admitting: Emergency Medicine

## 2021-03-08 DIAGNOSIS — R079 Chest pain, unspecified: Secondary | ICD-10-CM | POA: Insufficient documentation

## 2021-03-08 DIAGNOSIS — R1013 Epigastric pain: Secondary | ICD-10-CM | POA: Insufficient documentation

## 2021-03-08 DIAGNOSIS — Z79899 Other long term (current) drug therapy: Secondary | ICD-10-CM | POA: Insufficient documentation

## 2021-03-08 LAB — CBC WITH DIFFERENTIAL/PLATELET
Abs Immature Granulocytes: 0.04 10*3/uL (ref 0.00–0.07)
Basophils Absolute: 0.1 10*3/uL (ref 0.0–0.1)
Basophils Relative: 1 %
Eosinophils Absolute: 0.2 10*3/uL (ref 0.0–0.5)
Eosinophils Relative: 2 %
HCT: 40.3 % (ref 36.0–46.0)
Hemoglobin: 13.7 g/dL (ref 12.0–15.0)
Immature Granulocytes: 0 %
Lymphocytes Relative: 44 %
Lymphs Abs: 4.4 10*3/uL — ABNORMAL HIGH (ref 0.7–4.0)
MCH: 30.5 pg (ref 26.0–34.0)
MCHC: 34 g/dL (ref 30.0–36.0)
MCV: 89.8 fL (ref 80.0–100.0)
Monocytes Absolute: 0.7 10*3/uL (ref 0.1–1.0)
Monocytes Relative: 7 %
Neutro Abs: 4.5 10*3/uL (ref 1.7–7.7)
Neutrophils Relative %: 46 %
Platelets: 307 10*3/uL (ref 150–400)
RBC: 4.49 MIL/uL (ref 3.87–5.11)
RDW: 12.1 % (ref 11.5–15.5)
WBC: 9.8 10*3/uL (ref 4.0–10.5)
nRBC: 0 % (ref 0.0–0.2)

## 2021-03-08 LAB — COMPREHENSIVE METABOLIC PANEL
ALT: 32 U/L (ref 0–44)
AST: 67 U/L — ABNORMAL HIGH (ref 15–41)
Albumin: 3.5 g/dL (ref 3.5–5.0)
Alkaline Phosphatase: 52 U/L (ref 38–126)
Anion gap: 9 (ref 5–15)
BUN: 11 mg/dL (ref 6–20)
CO2: 23 mmol/L (ref 22–32)
Calcium: 7.9 mg/dL — ABNORMAL LOW (ref 8.9–10.3)
Chloride: 109 mmol/L (ref 98–111)
Creatinine, Ser: 0.84 mg/dL (ref 0.44–1.00)
GFR, Estimated: >60 mL/min (ref >60)
Glucose, Bld: 135 mg/dL — ABNORMAL HIGH (ref 70–99)
Potassium: 3.9 mmol/L (ref 3.5–5.1)
Sodium: 141 mmol/L (ref 135–145)
Total Bilirubin: 0.4 mg/dL (ref 0.3–1.2)
Total Protein: 6.2 g/dL — ABNORMAL LOW (ref 6.5–8.1)

## 2021-03-08 LAB — TROPONIN I (HIGH SENSITIVITY)
Troponin I (High Sensitivity): 5 ng/L (ref <18)
Troponin I (High Sensitivity): 3 ng/L (ref <18)

## 2021-03-08 LAB — LIPASE, BLOOD: Lipase: 34 U/L (ref 11–51)

## 2021-03-08 LAB — I-STAT BETA HCG BLOOD, ED (MC, WL, AP ONLY): I-stat hCG, quantitative: <5 m[IU]/mL (ref <5)

## 2021-03-08 MED ORDER — ONDANSETRON 4 MG PO TBDP: ORAL_TABLET | ORAL | 0 refills | Status: DC

## 2021-03-08 NOTE — Discharge Instructions (Signed)
Try pepcid or tagamet up to twice a day.  Try to avoid things that may make this worse, most commonly these are spicy foods tomato based products fatty foods chocolate and peppermint.  Alcohol and tobacco can also make this worse.  Return to the emergency department for sudden worsening pain fever or inability to eat or drink.  Pruebe pepcid o tagamet hasta dos veces al da. Trate de evitar las cosas que pueden empeorar esto, ms comnmente estos son alimentos picantes, productos a base de tomate, alimentos grasos, chocolate y menta. El alcohol y el tabaco tambin pueden empeorar esto. Regrese a la sala de emergencias si el dolor empeora repentinamente, la fiebre o la incapacidad para comer o beber.  

## 2021-03-08 NOTE — ED Provider Notes (Signed)
[facility] Provider [facility]   CSN: [id number] Arrival date & time: 03/08/21  0436     History  Chief Complaint  Patient presents with   Chest Pain    Ernestine Conradlizabeth Vazquez Gonzalez is a 36 y.o. female.  36 yo F with a chief complaints of epigastric abdominal discomfort.  This been off and on for a few months now.  Usually worse with eating.  She had an episode last night where she felt like it was much more severe ended up radiating into her back.  This has got much better by the time my examination.  No fevers no vomiting.  Denies trauma.  The history is provided by the patient.  Chest Pain Associated symptoms: abdominal pain   Associated symptoms: no dizziness, no fever, no headache, no nausea, no palpitations, no shortness of breath and no vomiting   Illness Severity:  Moderate Onset quality:  Gradual Duration:  2 months Timing:  Intermittent Progression:  Waxing and waning Chronicity:  Recurrent Associated symptoms: abdominal pain and chest pain   Associated symptoms: no congestion, no fever, no headaches, no myalgias, no nausea, no rhinorrhea, no shortness of breath, no vomiting and no wheezing       Home Medications Prior to Admission medications   Medication Sig Start Date End Date Taking? Authorizing Provider  ondansetron (ZOFRAN-ODT) 4 MG disintegrating tablet 4mg  ODT q4 hours prn nausea/vomit 03/08/21  Yes Melene PlanFloyd, Waymond Meador, DO  acetaminophen (TYLENOL) 325 MG tablet Take 2 tablets (650 mg total) by mouth every 4 (four) hours as needed (for pain scale < 4). 11/11/17   Denzil HughesAsh, Kelsey D, MD  ibuprofen (ADVIL,MOTRIN) 600 MG tablet Take 1 tablet (600 mg total) by mouth every 6 (six) hours. 11/11/17   Denzil HughesAsh, Kelsey D, MD  Prenatal Vit-Fe Fumarate-FA (PRENATAL MULTIVITAMIN) TABS tablet Take 1 tablet by mouth daily at 12 noon.    [provider]      Allergies    Patient has no known allergies.    Review of Systems   Review of Systems   Constitutional:  Negative for chills and fever.  HENT:  Negative for congestion and rhinorrhea.   Eyes:  Negative for redness and visual disturbance.  Respiratory:  Negative for shortness of breath and wheezing.   Cardiovascular:  Positive for chest pain. Negative for palpitations.  Gastrointestinal:  Positive for abdominal pain. Negative for nausea and vomiting.  Genitourinary:  Negative for dysuria and urgency.  Musculoskeletal:  Negative for arthralgias and myalgias.  Skin:  Negative for pallor and wound.  Neurological:  Negative for dizziness and headaches.   Physical Exam Updated Vital Signs BP 118/62    Pulse 66    Temp 98.7 F (37.1 C)    Resp 15    Ht 5\' 1"  (1.549 m)    Wt 91 kg    LMP 02/18/2021 (Exact Date)    SpO2 100%    BMI 37.91 kg/m  Physical Exam Vitals and nursing note reviewed.  Constitutional:      General: She is not in acute distress.    Appearance: She is well-developed. She is not diaphoretic.     Comments: BMI 38  HENT:     Head: Normocephalic and atraumatic.  Eyes:     Pupils: Pupils are equal, round, and reactive to light.  Cardiovascular:     Rate and Rhythm: Normal rate and regular rhythm.     Heart sounds: No murmur heard.   No friction rub. No  gallop.  Pulmonary:     Effort: Pulmonary effort is normal.     Breath sounds: No wheezing or rales.  Abdominal:     General: There is no distension.     Palpations: Abdomen is soft.     Tenderness: There is abdominal tenderness (mild epigastric, no significant RUQ, negative murphys).  Musculoskeletal:        General: No tenderness.     Cervical back: Normal range of motion and neck supple.  Skin:    General: Skin is warm and dry.  Neurological:     Mental Status: She is alert and oriented to person, place, and time.  Psychiatric:        Behavior: Behavior normal.    ED Results / Procedures / Treatments   Labs (all labs ordered are listed, but only abnormal results are displayed) Labs Reviewed   CBC WITH DIFFERENTIAL/PLATELET - Abnormal; Notable for the following components:      Result Value   Lymphs Abs 4.4 (*)    All other components within normal limits  COMPREHENSIVE METABOLIC PANEL - Abnormal; Notable for the following components:   Glucose, Bld 135 (*)    Calcium 7.9 (*)    Total Protein 6.2 (*)    AST 67 (*)    All other components within normal limits  LIPASE, BLOOD  I-STAT BETA HCG BLOOD, ED ([facility], [facility], [facility] ONLY)  TROPONIN I (HIGH SENSITIVITY)  TROPONIN I (HIGH SENSITIVITY)    EKG None  Radiology DG Chest 2 View  Result Date: 03/08/2021 CLINICAL DATA:  36 year old female with chest pain. EXAM: CHEST - 2 VIEW COMPARISON:  Portable chest 05/29/2014. FINDINGS: Lung volumes and mediastinal contours remain normal. Visualized tracheal air column is within normal limits. Both lungs appear stable and clear. No pneumothorax. No osseous abnormality identified. Negative visible bowel gas. IMPRESSION: Negative.  No cardiopulmonary abnormality. Electronically Signed   By: Odessa Fleming M.D.   On: 03/08/2021 06:29   US Abdomen Limited RUQ (LIVER/GB)  Result Date: 03/08/2021 CLINICAL DATA:  Epigastric pain. EXAM: ULTRASOUND ABDOMEN LIMITED RIGHT UPPER QUADRANT COMPARISON:  Chest XR, concurrent. FINDINGS: Gallbladder: Small volume of dependent biliary sludge is present within a minimally distended gallbladder. No wall thickening visualized. No sonographic Murphy sign noted by sonographer. Common bile duct: Diameter: 0.4 cm Liver: No focal lesion identified. Increased hepatic parenchymal echogenicity. Portal vein is patent on color Doppler imaging with normal direction of blood flow towards the liver. Other: No perihepatic ascites. IMPRESSION: 1. Biliary sludge, without additional sonographic findings to suggest acute cholecystitis. 2. Echogenic liver. Findings most commonly seen in hepatic steatosis, though may also represent hepatitis and/or fibrosis. Electronically Signed   By: Roanna Banning  M.D.   On: 03/08/2021 06:28    Procedures Procedures    Medications Ordered in ED Medications - No data to display  ED Course/ Medical Decision Making/ A&P                           Medical Decision Making Risk Prescription drug management.   36 yo F with a chief complaints of epigastric abdominal discomfort that radiates to the chest and the back.  This occurred after eating a meal last night.  Has significantly improved.  She has had trouble with this off and on for couple months now.  Had a work-up ordered in triage had a chest x-ray viewed by me without focal infiltrate or pneumothorax.  EKG without concerning finding.  2 troponins are negative.  The patient is not pregnant.  AST trivially elevated other LFTs unremarkable.  Lipase is normal.  Right upper quadrant ultrasound was ordered and shows sludge but no signs of gallbladder inflammation.  2 possible diagnoses biliary colic with sludge in the gallbladder versus reflux.  Seems more likely to be reflux based on further history from the patient having issues lying back flat immediately at bedtime described a burning sensation into her chest.  We will have her start H2 blockers.  Given general surgery follow-up for possible biliary colic.  The patient's record was reviewed the patient is currently in a program through [facility] to try and get her to lose weight.  Currently without diabetes.  10:27 AM:  I have discussed the diagnosis/risks/treatment options with the patient and family and believe the pt to be eligible for discharge home to follow-up with PCP, Gen surgery. We also discussed returning to the ED immediately if new or worsening sx occur. We discussed the sx which are most concerning (e.g., sudden worsening pain, fever, inability to tolerate by mouth) that necessitate immediate return. Medications administered to the patient during their visit and any new prescriptions provided to the patient are listed below.  Medications  given during this visit Medications - No data to display   The patient appears reasonably screen and/or stabilized for discharge and I doubt any other medical condition or other [facility] requiring further screening, evaluation, or treatment in the ED at this time prior to discharge.            Final Clinical Impression(s) / ED Diagnoses Final diagnoses:  Epigastric pain    Rx / DC Orders ED Discharge Orders          Ordered    ondansetron (ZOFRAN-ODT) 4 MG disintegrating tablet        03/08/21 1013              [place], DO 03/08/21 1027

## 2021-03-08 NOTE — ED Triage Notes (Signed)
Pt presents to the ED from home with complaints of central CP onset tonight. Pt denies N/V, cough, congestion, fever. Pt states it feels like sharp pressure in center of chest.

## 2021-03-08 NOTE — ED Provider Triage Note (Signed)
Emergency Medicine Provider Triage Evaluation Note  Lacey Sanders , a 36 y.o. female  was evaluated in triage.  Pt complains of chest/epigastric pain.  Woke up with it this morning.  States she feels like it starts in the abdomen and radiates up.  Feels pain a little into her back.  Denies nausea/vomiting.  No fever.  No cardiac history.  Review of Systems  Positive: Chest pain, epigastric pain Negative: Fever, vomiting  Physical Exam  BP (!) 107/57 (BP Location: Left Arm)    Pulse 65    Temp (!) 97.4 F (36.3 C) (Oral)    Resp 17    SpO2 100%   Gen:   Awake, no distress   Resp:  Normal effort  MSK:   Moves extremities without difficulty  Other:  Mildly tender epigastric region  Medical Decision Making  Medically screening exam initiated at 4:55 AM.  Appropriate orders placed.  Lacey Sanders was informed that the remainder of the evaluation will be completed by another provider, this initial triage assessment does not replace that evaluation, and the importance of remaining in the ED until their evaluation is complete.  Chest pain, epigastric pain.  EKG, labs, CXR, RUQ [place].   Garlon Hatchet, PA-C 03/08/21 [phone]

## 2021-08-21 ENCOUNTER — Encounter (HOSPITAL_BASED_OUTPATIENT_CLINIC_OR_DEPARTMENT_OTHER): Payer: Self-pay

## 2021-08-21 ENCOUNTER — Emergency Department (HOSPITAL_BASED_OUTPATIENT_CLINIC_OR_DEPARTMENT_OTHER): Payer: Medicaid Other

## 2021-08-21 ENCOUNTER — Other Ambulatory Visit: Payer: Self-pay

## 2021-08-21 ENCOUNTER — Emergency Department (HOSPITAL_BASED_OUTPATIENT_CLINIC_OR_DEPARTMENT_OTHER)
Admission: EM | Admit: 2021-08-21 | Discharge: 2021-08-22 | Disposition: A | Discharge status: Home / Self Care | Payer: Medicaid Other | Attending: Emergency Medicine | Admitting: Emergency Medicine

## 2021-08-21 DIAGNOSIS — R1031 Right lower quadrant pain: Secondary | ICD-10-CM | POA: Diagnosis present

## 2021-08-21 DIAGNOSIS — N83202 Unspecified ovarian cyst, left side: Secondary | ICD-10-CM | POA: Diagnosis not present

## 2021-08-21 DIAGNOSIS — K5730 Diverticulosis of large intestine without perforation or abscess without bleeding: Secondary | ICD-10-CM | POA: Diagnosis not present

## 2021-08-21 DIAGNOSIS — R55 Syncope and collapse: Secondary | ICD-10-CM | POA: Diagnosis not present

## 2021-08-21 DIAGNOSIS — R1030 Lower abdominal pain, unspecified: Secondary | ICD-10-CM

## 2021-08-21 DIAGNOSIS — R1032 Left lower quadrant pain: Secondary | ICD-10-CM | POA: Diagnosis not present

## 2021-08-21 LAB — URINALYSIS, ROUTINE W REFLEX MICROSCOPIC
Bilirubin Urine: NEGATIVE
Glucose, UA: NEGATIVE mg/dL
Ketones, ur: NEGATIVE mg/dL
Nitrite: NEGATIVE
Protein, ur: NEGATIVE mg/dL
Specific Gravity, Urine: <=1.005 (ref 1.005–1.030)
pH: 6 (ref 5.0–8.0)

## 2021-08-21 LAB — COMPREHENSIVE METABOLIC PANEL
ALT: 19 U/L (ref 0–44)
AST: 17 U/L (ref 15–41)
Albumin: 4.3 g/dL (ref 3.5–5.0)
Alkaline Phosphatase: 56 U/L (ref 38–126)
Anion gap: 5 (ref 5–15)
BUN: 12 mg/dL (ref 6–20)
CO2: 26 mmol/L (ref 22–32)
Calcium: 9.3 mg/dL (ref 8.9–10.3)
Chloride: 107 mmol/L (ref 98–111)
Creatinine, Ser: 0.58 mg/dL (ref 0.44–1.00)
GFR, Estimated: >60 mL/min (ref >60)
Glucose, Bld: 93 mg/dL (ref 70–99)
Potassium: 4.1 mmol/L (ref 3.5–5.1)
Sodium: 138 mmol/L (ref 135–145)
Total Bilirubin: 0.6 mg/dL (ref 0.3–1.2)
Total Protein: 7.8 g/dL (ref 6.5–8.1)

## 2021-08-21 LAB — CBC
HCT: 39.5 % (ref 36.0–46.0)
Hemoglobin: 13.6 g/dL (ref 12.0–15.0)
MCH: 30.6 pg (ref 26.0–34.0)
MCHC: 34.4 g/dL (ref 30.0–36.0)
MCV: 89 fL (ref 80.0–100.0)
Platelets: 348 10*3/uL (ref 150–400)
RBC: 4.44 MIL/uL (ref 3.87–5.11)
RDW: 12.5 % (ref 11.5–15.5)
WBC: 7.1 10*3/uL (ref 4.0–10.5)
nRBC: 0 % (ref 0.0–0.2)

## 2021-08-21 LAB — LIPASE, BLOOD: Lipase: 33 U/L (ref 11–51)

## 2021-08-21 LAB — URINALYSIS, MICROSCOPIC (REFLEX): RBC / HPF: >50 RBC/hpf (ref 0–5)

## 2021-08-21 LAB — PREGNANCY, URINE: Preg Test, Ur: NEGATIVE

## 2021-08-21 NOTE — ED Triage Notes (Signed)
C/o abdominal pain radiating to back since Sunday. Denies urinary symptoms. Denies vomiting/diarrhea, c/o nausea. Syncope at 0400 on Monday when ambulating to restroom.  Spanish interpreter during triage.

## 2021-08-21 NOTE — ED Notes (Signed)
ED Provider at bedside. 

## 2021-08-21 NOTE — ED Notes (Signed)
Patient transported to CT 

## 2021-08-21 NOTE — ED Provider Notes (Signed)
[facility] EMERGENCY DEPARTMENT Provider Note   CSN: [id number] Arrival date & time: 08/21/21  1704     History {Add pertinent medical, surgical, social history, OB history to HPI:1} Chief Complaint  Patient presents with   Abdominal Pain    Anselma Herbel is a 36 y.o. female.  HPI      Pain started on Sunday, abdominal pain, hurting like it was cramping, went on all day Monday AM 4 pain was so severe couldn't walk Pain was so severe had LOC Nausea, dizzy, no vomiting No diarrhea or constipation No pain with urination Now pain inside/womb, but more left lower and to back, when sitting is uncomfortable, radiates to bottom for one month Turning to right side ribs uncomfortable  On menses now Since January menses irregular/discharge  In November put in a referral for biopsy, uterine wall , recently called to schedule it.   Home Medications Prior to Admission medications   Medication Sig Start Date End Date Taking? Authorizing Provider  acetaminophen (TYLENOL) 325 MG tablet Take 2 tablets (650 mg total) by mouth every 4 (four) hours as needed (for pain scale < 4). 11/11/17   Denzil Hughes, MD  ibuprofen (ADVIL,MOTRIN) 600 MG tablet Take 1 tablet (600 mg total) by mouth every 6 (six) hours. 11/11/17   Denzil Hughes, MD  ondansetron (ZOFRAN-ODT) 4 MG disintegrating tablet 4mg  ODT q4 hours prn nausea/vomit 03/08/21   03/10/21, DO  Prenatal Vit-Fe Fumarate-FA (PRENATAL MULTIVITAMIN) TABS tablet Take 1 tablet by mouth daily at 12 noon.    [provider]      Allergies    Patient has no known allergies.    Review of Systems   Review of Systems  Physical Exam Updated Vital Signs BP 124/79 (BP Location: Left Arm)   Pulse 71   Temp 98.4 F (36.9 C) (Oral)   Resp 18   Ht 5\' 3"  (1.6 m)   Wt 86.2 kg   LMP 08/18/2021 (Exact Date)   SpO2 100%   BMI 33.66 kg/m  Physical Exam  ED Results / Procedures / Treatments   Labs (all labs ordered  are listed, but only abnormal results are displayed) Labs Reviewed  URINALYSIS, ROUTINE W REFLEX MICROSCOPIC - Abnormal; Notable for the following components:      Result Value   Hgb urine dipstick LARGE (*)    Leukocytes,Ua TRACE (*)    All other components within normal limits  URINALYSIS, MICROSCOPIC (REFLEX) - Abnormal; Notable for the following components:   Bacteria, UA RARE (*)    All other components within normal limits  LIPASE, BLOOD  COMPREHENSIVE METABOLIC PANEL  CBC  PREGNANCY, URINE    EKG EKG Interpretation  Date/Time:  Wednesday August 21 2021 17:18:29 EDT Ventricular Rate:  61 PR Interval:  128 QRS Duration: 82 QT Interval:  392 QTC Calculation: 394 R Axis:   48 Text Interpretation: Normal sinus rhythm Normal ECG When compared with ECG of 08-Mar-2021 04:52, No significant change since last tracing Confirmed by [id number] (10-Mar-2021) on 08/21/2021 7:47:25 PM  Radiology No results found.  Procedures Procedures  {Document cardiac monitor, telemetry assessment procedure when appropriate:1}  Medications Ordered in ED Medications - No data to display  ED Course/ Medical Decision Making/ A&P                           Medical Decision Making Amount and/or Complexity of Data Reviewed Labs: ordered. Radiology: ordered.   ***  {  Document critical care time when appropriate:1} {Document review of labs and clinical decision tools ie heart score, Chads2Vasc2 etc:1}  {Document your independent review of radiology images, and any outside records:1} {Document your discussion with family members, caretakers, and with consultants:1} {Document social determinants of health affecting pt's care:1} {Document your decision making why or why not admission, treatments were needed:1} Final Clinical Impression(s) / ED Diagnoses Final diagnoses:  None    Rx / DC Orders ED Discharge Orders     None

## 2021-08-22 NOTE — ED Notes (Signed)
Written and verbal inst to pt and her mother  Verbalized an understanding  To home with family

## 2021-09-25 ENCOUNTER — Ambulatory Visit (INDEPENDENT_AMBULATORY_CARE_PROVIDER_SITE_OTHER): Payer: Medicaid Other | Admitting: Nurse Practitioner

## 2021-09-25 ENCOUNTER — Encounter: Payer: Self-pay | Admitting: Nurse Practitioner

## 2021-09-25 VITALS — BP 123/64 | HR 83 | Temp 98.2°F | Ht 63.0 in | Wt 188.0 lb

## 2021-09-25 DIAGNOSIS — N926 Irregular menstruation, unspecified: Secondary | ICD-10-CM

## 2021-09-25 DIAGNOSIS — M25552 Pain in left hip: Secondary | ICD-10-CM

## 2021-09-25 LAB — POCT URINE PREGNANCY: Preg Test, Ur: NEGATIVE

## 2021-09-25 MED ORDER — IBUPROFEN 600 MG PO TABS: 600.0000 mg | ORAL_TABLET | Freq: Three times a day (TID) | ORAL | 0 refills | Status: DC | PRN

## 2021-09-25 MED ORDER — TIZANIDINE HCL 4 MG PO TABS: 4.0000 mg | ORAL_TABLET | Freq: Four times a day (QID) | ORAL | 0 refills | Status: DC | PRN

## 2021-09-25 NOTE — Patient Instructions (Addendum)
1. Irregular menstrual cycle  - CBC - Comprehensive metabolic panel - Ambulatory referral to Obstetrics / Gynecology - TSH  2. Left hip pain  - ibuprofen (ADVIL) 600 MG tablet; Take 1 tablet (600 mg total) by mouth every 8 (eight) hours as needed.  Dispense: 30 tablet; Refill: 0 - tiZANidine (ZANAFLEX) 4 MG tablet; Take 1 tablet (4 mg total) by mouth every 6 (six) hours as needed for muscle spasms.  Dispense: 30 tablet; Refill: 0  Follow up:  Follow up in 3 months or sooner if needed

## 2021-09-25 NOTE — Assessment & Plan Note (Signed)
-   CBC - Comprehensive metabolic panel - Ambulatory referral to Obstetrics / Gynecology - TSH  2. Left hip pain  - ibuprofen (ADVIL) 600 MG tablet; Take 1 tablet (600 mg total) by mouth every 8 (eight) hours as needed.  Dispense: 30 tablet; Refill: 0 - tiZANidine (ZANAFLEX) 4 MG tablet; Take 1 tablet (4 mg total) by mouth every 6 (six) hours as needed for muscle spasms.  Dispense: 30 tablet; Refill: 0  Follow up:  Follow up in 3 months or sooner if needed

## 2021-09-25 NOTE — Progress Notes (Signed)
@Patient  ID: , female    DOB: Dec 17, 1985, 36 y.o.   MRN: [id number]  Chief Complaint  Patient presents with   New Patient (Initial Visit)    Frequent menstrual cycles, increasing in length/frequency over past several months; denies abd pain, fever, nausea, emesis    Referring provider: [id number], MD   HPI  7 year ols female with no significant health history.   Patient presents to establish care. Patient complains of menstrual problems. Last menstrual cycle July 31st. Is still currently on menstrual cycle.  We will check labs any associated anemia.  We will also place referral for OB/GYN.  Patient declines birth control at this time.  Patient also complains of left hip.  She states over the last few weeks she has noticed this 3 different times.  She states that she notices this more when working. She states that she does have good ROM. She denies any recent injury.   Denies f/c/s, n/v/d, hemoptysis, PND, leg swelling Denies chest pain or edema     No Known Allergies  Immunization History  Administered Date(s) Administered   Influenza Split 11/05/2012   Influenza,inj,Quad PF,6+ Mos 12/05/2013, 05/29/2017, 10/28/2017   Tdap 09/04/2015, 09/01/2017    Past Medical History:  Diagnosis Date   Medical history non-contributory     Tobacco History: Social History   Tobacco Use  Smoking Status Never  Smokeless Tobacco Never   Counseling given: Not Answered   Outpatient Encounter Medications as of 09/25/2021  Medication Sig   ibuprofen (ADVIL) 600 MG tablet Take 1 tablet (600 mg total) by mouth every 8 (eight) hours as needed.   Prenatal Vit-Fe Fumarate-FA (PRENATAL MULTIVITAMIN) TABS tablet Take 1 tablet by mouth daily at 12 noon.   tiZANidine (ZANAFLEX) 4 MG tablet Take 1 tablet (4 mg total) by mouth every 6 (six) hours as needed for muscle spasms.   acetaminophen (TYLENOL) 325 MG tablet Take 2 tablets (650 mg total) by mouth every 4 (four)  hours as needed (for pain scale < 4). (Patient not taking: Reported on 09/25/2021)   ondansetron (ZOFRAN-ODT) 4 MG disintegrating tablet 4mg  ODT q4 hours prn nausea/vomit (Patient not taking: Reported on 09/25/2021)   [DISCONTINUED] ibuprofen (ADVIL,MOTRIN) 600 MG tablet Take 1 tablet (600 mg total) by mouth every 6 (six) hours. (Patient not taking: Reported on 09/25/2021)   No facility-administered encounter medications on file as of 09/25/2021.     Review of Systems  Review of Systems  Constitutional: Negative.   HENT: Negative.    Cardiovascular: Negative.   Gastrointestinal: Negative.   Genitourinary:  Positive for menstrual problem and vaginal bleeding.  Musculoskeletal:        Left hip pain - intermittent   Allergic/Immunologic: Negative.   Neurological: Negative.   Psychiatric/Behavioral: Negative.         Physical Exam  BP (!) 135/93   Pulse 83   Temp 98.2 F (36.8 C) (Temporal)   Ht 5\' 3"  (1.6 m)   Wt 188 lb (85.3 kg)   LMP 09/16/2021   SpO2 100%   BMI 33.30 kg/m   Wt Readings from Last 5 Encounters:  09/25/21 188 lb (85.3 kg)  08/21/21 190 lb (86.2 kg)  03/08/21 200 lb 9.9 oz (91 kg)  12/25/20 201 lb (91.2 kg)  12/04/20 199 lb 8 oz (90.5 kg)     Physical Exam Vitals and nursing note reviewed.  Constitutional:      General: She is not in acute distress.    Appearance:  She is well-developed.  Cardiovascular:     Rate and Rhythm: Normal rate and regular rhythm.  Pulmonary:     Effort: Pulmonary effort is normal.     Breath sounds: Normal breath sounds.  Neurological:     Mental Status: She is alert and oriented to person, place, and time.  Psychiatric:        Mood and Affect: Mood normal.        Behavior: Behavior normal.      Assessment & Plan:   Irregular menstrual cycle - CBC - Comprehensive metabolic panel - Ambulatory referral to Obstetrics / Gynecology - TSH  2. Left hip pain  - ibuprofen (ADVIL) 600 MG tablet; Take 1 tablet (600 mg  total) by mouth every 8 (eight) hours as needed.  Dispense: 30 tablet; Refill: 0 - tiZANidine (ZANAFLEX) 4 MG tablet; Take 1 tablet (4 mg total) by mouth every 6 (six) hours as needed for muscle spasms.  Dispense: 30 tablet; Refill: 0  Follow up:  Follow up in 3 months or sooner if needed     Ivonne Andrew, NP 09/25/2021

## 2021-09-26 LAB — COMPREHENSIVE METABOLIC PANEL
ALT: 20 IU/L (ref 0–32)
AST: 19 IU/L (ref 0–40)
Albumin/Globulin Ratio: 2 (ref 1.2–2.2)
Albumin: 4.8 g/dL (ref 3.9–4.9)
Alkaline Phosphatase: 59 IU/L (ref 44–121)
BUN/Creatinine Ratio: 12 (ref 9–23)
BUN: 8 mg/dL (ref 6–20)
Bilirubin Total: 0.3 mg/dL (ref 0.0–1.2)
CO2: 22 mmol/L (ref 20–29)
Calcium: 9.8 mg/dL (ref 8.7–10.2)
Chloride: 103 mmol/L (ref 96–106)
Creatinine, Ser: 0.66 mg/dL (ref 0.57–1.00)
Globulin, Total: 2.4 g/dL (ref 1.5–4.5)
Glucose: 96 mg/dL (ref 70–99)
Potassium: 4.7 mmol/L (ref 3.5–5.2)
Sodium: 140 mmol/L (ref 134–144)
Total Protein: 7.2 g/dL (ref 6.0–8.5)
eGFR: 118 mL/min/{1.73_m2} (ref >59)

## 2021-09-26 LAB — CBC
Hematocrit: 43.1 % (ref 34.0–46.6)
Hemoglobin: 14.3 g/dL (ref 11.1–15.9)
MCH: 30.5 pg (ref 26.6–33.0)
MCHC: 33.2 g/dL (ref 31.5–35.7)
MCV: 92 fL (ref 79–97)
Platelets: 286 10*3/uL (ref 150–450)
RBC: 4.69 x10E6/uL (ref 3.77–5.28)
RDW: 12.6 % (ref 11.7–15.4)
WBC: 7 10*3/uL (ref 3.4–10.8)

## 2021-09-26 LAB — TSH: TSH: 1.97 u[IU]/mL (ref 0.450–4.500)

## 2021-10-17 ENCOUNTER — Ambulatory Visit: Payer: Self-pay | Admitting: Family Medicine

## 2021-11-07 ENCOUNTER — Ambulatory Visit (INDEPENDENT_AMBULATORY_CARE_PROVIDER_SITE_OTHER): Payer: Medicaid Other | Admitting: Obstetrics and Gynecology

## 2021-11-07 ENCOUNTER — Other Ambulatory Visit (HOSPITAL_COMMUNITY)
Admission: RE | Admit: 2021-11-07 | Discharge: 2021-11-07 | Disposition: A | Discharge status: Home / Self Care | Payer: Medicaid Other | Source: Ambulatory Visit | Attending: Family Medicine | Admitting: Family Medicine

## 2021-11-07 ENCOUNTER — Encounter: Payer: Self-pay | Admitting: Obstetrics and Gynecology

## 2021-11-07 DIAGNOSIS — N939 Abnormal uterine and vaginal bleeding, unspecified: Secondary | ICD-10-CM

## 2021-11-07 LAB — POCT URINE PREGNANCY: Preg Test, Ur: NEGATIVE

## 2021-11-07 NOTE — Progress Notes (Signed)
36 y.o GYN presents for Endo Biopsy.  UPT is  Negative. C/o hip pain 5-8/10 x 3-4 months.

## 2021-11-07 NOTE — Progress Notes (Signed)
36 yo G3P3  svd x 2, c/s x 1  CC: thickened endometrial stripe Subjective:    Patient ID: Lacey Sanders, female    DOB: [birth year], 36 y.o.   MRN: [id number]  HPI 36 yo G3P3, SVD x 2 and c[facility] x [facility].  Pt had a [place] ultrasound with thickened endometrial stripe 1.43 cm done 11/22.  She had a subsequent u/s 7/23 which showed a 3 mm stripe.Pt noted irregular menses since January with 2-3 menses per month.  She only had one menses in August.  Usually the patient has menses lasting 6-7 days with occasional small clots.  Even though most recent ultrasound was improved, decision made for endometrial biopsy today.  ENDOMETRIAL BIOPSY      Lacey Sanders is a 36 y.o. [phone] here for endometrial biopsy.  The indications for endometrial biopsy were reviewed.  Risks of the biopsy including cramping, bleeding, infection, uterine perforation, inadequate specimen and need for additional procedures were discussed. The patient states she understands and agrees to undergo procedure today. Consent was signed. Time out was performed.   Indications: AUB Urine HCG: neg  A bivalve speculum was placed into the vagina and the cervix was easily visualized and was prepped with Betadine x2. A single-toothed tenaculum was placed on the anterior lip of the cervix to stabilize it. The 3 mm pipelle was introduced into the endometrial cavity without difficulty to a depth of 9 cm, and a moderate amount of tissue was obtained and sent to pathology. This was repeated for a total of 2 passes. The instruments were removed from the patient's vagina. Minimal bleeding from the cervix at the tenaculum was noted.   The patient tolerated the procedure well. Routine post-procedure instructions were given to the patient.    Will base further management on results of biopsy.    Review of Systems     Objective:   Physical Exam Vitals:   11/07/21 0956  BP: 117/72  Pulse: (!) 57          Assessment & Plan:   1. Abnormal uterine bleeding (AUB) Will review endometrial biopsy results and decide on treatment plan.  Briefly discussed OCP for cycle control, but pt was ambivalent regarding the medication, stating it had not controlled her cycles well previously.   - Surgical pathology( [facility]/ [facility]) - POCT urine pregnancy - Follicle stimulating hormone - Prolactin   Follow up in 3 weeks  Griffin Basil, MD Faculty Attending, [facility]

## 2021-11-08 LAB — FOLLICLE STIMULATING HORMONE: FSH: 2.9 m[IU]/mL

## 2021-11-08 LAB — PROLACTIN: Prolactin: 4 ng/mL — ABNORMAL LOW (ref 4.8–23.3)

## 2021-11-11 LAB — SURGICAL PATHOLOGY

## 2021-12-02 ENCOUNTER — Encounter: Payer: Self-pay | Admitting: Obstetrics and Gynecology

## 2021-12-02 ENCOUNTER — Other Ambulatory Visit: Payer: Self-pay

## 2021-12-02 ENCOUNTER — Ambulatory Visit (INDEPENDENT_AMBULATORY_CARE_PROVIDER_SITE_OTHER): Payer: Medicaid Other | Admitting: Obstetrics and Gynecology

## 2021-12-02 VITALS — BP 121/71 | HR 68 | Wt 189.2 lb

## 2021-12-02 DIAGNOSIS — N939 Abnormal uterine and vaginal bleeding, unspecified: Secondary | ICD-10-CM

## 2021-12-02 DIAGNOSIS — N926 Irregular menstruation, unspecified: Secondary | ICD-10-CM

## 2021-12-02 NOTE — Progress Notes (Signed)
  CC: lab follow up Subjective:    Patient ID: Lacey Sanders, female    DOB: [birth year], 36 y.o.   MRN: [id number]  HPI Pt seen for follow up from lab work and endometrial biopsy.  Endometrial biopsy was benign.  Labwork also normal.  Pt notes menses appear to be regular and last 6-7 days.  Discussed multiple treatment options, pt desires expectant management at this time.   Review of Systems     Objective:   Physical Exam Vitals:   12/02/21 1620  BP: 121/71  Pulse: 68         Assessment & Plan:   1. Irregular menstrual cycle   2. Abnormal uterine bleeding (AUB) Menses currently regular. Pt has PCP appointment in November where she may receive her pap smear.  If pap smear not administered, the patient is aware that the [facility] will be happy to provide that service.  I spent 20 minutes dedicated to the care of this patient including previsit review of records, face to face time with the patient discussing lab findings, treatment options and post visit testing.   Griffin Basil, MD Faculty Attending, [facility]

## 2021-12-26 ENCOUNTER — Ambulatory Visit (INDEPENDENT_AMBULATORY_CARE_PROVIDER_SITE_OTHER): Payer: Medicaid Other | Admitting: Nurse Practitioner

## 2021-12-26 ENCOUNTER — Encounter: Payer: Self-pay | Admitting: Nurse Practitioner

## 2021-12-26 VITALS — BP 110/65 | HR 72 | Temp 98.2°F | Ht 63.0 in | Wt 184.0 lb

## 2021-12-26 DIAGNOSIS — Z0000 Encounter for general adult medical examination without abnormal findings: Secondary | ICD-10-CM | POA: Diagnosis not present

## 2021-12-26 DIAGNOSIS — N926 Irregular menstruation, unspecified: Secondary | ICD-10-CM | POA: Diagnosis not present

## 2021-12-26 DIAGNOSIS — Z13220 Encounter for screening for lipoid disorders: Secondary | ICD-10-CM | POA: Diagnosis not present

## 2021-12-26 NOTE — Patient Instructions (Signed)
1. Routine general medical examination at health care facility  - CBC with Differential/Platelet - Comprehensive metabolic panel - IGP,rfx cobasHPV ASCU - Lipid panel  2. Irregular menstrual cycle  - CBC with Differential/Platelet  4. Lipid screening  - Lipid panel  Follow up:  Follow up in 1 year

## 2021-12-26 NOTE — Assessment & Plan Note (Signed)
-   CBC with Differential/Platelet - Comprehensive metabolic panel - IGP,rfx cobasHPV ASCU - Lipid panel  2. Irregular menstrual cycle  - CBC with Differential/Platelet  4. Lipid screening  - Lipid panel  Follow up:  Follow up in 1 year

## 2021-12-26 NOTE — Progress Notes (Signed)
@Patient  ID: , female    DOB: 1985/06/04, 36 y.o.   MRN: [id number]  Chief Complaint  Patient presents with   Annual Exam    Referring provider: [id number], MD   HPI  Patient presents today for a physical and Pap smear.  Overall she has been doing well.  She was having hip pain at her last visit here but states that that has improved now.  She is in with an OB/GYN but states that her OB/GYN told her to come here for her Pap smear. Denies f/c/s, n/v/d, hemoptysis, PND, leg swelling Denies chest pain or edema        No Known Allergies  Immunization History  Administered Date(s) Administered   Influenza Split 11/05/2012   Influenza,inj,Quad PF,6+ Mos 12/05/2013, 05/29/2017, 10/28/2017   Tdap 09/04/2015, 09/01/2017    Past Medical History:  Diagnosis Date   Medical history non-contributory     Tobacco History: Social History   Tobacco Use  Smoking Status Never  Smokeless Tobacco Never   Counseling given: Not Answered   Outpatient Encounter Medications as of 12/26/2021  Medication Sig   acetaminophen (TYLENOL) 325 MG tablet Take 2 tablets (650 mg total) by mouth every 4 (four) hours as needed (for pain scale < 4).   ondansetron (ZOFRAN-ODT) 4 MG disintegrating tablet 4mg  ODT q4 hours prn nausea/vomit   Prenatal Vit-Fe Fumarate-FA (PRENATAL MULTIVITAMIN) TABS tablet Take 1 tablet by mouth daily at 12 noon.   tiZANidine (ZANAFLEX) 4 MG tablet Take 1 tablet (4 mg total) by mouth every 6 (six) hours as needed for muscle spasms. (Patient not taking: Reported on 12/26/2021)   [DISCONTINUED] ibuprofen (ADVIL) 600 MG tablet Take 1 tablet (600 mg total) by mouth every 8 (eight) hours as needed.   No facility-administered encounter medications on file as of 12/26/2021.     Review of Systems  Review of Systems  Constitutional: Negative.   HENT: Negative.    Cardiovascular: Negative.   Gastrointestinal: Negative.   Allergic/Immunologic:  Negative.   Neurological: Negative.   Psychiatric/Behavioral: Negative.         Physical Exam  BP 110/65   Pulse 72   Temp 98.2 F (36.8 C)   Ht 5\' 3"  (1.6 m)   Wt 184 lb (83.5 kg)   LMP 12/19/2021   SpO2 100%   BMI 32.59 kg/m   Wt Readings from Last 5 Encounters:  12/26/21 184 lb (83.5 kg)  12/02/21 189 lb 3.2 oz (85.8 kg)  11/07/21 184 lb (83.5 kg)  09/25/21 188 lb (85.3 kg)  08/21/21 190 lb (86.2 kg)     Physical Exam Vitals and nursing note reviewed. Exam conducted with a chaperone present.  Constitutional:      General: She is not in acute distress.    Appearance: She is well-developed.  Cardiovascular:     Rate and Rhythm: Normal rate and regular rhythm.  Pulmonary:     Effort: Pulmonary effort is normal.     Breath sounds: Normal breath sounds.  Genitourinary:    General: Normal vulva.     Vagina: Normal.     Cervix: Friability present.     Uterus: Normal.   Neurological:     Mental Status: She is alert and oriented to person, place, and time.      Lab Results:  CBC    Component Value Date/Time   WBC 7.0 09/25/2021 0958   WBC 7.1 08/21/2021 1719   RBC 4.69 09/25/2021 0958   RBC  4.44 08/21/2021 1719   HGB 14.3 09/25/2021 0958   HCT 43.1 09/25/2021 0958   PLT 286 09/25/2021 0958   MCV 92 09/25/2021 0958   MCH 30.5 09/25/2021 0958   MCH 30.6 08/21/2021 1719   MCHC 33.2 09/25/2021 0958   MCHC 34.4 08/21/2021 1719   RDW 12.6 09/25/2021 0958   LYMPHSABS 4.4 (H) 03/08/2021 0504   LYMPHSABS 2.1 05/01/2017 1046   MONOABS 0.7 03/08/2021 0504   EOSABS 0.2 03/08/2021 0504   EOSABS 0.3 05/01/2017 1046   BASOSABS 0.1 03/08/2021 0504   BASOSABS 0.0 05/01/2017 1046    BMET    Component Value Date/Time   NA 140 09/25/2021 0958   K 4.7 09/25/2021 0958   CL 103 09/25/2021 0958   CO2 22 09/25/2021 0958   GLUCOSE 96 09/25/2021 0958   GLUCOSE 93 08/21/2021 1719   GLUCOSE 83 09/25/2015 0856   BUN 8 09/25/2021 0958   CREATININE 0.66 09/25/2021  0958   CREATININE 0.57 06/09/2013 1301   CALCIUM 9.8 09/25/2021 0958   GFRNONAA >60 08/21/2021 1719   GFRNONAA >89 06/09/2013 1301   GFRAA >89 06/09/2013 1301     Assessment & Plan:   Routine general medical examination at health care facility - CBC with Differential/Platelet - Comprehensive metabolic panel - IGP,rfx cobasHPV ASCU - Lipid panel  2. Irregular menstrual cycle  - CBC with Differential/Platelet  4. Lipid screening  - Lipid panel  Follow up:  Follow up in 1 year     Ivonne Andrew, NP 12/26/2021

## 2021-12-27 LAB — COMPREHENSIVE METABOLIC PANEL
ALT: 21 IU/L (ref 0–32)
AST: 22 IU/L (ref 0–40)
Albumin/Globulin Ratio: 1.6 (ref 1.2–2.2)
Albumin: 4.6 g/dL (ref 3.9–4.9)
Alkaline Phosphatase: 61 IU/L (ref 44–121)
BUN/Creatinine Ratio: 10 (ref 9–23)
BUN: 7 mg/dL (ref 6–20)
Bilirubin Total: 0.6 mg/dL (ref 0.0–1.2)
CO2: 24 mmol/L (ref 20–29)
Calcium: 9.7 mg/dL (ref 8.7–10.2)
Chloride: 102 mmol/L (ref 96–106)
Creatinine, Ser: 0.7 mg/dL (ref 0.57–1.00)
Globulin, Total: 2.9 g/dL (ref 1.5–4.5)
Glucose: 91 mg/dL (ref 70–99)
Potassium: 4.1 mmol/L (ref 3.5–5.2)
Sodium: 140 mmol/L (ref 134–144)
Total Protein: 7.5 g/dL (ref 6.0–8.5)
eGFR: 116 mL/min/{1.73_m2} (ref >59)

## 2021-12-27 LAB — CBC WITH DIFFERENTIAL/PLATELET
Basophils Absolute: 0 10*3/uL (ref 0.0–0.2)
Basos: 1 %
EOS (ABSOLUTE): 0.1 10*3/uL (ref 0.0–0.4)
Eos: 2 %
Hematocrit: 40.4 % (ref 34.0–46.6)
Hemoglobin: 13.7 g/dL (ref 11.1–15.9)
Immature Grans (Abs): 0 10*3/uL (ref 0.0–0.1)
Immature Granulocytes: 0 %
Lymphocytes Absolute: 1.6 10*3/uL (ref 0.7–3.1)
Lymphs: 28 %
MCH: 30.6 pg (ref 26.6–33.0)
MCHC: 33.9 g/dL (ref 31.5–35.7)
MCV: 90 fL (ref 79–97)
Monocytes Absolute: 0.5 10*3/uL (ref 0.1–0.9)
Monocytes: 8 %
Neutrophils Absolute: 3.5 10*3/uL (ref 1.4–7.0)
Neutrophils: 61 %
Platelets: 309 10*3/uL (ref 150–450)
RBC: 4.47 x10E6/uL (ref 3.77–5.28)
RDW: 12.6 % (ref 11.7–15.4)
WBC: 5.8 10*3/uL (ref 3.4–10.8)

## 2021-12-27 LAB — LIPID PANEL
Chol/HDL Ratio: 4.8 ratio — ABNORMAL HIGH (ref 0.0–4.4)
Cholesterol, Total: 228 mg/dL — ABNORMAL HIGH (ref 100–199)
HDL: 48 mg/dL (ref >39)
LDL Chol Calc (NIH): 157 mg/dL — ABNORMAL HIGH (ref 0–99)
Triglycerides: 126 mg/dL (ref 0–149)
VLDL Cholesterol Cal: 23 mg/dL (ref 5–40)

## 2021-12-31 LAB — IGP,RFX COBASHPV ASCU

## 2022-01-01 ENCOUNTER — Telehealth: Payer: Self-pay | Admitting: Family Medicine

## 2022-01-01 NOTE — Telephone Encounter (Signed)
Pt called to get her lab results   You will need an interpreter

## 2022-01-01 NOTE — Telephone Encounter (Signed)
Pt was advised [facility] 

## 2022-02-11 DIAGNOSIS — R058 Other specified cough: Secondary | ICD-10-CM | POA: Diagnosis not present

## 2022-02-11 DIAGNOSIS — R1013 Epigastric pain: Secondary | ICD-10-CM | POA: Diagnosis not present

## 2022-03-10 ENCOUNTER — Encounter: Payer: Self-pay | Admitting: Nurse Practitioner

## 2022-03-10 ENCOUNTER — Ambulatory Visit: Payer: Medicaid Other | Admitting: Nurse Practitioner

## 2022-03-10 VITALS — BP 125/84 | HR 91 | Temp 98.4°F | Ht 62.0 in | Wt 192.0 lb

## 2022-03-10 DIAGNOSIS — J40 Bronchitis, not specified as acute or chronic: Secondary | ICD-10-CM | POA: Diagnosis not present

## 2022-03-10 DIAGNOSIS — E559 Vitamin D deficiency, unspecified: Secondary | ICD-10-CM | POA: Diagnosis not present

## 2022-03-10 DIAGNOSIS — N898 Other specified noninflammatory disorders of vagina: Secondary | ICD-10-CM

## 2022-03-10 DIAGNOSIS — Z0000 Encounter for general adult medical examination without abnormal findings: Secondary | ICD-10-CM

## 2022-03-10 MED ORDER — ALBUTEROL SULFATE HFA 108 (90 BASE) MCG/ACT IN AERS: 2.0000 | INHALATION_SPRAY | Freq: Four times a day (QID) | RESPIRATORY_TRACT | 0 refills | Status: DC | PRN

## 2022-03-10 NOTE — Progress Notes (Unsigned)
@Patient  ID: Lacey Sanders, female    DOB: 07/16/1985, 37 y.o.   MRN: [id number]  Chief Complaint  Patient presents with   Vaginal Itching    Some discharge, itching, a little smell x 2-3 days She has pain in her bones. Where she sleeps against something like where her bones touch her husband or the bed. She states that she feels like her ribs get stuck sometimes when bending over. Only on her right side.    Cough    Was sick in November and has had a cough off and on since. She was given tessalon pearls and the inhaler was not approved by inhaler. States that she has phlegm in her throat. When she gets too hot she cannot breathe and she feels weird.     Referring provider: Fenton Foy, NP   HPI  Patient presents today for acute visit.  She states that she has been coughing since November.  She was prescribed inhaler but was not able to get it due to cost.  We will try to reorder this today.  Patient does have vaginal irritation.  This started couple days ago.  She also complains of bone pain.  We will order some labs today. Denies f/c/s, n/v/d, hemoptysis, PND, leg swelling Denies chest pain or edema      No Known Allergies  Immunization History  Administered Date(s) Administered   Influenza Split 11/05/2012   Influenza,inj,Quad PF,6+ Mos 12/05/2013, 05/29/2017, 10/28/2017   Tdap 09/04/2015, 09/01/2017    Past Medical History:  Diagnosis Date   Medical history non-contributory     Tobacco History: Social History   Tobacco Use  Smoking Status Never  Smokeless Tobacco Never   Counseling given: Not Answered   Outpatient Encounter Medications as of 03/10/2022  Medication Sig   albuterol (VENTOLIN HFA) 108 (90 Base) MCG/ACT inhaler Inhale 2 puffs into the lungs every 6 (six) hours as needed for wheezing or shortness of breath.   acetaminophen (TYLENOL) 325 MG tablet Take 2 tablets (650 mg total) by mouth every 4 (four) hours as needed (for pain scale  < 4). (Patient not taking: Reported on 03/10/2022)   ondansetron (ZOFRAN-ODT) 4 MG disintegrating tablet 4mg  ODT q4 hours prn nausea/vomit (Patient not taking: Reported on 03/10/2022)   Prenatal Vit-Fe Fumarate-FA (PRENATAL MULTIVITAMIN) TABS tablet Take 1 tablet by mouth daily at 12 noon. (Patient not taking: Reported on 03/10/2022)   tiZANidine (ZANAFLEX) 4 MG tablet Take 1 tablet (4 mg total) by mouth every 6 (six) hours as needed for muscle spasms. (Patient not taking: Reported on 03/10/2022)   No facility-administered encounter medications on file as of 03/10/2022.     Review of Systems  Review of Systems  Constitutional: Negative.   HENT: Negative.    Respiratory:  Positive for cough.   Cardiovascular: Negative.   Gastrointestinal: Negative.   Genitourinary:  Positive for vaginal discharge.  Allergic/Immunologic: Negative.   Neurological: Negative.   Psychiatric/Behavioral: Negative.         Physical Exam  BP 125/84   Pulse 91   Temp 98.4 F (36.9 C) (Temporal)   Ht 5\' 2"  (1.575 m)   Wt 192 lb (87.1 kg)   SpO2 99%   BMI 35.12 kg/m   Wt Readings from Last 5 Encounters:  03/10/22 192 lb (87.1 kg)  12/26/21 184 lb (83.5 kg)  12/02/21 189 lb 3.2 oz (85.8 kg)  11/07/21 184 lb (83.5 kg)  09/25/21 188 lb (85.3 kg)  Physical Exam Vitals and nursing note reviewed.  Constitutional:      General: She is not in acute distress.    Appearance: She is well-developed.  Cardiovascular:     Rate and Rhythm: Normal rate and regular rhythm.  Pulmonary:     Effort: Pulmonary effort is normal.     Breath sounds: Normal breath sounds.  Neurological:     Mental Status: She is alert and oriented to person, place, and time.      Lab Results:  CBC    Component Value Date/Time   WBC 9.5 03/10/2022 1431   WBC 7.1 08/21/2021 1719   RBC 4.68 03/10/2022 1431   RBC 4.44 08/21/2021 1719   HGB 13.9 03/10/2022 1431   HCT 42.1 03/10/2022 1431   PLT 335 03/10/2022 1431   MCV  90 03/10/2022 1431   MCH 29.7 03/10/2022 1431   MCH 30.6 08/21/2021 1719   MCHC 33.0 03/10/2022 1431   MCHC 34.4 08/21/2021 1719   RDW 12.3 03/10/2022 1431   LYMPHSABS 1.6 12/26/2021 0912   MONOABS 0.7 03/08/2021 0504   EOSABS 0.1 12/26/2021 0912   BASOSABS 0.0 12/26/2021 0912    BMET    Component Value Date/Time   NA 141 03/10/2022 1431   K 4.2 03/10/2022 1431   CL 102 03/10/2022 1431   CO2 23 03/10/2022 1431   GLUCOSE 90 03/10/2022 1431   GLUCOSE 93 08/21/2021 1719   GLUCOSE 83 09/25/2015 0856   BUN 11 03/10/2022 1431   CREATININE 0.55 (L) 03/10/2022 1431   CREATININE 0.57 06/09/2013 1301   CALCIUM 9.8 03/10/2022 1431   GFRNONAA >60 08/21/2021 1719   GFRNONAA >89 06/09/2013 1301   GFRAA >89 06/09/2013 1301      Assessment & Plan:   Bronchitis - albuterol (VENTOLIN HFA) 108 (90 Base) MCG/ACT inhaler; Inhale 2 puffs into the lungs every 6 (six) hours as needed for wheezing or shortness of breath.  Dispense: 8 g; Refill: 0  2. Vaginal irritation  - NuSwab Vaginitis Plus (VG+)  3. Vitamin D deficiency  - Vitamin D, 25-hydroxy  4. Routine health maintenance  - CBC - Comprehensive metabolic panel   Follow up:  Follow up in 6 months or sooner if needed     Fenton Foy, NP 03/12/2022

## 2022-03-10 NOTE — Patient Instructions (Addendum)
1. Bronchitis  - albuterol (VENTOLIN HFA) 108 (90 Base) MCG/ACT inhaler; Inhale 2 puffs into the lungs every 6 (six) hours as needed for wheezing or shortness of breath.  Dispense: 8 g; Refill: 0  2. Vaginal irritation  - NuSwab Vaginitis Plus (VG+)  3. Vitamin D deficiency  - Vitamin D, 25-hydroxy  4. Routine health maintenance  - CBC - Comprehensive metabolic panel   Follow up:  Follow up in 6 months or sooner if needed

## 2022-03-11 LAB — COMPREHENSIVE METABOLIC PANEL
ALT: 15 IU/L (ref 0–32)
AST: 12 IU/L (ref 0–40)
Albumin/Globulin Ratio: 1.8 (ref 1.2–2.2)
Albumin: 4.7 g/dL (ref 3.9–4.9)
Alkaline Phosphatase: 75 IU/L (ref 44–121)
BUN/Creatinine Ratio: 20 (ref 9–23)
BUN: 11 mg/dL (ref 6–20)
Bilirubin Total: <0.2 mg/dL (ref 0.0–1.2)
CO2: 23 mmol/L (ref 20–29)
Calcium: 9.8 mg/dL (ref 8.7–10.2)
Chloride: 102 mmol/L (ref 96–106)
Creatinine, Ser: 0.55 mg/dL — ABNORMAL LOW (ref 0.57–1.00)
Globulin, Total: 2.6 g/dL (ref 1.5–4.5)
Glucose: 90 mg/dL (ref 70–99)
Potassium: 4.2 mmol/L (ref 3.5–5.2)
Sodium: 141 mmol/L (ref 134–144)
Total Protein: 7.3 g/dL (ref 6.0–8.5)
eGFR: 123 mL/min/{1.73_m2} (ref >59)

## 2022-03-11 LAB — CBC
Hematocrit: 42.1 % (ref 34.0–46.6)
Hemoglobin: 13.9 g/dL (ref 11.1–15.9)
MCH: 29.7 pg (ref 26.6–33.0)
MCHC: 33 g/dL (ref 31.5–35.7)
MCV: 90 fL (ref 79–97)
Platelets: 335 10*3/uL (ref 150–450)
RBC: 4.68 x10E6/uL (ref 3.77–5.28)
RDW: 12.3 % (ref 11.7–15.4)
WBC: 9.5 10*3/uL (ref 3.4–10.8)

## 2022-03-11 LAB — VITAMIN D 25 HYDROXY (VIT D DEFICIENCY, FRACTURES): Vit D, 25-Hydroxy: 10.9 ng/mL — ABNORMAL LOW (ref 30.0–100.0)

## 2022-03-12 ENCOUNTER — Telehealth: Payer: Self-pay | Admitting: Nurse Practitioner

## 2022-03-12 ENCOUNTER — Encounter: Payer: Self-pay | Admitting: Nurse Practitioner

## 2022-03-12 ENCOUNTER — Other Ambulatory Visit: Payer: Self-pay | Admitting: Nurse Practitioner

## 2022-03-12 DIAGNOSIS — J40 Bronchitis, not specified as acute or chronic: Secondary | ICD-10-CM | POA: Insufficient documentation

## 2022-03-12 LAB — NUSWAB VAGINITIS PLUS (VG+)
Candida albicans, NAA: POSITIVE — AB
Candida glabrata, NAA: NEGATIVE
Chlamydia trachomatis, NAA: NEGATIVE
Neisseria gonorrhoeae, NAA: NEGATIVE
Trich vag by NAA: NEGATIVE

## 2022-03-12 MED ORDER — VITAMIN D (ERGOCALCIFEROL) 1.25 MG (50000 UNIT) PO CAPS: 50000.0000 [IU] | ORAL_CAPSULE | ORAL | 2 refills | Status: DC

## 2022-03-12 NOTE — Assessment & Plan Note (Signed)
-  albuterol (VENTOLIN HFA) 108 (90 Base) MCG/ACT inhaler; Inhale 2 puffs into the lungs every 6 (six) hours as needed for wheezing or shortness of breath.  Dispense: 8 g; Refill: 0  2. Vaginal irritation  - NuSwab Vaginitis Plus (VG+)  3. Vitamin D deficiency  - Vitamin D, 25-hydroxy  4. Routine health maintenance  - CBC - Comprehensive metabolic panel   Follow up:  Follow up in 6 months or sooner if needed

## 2022-03-12 NOTE — Telephone Encounter (Signed)
Pt called and asking for results of test she done in office   You will need an interpreter

## 2022-03-13 ENCOUNTER — Other Ambulatory Visit: Payer: Self-pay | Admitting: Nurse Practitioner

## 2022-03-13 MED ORDER — FLUCONAZOLE 150 MG PO TABS: 150.0000 mg | ORAL_TABLET | Freq: Every day | ORAL | 0 refills | Status: DC

## 2022-05-22 ENCOUNTER — Encounter (HOSPITAL_BASED_OUTPATIENT_CLINIC_OR_DEPARTMENT_OTHER): Payer: Medicaid Other | Admitting: Obstetrics & Gynecology

## 2022-06-16 ENCOUNTER — Other Ambulatory Visit: Payer: Self-pay | Admitting: Nurse Practitioner

## 2022-06-16 NOTE — Telephone Encounter (Signed)
Please advise K[facility] 

## 2022-06-26 ENCOUNTER — Ambulatory Visit: Payer: Medicaid Other | Admitting: Nurse Practitioner

## 2022-06-26 ENCOUNTER — Encounter: Payer: Self-pay | Admitting: Nurse Practitioner

## 2022-06-26 VITALS — BP 115/67 | HR 83 | Temp 97.4°F | Ht 62.0 in | Wt 188.6 lb

## 2022-06-26 DIAGNOSIS — E559 Vitamin D deficiency, unspecified: Secondary | ICD-10-CM

## 2022-06-26 DIAGNOSIS — Z0000 Encounter for general adult medical examination without abnormal findings: Secondary | ICD-10-CM

## 2022-06-26 NOTE — Progress Notes (Signed)
@Patient  ID: Lacey Sanders, female    DOB: 1985/06/09, 37 y.o.   MRN: [id number]  Chief Complaint  Patient presents with   Follow-up    Referring provider: Dessa Phi, MD   HPI  37 year old female with history of bronchitis, GERD, IBS, abnormal uterine bleeding.  Patient presents today for follow-up visit on vitamin D deficiency.  Patient states overall she is doing well.  She has ran out of her vitamin D supplement and is currently taking a multivitamin and calcium.  She states that she has been feeling well and her periods have been regular.  She did have past medical history of irregular periods but has been evaluated by OB/GYN for this.  Denies f/c/s, n/v/d, hemoptysis, PND, leg swelling Denies chest pain or edema     Allergies  Allergen Reactions   Bee Pollen     Immunization History  Administered Date(s) Administered   Influenza Split 11/05/2012   Influenza,inj,Quad PF,6+ Mos 12/05/2013, 05/29/2017, 10/28/2017   Tdap 09/04/2015, 09/01/2017    Past Medical History:  Diagnosis Date   Medical history non-contributory     Tobacco History: Social History   Tobacco Use  Smoking Status Never  Smokeless Tobacco Never   Counseling given: Not Answered   Outpatient Encounter Medications as of 06/26/2022  Medication Sig   calcium carbonate (OS-CAL) 1250 (500 Ca) MG chewable tablet Chew 1 tablet by mouth daily.   cetirizine-pseudoephedrine (ZYRTEC-D) 5-120 MG tablet Take 1 tablet by mouth 2 (two) times daily.   Multiple Vitamin (MULTIVITAMIN WITH MINERALS) TABS tablet Take 1 tablet by mouth daily.   Vitamin D, Ergocalciferol, (DRISDOL) 1.25 MG (50000 UNIT) CAPS capsule TAKE 1 CAPSULE BY MOUTH EVERY 7 DAYS   albuterol (VENTOLIN HFA) 108 (90 Base) MCG/ACT inhaler Inhale 2 puffs into the lungs every 6 (six) hours as needed for wheezing or shortness of breath. (Patient not taking: Reported on 06/26/2022)   [DISCONTINUED] acetaminophen (TYLENOL) 325 MG  tablet Take 2 tablets (650 mg total) by mouth every 4 (four) hours as needed (for pain scale < 4). (Patient not taking: Reported on 03/10/2022)   [DISCONTINUED] fluconazole (DIFLUCAN) 150 MG tablet Take 1 tablet (150 mg total) by mouth daily. (Patient not taking: Reported on 06/26/2022)   [DISCONTINUED] ondansetron (ZOFRAN-ODT) 4 MG disintegrating tablet 4mg  ODT q4 hours prn nausea/vomit (Patient not taking: Reported on 03/10/2022)   [DISCONTINUED] Prenatal Vit-Fe Fumarate-FA (PRENATAL MULTIVITAMIN) TABS tablet Take 1 tablet by mouth daily at 12 noon. (Patient not taking: Reported on 03/10/2022)   [DISCONTINUED] tiZANidine (ZANAFLEX) 4 MG tablet Take 1 tablet (4 mg total) by mouth every 6 (six) hours as needed for muscle spasms. (Patient not taking: Reported on 03/10/2022)   No facility-administered encounter medications on file as of 06/26/2022.     Review of Systems  Review of Systems  Constitutional: Negative.   HENT: Negative.    Cardiovascular: Negative.   Gastrointestinal: Negative.   Allergic/Immunologic: Negative.   Neurological: Negative.   Psychiatric/Behavioral: Negative.         Physical Exam  BP 115/67   Pulse 83   Temp (!) 97.4 F (36.3 C)   Ht 5\' 2"  (1.575 m)   Wt 188 lb 9.6 oz (85.5 kg)   LMP 06/02/2022 (Approximate)   SpO2 99%   BMI 34.50 kg/m   Wt Readings from Last 5 Encounters:  06/26/22 188 lb 9.6 oz (85.5 kg)  03/10/22 192 lb (87.1 kg)  12/26/21 184 lb (83.5 kg)  12/02/21 189 lb 3.2 oz (  85.8 kg)  11/07/21 184 lb (83.5 kg)     Physical Exam Vitals and nursing note reviewed.  Constitutional:      General: She is not in acute distress.    Appearance: She is well-developed.  Cardiovascular:     Rate and Rhythm: Normal rate and regular rhythm.  Pulmonary:     Effort: Pulmonary effort is normal.     Breath sounds: Normal breath sounds.  Neurological:     Mental Status: She is alert and oriented to person, place, and time.      Lab  Results:  CBC    Component Value Date/Time   WBC 9.5 03/10/2022 1431   WBC 7.1 08/21/2021 1719   RBC 4.68 03/10/2022 1431   RBC 4.44 08/21/2021 1719   HGB 13.9 03/10/2022 1431   HCT 42.1 03/10/2022 1431   PLT 335 03/10/2022 1431   MCV 90 03/10/2022 1431   MCH 29.7 03/10/2022 1431   MCH 30.6 08/21/2021 1719   MCHC 33.0 03/10/2022 1431   MCHC 34.4 08/21/2021 1719   RDW 12.3 03/10/2022 1431   LYMPHSABS 1.6 12/26/2021 0912   MONOABS 0.7 03/08/2021 0504   EOSABS 0.1 12/26/2021 0912   BASOSABS 0.0 12/26/2021 0912    BMET    Component Value Date/Time   NA 141 03/10/2022 1431   K 4.2 03/10/2022 1431   CL 102 03/10/2022 1431   CO2 23 03/10/2022 1431   GLUCOSE 90 03/10/2022 1431   GLUCOSE 93 08/21/2021 1719   GLUCOSE 83 09/25/2015 0856   BUN 11 03/10/2022 1431   CREATININE 0.55 (L) 03/10/2022 1431   CREATININE 0.57 06/09/2013 1301   CALCIUM 9.8 03/10/2022 1431   GFRNONAA >60 08/21/2021 1719   GFRNONAA >89 06/09/2013 1301   GFRAA >89 06/09/2013 1301      Assessment & Plan:   Vitamin D deficiency - Vitamin D, 25-hydroxy   2. Routine health maintenance  - CBC - Comprehensive metabolic panel   Follow up:  Follow up in 1 year or sooner if needed     Ivonne Andrew, NP 06/26/2022

## 2022-06-26 NOTE — Assessment & Plan Note (Signed)
-   Vitamin D, 25-hydroxy   2. Routine health maintenance  - CBC - Comprehensive metabolic panel   Follow up:  Follow up in 1 year or sooner if needed

## 2022-06-26 NOTE — Patient Instructions (Addendum)
1. Vitamin D deficiency  - Vitamin D, 25-hydroxy   2. Routine health maintenance  - CBC - Comprehensive metabolic panel   Follow up:  Follow up in 1 year or sooner if needed

## 2022-06-27 LAB — COMPREHENSIVE METABOLIC PANEL
ALT: 23 IU/L (ref 0–32)
AST: 22 IU/L (ref 0–40)
Albumin/Globulin Ratio: 1.8 (ref 1.2–2.2)
Albumin: 4.5 g/dL (ref 3.9–4.9)
Alkaline Phosphatase: 67 IU/L (ref 44–121)
BUN/Creatinine Ratio: 15 (ref 9–23)
BUN: 11 mg/dL (ref 6–20)
Bilirubin Total: 0.3 mg/dL (ref 0.0–1.2)
CO2: 24 mmol/L (ref 20–29)
Calcium: 10.3 mg/dL — ABNORMAL HIGH (ref 8.7–10.2)
Chloride: 102 mmol/L (ref 96–106)
Creatinine, Ser: 0.75 mg/dL (ref 0.57–1.00)
Globulin, Total: 2.5 g/dL (ref 1.5–4.5)
Glucose: 108 mg/dL — ABNORMAL HIGH (ref 70–99)
Potassium: 4.7 mmol/L (ref 3.5–5.2)
Sodium: 140 mmol/L (ref 134–144)
Total Protein: 7 g/dL (ref 6.0–8.5)
eGFR: 106 mL/min/{1.73_m2} (ref >59)

## 2022-06-27 LAB — CBC
Hematocrit: 43 % (ref 34.0–46.6)
Hemoglobin: 14.3 g/dL (ref 11.1–15.9)
MCH: 30.2 pg (ref 26.6–33.0)
MCHC: 33.3 g/dL (ref 31.5–35.7)
MCV: 91 fL (ref 79–97)
Platelets: 331 10*3/uL (ref 150–450)
RBC: 4.73 x10E6/uL (ref 3.77–5.28)
RDW: 13.1 % (ref 11.7–15.4)
WBC: 7 10*3/uL (ref 3.4–10.8)

## 2022-06-27 LAB — VITAMIN D 25 HYDROXY (VIT D DEFICIENCY, FRACTURES): Vit D, 25-Hydroxy: 43 ng/mL (ref 30.0–100.0)

## 2022-07-10 ENCOUNTER — Other Ambulatory Visit (HOSPITAL_COMMUNITY)
Admission: RE | Admit: 2022-07-10 | Discharge: 2022-07-10 | Disposition: A | Discharge status: Home / Self Care | Payer: Medicaid Other | Source: Ambulatory Visit | Attending: Obstetrics & Gynecology | Admitting: Obstetrics & Gynecology

## 2022-07-10 ENCOUNTER — Ambulatory Visit (HOSPITAL_BASED_OUTPATIENT_CLINIC_OR_DEPARTMENT_OTHER): Payer: Medicaid Other | Admitting: Obstetrics & Gynecology

## 2022-07-10 ENCOUNTER — Encounter (HOSPITAL_BASED_OUTPATIENT_CLINIC_OR_DEPARTMENT_OTHER): Payer: Self-pay | Admitting: Obstetrics & Gynecology

## 2022-07-10 VITALS — BP 119/65 | HR 90 | Ht 62.5 in | Wt 190.8 lb

## 2022-07-10 DIAGNOSIS — N941 Unspecified dyspareunia: Secondary | ICD-10-CM | POA: Diagnosis not present

## 2022-07-10 DIAGNOSIS — M6289 Other specified disorders of muscle: Secondary | ICD-10-CM | POA: Diagnosis not present

## 2022-07-10 DIAGNOSIS — Z124 Encounter for screening for malignant neoplasm of cervix: Secondary | ICD-10-CM

## 2022-07-10 DIAGNOSIS — N898 Other specified noninflammatory disorders of vagina: Secondary | ICD-10-CM

## 2022-07-10 NOTE — Progress Notes (Signed)
GYNECOLOGY  VISIT  CC:   vaginal discharge, intermittent pelvic pain, dysmenorrhea  HPI: 37 y.o. [id number] Married White or Caucasian female here for complaint of concerns about vaginal discharge and also h/o ovarian cyst that was seen in September.  Reports also that she has some mild pain with intercourse that has been intermittent.  Would like to know if needs any follow up.  Reviewed images personally and shared with pt.  Ovarian cyst measuring 2.9cm but is simple and avascular.  No follow up recommended.  If she stands for too long, will have some pain in her lower pelvis.  Has friend who went to PT and this helped.  Wondering if appropriate.   Reports cycles are generally normal especially during the past year.  Last month, the cycle came about a week.  Bleeding was just with some spotting but lasted about 10 days.  Typically, cycles last 5 days.    Vaginal discharge does not have any odor.  She did use OTC product and this helped but symptoms are still present.  No urinary symptoms.  Has normal bowel movements.   Pap smear results reviewed.  Normal but no HR HPV obtained.  Will update today.  Ovidio Kin, spanish interpreter was present for entire appointment.  Past Medical History:  Diagnosis Date   Medical history non-contributory     MEDS:   Current Outpatient Medications on File Prior to Visit  Medication Sig Dispense Refill   albuterol (VENTOLIN HFA) 108 (90 Base) MCG/ACT inhaler Inhale 2 puffs into the lungs every 6 (six) hours as needed for wheezing or shortness of breath. 8 g 0   calcium carbonate (OS-CAL) 1250 (500 Ca) MG chewable tablet Chew 1 tablet by mouth daily.     cetirizine-pseudoephedrine (ZYRTEC-D) 5-120 MG tablet Take 1 tablet by mouth 2 (two) times daily.     Multiple Vitamin (MULTIVITAMIN WITH MINERALS) TABS tablet Take 1 tablet by mouth daily.     Vitamin D, Ergocalciferol, (DRISDOL) 1.25 MG (50000 UNIT) CAPS capsule TAKE 1 CAPSULE BY MOUTH EVERY 7 DAYS  5 capsule 2   No current facility-administered medications on file prior to visit.    ALLERGIES: Bee pollen  SH:  married, non smoker  Review of Systems  Constitutional: Negative.   Genitourinary:        Vaginal discharge Pelvic discomfort    PHYSICAL EXAMINATION:    BP 119/65 (BP Location: Left Arm, Patient Position: Sitting, Cuff Size: Large)   Pulse 90   Ht 5' 2.5" (1.588 m)   Wt 190 lb 12.8 oz (86.5 kg)   LMP 06/02/2022 (Approximate) Comment: Irregular  BMI 34.34 kg/m     General appearance: alert, cooperative and appears stated age Abdomen: soft, non-tender; bowel sounds normal; no masses,  no organomegaly Lymph:  no inguinal LAD noted  Pelvic: External genitalia:  no lesions              Urethra:  normal appearing urethra with no masses, tenderness or lesions              Bartholins and Skenes: normal                 Vagina: normal appearing vagina with normal color and discharge, no lesions              Cervix: no lesions              Bimanual Exam:  Uterus:  normal size, contour, position, consistency, mobility, non-tender  Adnexa: no mass, fullness, tenderness              Mild tenderness to pelvic floor palpation  Chaperone, Ina Homes, CMA, was present for exam.  Assessment/Plan: 1. Vaginal discharge - Cervicovaginal ancillary only.  Testing for yeast, BV, and STIs performed today.  2. Cervical cancer screening - HR HPV obdated today - Cervicovaginal ancillary only( [facility])  3. Pelvic floor dysfunction - will refer to PT - Ambulatory referral to Physical Therapy  4.  Dyspareunia

## 2022-07-11 LAB — CERVICOVAGINAL ANCILLARY ONLY
Bacterial Vaginitis (gardnerella): NEGATIVE
Candida Glabrata: NEGATIVE
Candida Vaginitis: POSITIVE — AB
Chlamydia: NEGATIVE
Comment: NEGATIVE
Comment: NEGATIVE
Comment: NEGATIVE
Comment: NEGATIVE
Comment: NEGATIVE
Comment: NEGATIVE
Comment: NORMAL
High risk HPV: NEGATIVE
Neisseria Gonorrhea: NEGATIVE
Trichomonas: NEGATIVE

## 2022-07-12 MED ORDER — TERCONAZOLE 0.4 % VA CREA
1.0000 | TOPICAL_CREAM | Freq: Every day | VAGINAL | 0 refills | Status: DC
Start: 2022-07-12 — End: 2022-08-11

## 2022-07-30 ENCOUNTER — Encounter: Payer: Self-pay | Admitting: Nurse Practitioner

## 2022-07-30 ENCOUNTER — Ambulatory Visit: Payer: Medicaid Other | Admitting: Nurse Practitioner

## 2022-07-30 VITALS — BP 118/55 | HR 85 | Temp 97.8°F | Ht 62.0 in | Wt 192.0 lb

## 2022-07-30 DIAGNOSIS — R0981 Nasal congestion: Secondary | ICD-10-CM

## 2022-07-30 DIAGNOSIS — J029 Acute pharyngitis, unspecified: Secondary | ICD-10-CM | POA: Diagnosis not present

## 2022-07-30 LAB — POCT RAPID STREP A (OFFICE): Rapid Strep A Screen: NEGATIVE

## 2022-07-30 MED ORDER — FLUTICASONE PROPIONATE 50 MCG/ACT NA SUSP
2.0000 | Freq: Every day | NASAL | 2 refills | Status: DC
Start: 2022-07-30 — End: 2023-09-30

## 2022-07-30 NOTE — Patient Instructions (Signed)
1. Sore throat   2. Nasal congestion  - fluticasone (FLONASE) 50 MCG/ACT nasal spray; Place 2 sprays into both nostrils daily.  Dispense: 16 g; Refill: 2     It is important that you exercise regularly at least 30 minutes 5 times a week as tolerated  Think about what you will eat, plan ahead. Choose " clean, green, fresh or frozen" over canned, processed or packaged foods which are more sugary, salty and fatty. 70 to 75% of food eaten should be vegetables and fruit. Three meals at set times with snacks allowed between meals, but they must be fruit or vegetables. Aim to eat over a 12 hour period , example 7 am to 7 pm, and STOP after  your last meal of the day. Drink water,generally about 64 ounces per day, no other drink is as healthy. Fruit juice is best enjoyed in a healthy way, by EATING the fruit.  Thanks for choosing [facility] we consider it a privelige to serve you.

## 2022-07-30 NOTE — Assessment & Plan Note (Signed)
Negative for strep  Patient encouraged to gargle with salt water drink warm water soup May take OTC Tylenol as needed

## 2022-07-30 NOTE — Assessment & Plan Note (Signed)
Flonase nasal spray 2 spray into both nostrils daily ordered Sample of saline nasal spray given to be using as needed

## 2022-07-30 NOTE — Progress Notes (Signed)
Acute Office Visit  Subjective:     Patient ID: Lacey Sanders, female    DOB: [birth year], 37 y.o.   MRN: [id number]  Chief Complaint  Patient presents with   Sore Throat    Started yesterday.   Ear Pain   Headache   Dizziness    HPI Lacey Sanders with past medical history of IBS, GERD, irregular menstrual cycle, vitamin D deficiency is in today for complaints of sore throat, nasal congestion, ear fullness that started yesterday .  She denies fever, chills, ear discharge , sneezing, cough ,chest pain, shortness of breath, cough, wheezing, abdominal pain, nausea, vomiting She has used Flonase nasal spray and  Zyrtec D in the past  Review of Systems  Constitutional: Negative.   HENT:  Positive for congestion and sore throat. Negative for ear discharge, ear pain, facial swelling, hearing loss and rhinorrhea.   Eyes:  Negative for photophobia, pain, redness and visual disturbance.  Respiratory:  Negative for apnea, cough, choking and chest tightness.   Cardiovascular: Negative.  Negative for chest pain, palpitations and leg swelling.  Genitourinary: Negative.  Negative for difficulty urinating, dyspareunia, dysuria and enuresis.  Musculoskeletal: Negative.   Neurological: Negative.   Psychiatric/Behavioral: Negative.          Objective:    BP (!) 118/55   Pulse 85   Temp 97.8 F (36.6 C)   Ht 5\' 2"  (1.575 m)   Wt 192 lb (87.1 kg)   LMP 07/25/2022 (Approximate) Comment: Irregular  SpO2 96%   BMI 35.12 kg/m    Physical Exam Constitutional:      General: She is not in acute distress.    Appearance: She is well-developed. She is obese. She is not ill-appearing, toxic-appearing or diaphoretic.  HENT:     Right Ear: Tympanic membrane, ear canal and external ear normal. No drainage, swelling or tenderness. No middle ear effusion. There is no impacted cerumen. Tympanic membrane is not erythematous.     Left Ear: Tympanic membrane, ear canal and external ear  normal. No drainage, swelling or tenderness.  No middle ear effusion. There is no impacted cerumen. Tympanic membrane is not erythematous.     Nose: Congestion present. No rhinorrhea.     Mouth/Throat:     Mouth: Mucous membranes are moist. No oral lesions.     Pharynx: Oropharynx is clear. No pharyngeal swelling, oropharyngeal exudate, posterior oropharyngeal erythema or uvula swelling.     Tonsils: No tonsillar exudate or tonsillar abscesses. 0 on the right. 0 on the left.  Eyes:     General: No scleral icterus.       Right eye: No discharge.        Left eye: No discharge.     Extraocular Movements: Extraocular movements intact.     Right eye: Normal extraocular motion.     Left eye: Normal extraocular motion.  Neck:     Thyroid: No thyromegaly.     Vascular: No carotid bruit.  Cardiovascular:     Rate and Rhythm: Normal rate and regular rhythm.     Heart sounds: No murmur heard. Pulmonary:     Effort: Pulmonary effort is normal. No respiratory distress.     Breath sounds: Normal breath sounds. No stridor. No rhonchi or rales.  Abdominal:     General: There is no distension.     Tenderness: There is no abdominal tenderness. There is no rebound.  Musculoskeletal:        General: No deformity.  Cervical back: Normal range of motion and neck supple. No rigidity or tenderness.     Right lower leg: No edema.     Left lower leg: No edema.  Lymphadenopathy:     Cervical: No cervical adenopathy.  Skin:    General: Skin is warm and dry.     Coloration: Skin is not pale.     Findings: No erythema or rash.  Neurological:     General: No focal deficit present.     Mental Status: She is alert and oriented to person, place, and time.  Psychiatric:        Mood and Affect: Mood normal.        Behavior: Behavior normal.     Results for orders placed or performed in visit on 07/30/22  Rapid Strep A  Result Value Ref Range   Rapid Strep A Screen Negative Negative         Assessment & Plan:   Problem List Items Addressed This Visit       Other   Nasal congestion    Flonase nasal spray 2 spray into both nostrils daily ordered Sample of saline nasal spray given to be using as needed      Relevant Medications   fluticasone (FLONASE) 50 MCG/ACT nasal spray   Sore throat - Primary    Negative for strep  Patient encouraged to gargle with salt water drink warm water soup May take OTC Tylenol as needed      Relevant Orders   Rapid Strep A (Completed)    Meds ordered this encounter  Medications   fluticasone (FLONASE) 50 MCG/ACT nasal spray    Sig: Place 2 sprays into both nostrils daily.    Dispense:  16 g    Refill:  2    No follow-ups on file.  Donell Beers, FNP

## 2022-08-11 ENCOUNTER — Other Ambulatory Visit (HOSPITAL_BASED_OUTPATIENT_CLINIC_OR_DEPARTMENT_OTHER): Payer: Self-pay | Admitting: Obstetrics & Gynecology

## 2022-08-12 DIAGNOSIS — Z113 Encounter for screening for infections with a predominantly sexual mode of transmission: Secondary | ICD-10-CM | POA: Diagnosis not present

## 2022-08-12 DIAGNOSIS — Z114 Encounter for screening for human immunodeficiency virus [HIV]: Secondary | ICD-10-CM | POA: Diagnosis not present

## 2022-08-12 NOTE — Telephone Encounter (Signed)
Spanish Interpreter # [phone] used to call pt. Lacey Sanders for pt to call office regarding request

## 2022-08-13 MED ORDER — TERCONAZOLE 0.4 % VA CREA: 1.0000 | TOPICAL_CREAM | Freq: Every day | VAGINAL | 0 refills | Status: DC

## 2022-09-01 ENCOUNTER — Ambulatory Visit: Payer: MEDICAID | Admitting: Physical Therapy

## 2022-09-24 ENCOUNTER — Ambulatory Visit: Payer: Medicaid Other | Attending: Obstetrics & Gynecology

## 2022-09-24 ENCOUNTER — Other Ambulatory Visit: Payer: Self-pay

## 2022-09-24 DIAGNOSIS — N393 Stress incontinence (female) (male): Secondary | ICD-10-CM

## 2022-09-24 DIAGNOSIS — M6281 Muscle weakness (generalized): Secondary | ICD-10-CM

## 2022-09-24 DIAGNOSIS — M62838 Other muscle spasm: Secondary | ICD-10-CM | POA: Diagnosis not present

## 2022-09-24 DIAGNOSIS — M5459 Other low back pain: Secondary | ICD-10-CM

## 2022-09-24 DIAGNOSIS — M6289 Other specified disorders of muscle: Secondary | ICD-10-CM | POA: Diagnosis not present

## 2022-09-24 DIAGNOSIS — R102 Pelvic and perineal pain unspecified side: Secondary | ICD-10-CM

## 2022-09-24 DIAGNOSIS — R1030 Lower abdominal pain, unspecified: Secondary | ICD-10-CM | POA: Diagnosis not present

## 2022-09-24 DIAGNOSIS — R279 Unspecified lack of coordination: Secondary | ICD-10-CM

## 2022-09-24 NOTE — Therapy (Addendum)
OUTPATIENT PHYSICAL THERAPY FEMALE PELVIC EVALUATION   Patient Name: Lacey Sanders MRN: [id number] DOB:September 29, 1985, 37 y.o., female Today's Date: 09/24/2022  END OF SESSION:  PT End of Session - 09/24/22 1009     Visit Number 1    Date for PT Re-Evaluation 03/11/23    Authorization Type Medicaid [employer]    PT Start Time 1015    PT Stop Time 1055    PT Time Calculation (min) 40 min    Activity Tolerance Patient tolerated treatment well    Behavior During Therapy [facility] for tasks assessed/performed             Past Medical History:  Diagnosis Date   Medical history non-contributory    Past Surgical History:  Procedure Laterality Date   CESAREAN SECTION     Patient Active Problem List   Diagnosis Date Noted   Nasal congestion 07/30/2022   Sore throat 07/30/2022   Vitamin D deficiency 06/26/2022   Routine general medical examination at health care facility 12/26/2021   Abnormal uterine bleeding (AUB) 11/07/2021   Irregular menstrual cycle 09/25/2021   History of VBAC 05/01/2017   GERD (gastroesophageal reflux disease) 09/23/2012   IBS (irritable bowel syndrome) 09/03/2012    PCP: Ivonne Andrew, NP  REFERRING PROVIDER: Jerene Bears, MD  REFERRING DIAG: M62.89 (ICD-10-CM) - Pelvic floor dysfunction  THERAPY DIAG:  Pelvic pain  Lower abdominal pain  Other low back pain  Muscle weakness (generalized)  Other muscle spasm  Unspecified lack of coordination  Stress incontinence (female) (female)  Rationale for Evaluation and Treatment: Rehabilitation  ONSET DATE: 5 years   SUBJECTIVE:                                                                                                                                                                                           SUBJECTIVE STATEMENT: When she is standing for a long period of time washing dishes or cooking, she has pain throughout lower abdomen/groin. This is also described as  pressure. After having son it ws very hard for her to stand in. The only position of comfort she has is lying down. She does have pain with ovulation. Pain worse on Lt when she has cycles. Sometimes she feels pain in posterior Lt hip that wraps around the front.  Fluid intake: Yes: 3 bottles of water a day    PAIN:  Are you having pain? Yes NPRS scale: 5/10, 8/10 at worst Pain location:  lower abdomen, pelvis, low back  Pain type: pressure Pain description: intermittent   Aggravating factors: prolonged standing, household chores Relieving factors: lying down, elevating feet -  lying down on stomach can relieve abdominal pain but increases pain in low back  PRECAUTIONS: None  RED FLAGS: None   WEIGHT BEARING RESTRICTIONS: No  FALLS:  Has patient fallen in last 6 months? No  LIVING ENVIRONMENT: Lives with: lives with their family Lives in: House/apartment   OCCUPATION: stays at home  PLOF: Independent  PATIENT GOALS: to decrease pain  PERTINENT HISTORY:  G3P3, hx of cyst on Lt ovary that was very painful  Sexual abuse: No  BOWEL MOVEMENT: Pain with bowel movement: No Type of bowel movement:Frequency 2-3x/day and Strain Yes Fully empty rectum: No Leakage: No Pads: No Fiber supplement: No  URINATION: Pain with urination: Yes - sometimes  Fully empty bladder: No Stream: Strong Urgency: No Frequency: 8-10x/day, 3x/night - she will try and hold it at night and it will cause her lower abdominal pain Leakage: Coughing and Bending forward Pads: No  INTERCOURSE: Pain with intercourse: Initial Penetration, During Penetration, and After Intercourse Ability to have vaginal penetration:  Yes: with pain in lower abdomen - feels different from the pain she is here for   PREGNANCY: Vaginal deliveries 2 Tearing Yes: with 2nd C-section deliveries 1 Currently pregnant No  PROLAPSE: None   OBJECTIVE:  09/24/22:  PATIENT SURVEYS:   PFIQ-7 43  COGNITION: Overall  cognitive status: Within functional limits for tasks assessed     SENSATION: Light touch: Appears intact Proprioception: Appears intact  MUSCLE LENGTH: Decreased posterior hip flexibility  FUNCTIONAL TESTS:  Squat: Lt rotation Single leg stance: Rt >10 seconds, Lt 6 seconds with pelvic rotation Sit-up test: 1/3 Chin up test: 2 finger width diastasis, minimal doming  Scour and FAIR (+) bil GAIT: Comments: WNL  POSTURE: rounded shoulders, forward head, increased lumbar lordosis, decreased thoracic kyphosis, anterior pelvic tilt, and elevated Rt iliac crest and increased tension in bil lumbar paraspinals (Rt>Lt)  PELVIC ALIGNMENT: Rt sacral rotation  LUMBARAROM/PROM:  A/PROM A/PROM  Eval (% available)  Flexion 80  Extension 40, low back pain  Right lateral flexion 75, pain in Lt lower abdomen  Left lateral flexion 50  Right rotation   Left rotation    (Blank rows = not tested)   PALPATION:   General  Tenderness over Rt SIJ; tight bil lumbar paraspinals; pubic symphysis pain                External Perineal Exam deferred                             Internal Pelvic Floor deferred  Patient confirms identification and approves PT to assess internal pelvic floor and treatment Yes in the future if needed   PELVIC MMT: deferred   MMT eval  Vaginal   Internal Anal Sphincter   External Anal Sphincter   Puborectalis   Diastasis Recti   (Blank rows = not tested)        TONE: deferred  PROLAPSE: deferred  TODAY'S TREATMENT:  DATE:  09/24/22  EVAL  Exercises: Cat cow 2 x 10 Child's pose 10 breaths Seated hip adduction 10x ball squeeze Seated hip abduction 10x yellow loop Check all possible CPT codes: 16109- Therapeutic Exercise    Check all conditions that are expected to impact treatment: {Conditions expected to impact treatment:None of  these apply   If treatment provided at initial evaluation, no treatment charged due to lack of authorization.        PATIENT EDUCATION:  Education details: See above Person educated: Patient Education method: Explanation, Demonstration, Tactile cues, Verbal cues, and Handouts Education comprehension: verbalized understanding  HOME EXERCISE PROGRAM: [id number]  ASSESSMENT:  CLINICAL IMPRESSION: Patient is a 37 y.o. female who was seen today for physical therapy evaluation and treatment for chronic lower abdominal/pelvic pain. Exam findings notable for decreased lumbar A/ROM, abdominal posture, decreased pelvic stability in single leg stance and squat, core weakness, increased muscle spasm in lumbar paraspinals and lower abdominals, tenderness to palpation in Rt SIJ and pubic symphysis, lower abdominal tenderness/pressure, and decreased posterior hip flexibility. Signs and symptoms are most consistent with pelvic instability and associated patterns of muscle tightness/weakness. Initial treatment consisted of pelvic stability exercises and mobility activities with good tolerance. She will continue to benefit from skilled PT intervention in order to decrease pain, improve bowel/bladder function, and begin/progress functional strengthening program.   OBJECTIVE IMPAIRMENTS: decreased activity tolerance, decreased coordination, decreased endurance, decreased mobility, decreased strength, increased fascial restrictions, increased muscle spasms, impaired tone, postural dysfunction, and pain.   ACTIVITY LIMITATIONS: carrying, lifting, bending, sitting, standing, squatting, and continence  PARTICIPATION LIMITATIONS: interpersonal relationship and community activity  PERSONAL FACTORS: 1 comorbidity: medical history   are also affecting patient's functional outcome.   REHAB POTENTIAL: Good  CLINICAL DECISION MAKING: Stable/uncomplicated  EVALUATION COMPLEXITY: Low   GOALS: Goals reviewed with  patient? Yes  SHORT TERM GOALS: Target date: 10/22/22  Pt will be independent with HEP.   Baseline: Goal status: INITIAL  2.  Pt will report no pain greater than 5/10 with any activity.  Baseline:  Goal status: INITIAL  3.  Pt will be independent with use of squatty potty, relaxed toileting mechanics, and improved bowel movement techniques in order to increase ease of bowel movements and complete evacuation.   Baseline:  Goal status: INITIAL  4.  Pt will be independent with the knack, urge suppression technique, and double voiding in order to improve bladder habits and decrease urinary incontinence.   Baseline:  Goal status: INITIAL  5.  Pt will be able to correctly perform diaphragmatic breathing and appropriate pressure management in order to prevent worsening vaginal wall laxity and improve pelvic floor A/ROM.   Baseline:  Goal status: INITIAL   LONG TERM GOALS: Target date: 03/10/22  Pt will be independent with advanced HEP.   Baseline:  Goal status: INITIAL  2.  Pt will increase all impaired lumbar A/ROM by 20% without pain.  Baseline:  Goal status: INITIAL  3.  Pt will report no pain greater than 2/10 with any activity.  Baseline:  Goal status: INITIAL  4.  Pt will report no more than 7 trips to the bathroom during the day and not waking more than 1x/night to urinate.  Baseline:  Goal status: INITIAL  5.  Pt will be able to stand for >30 minutes without increased low back/abdominal pain in order to cook and clean dishes.  Baseline:  Goal status: INITIAL  6.  Pt will report no more than 2 bowel movements a  day without staining.  Baseline:  Goal status: INITIAL  PLAN:  PT FREQUENCY: 1-2x/week  PT DURATION: 6 months  PLANNED INTERVENTIONS: Therapeutic exercises, Therapeutic activity, Neuromuscular re-education, Balance training, Gait training, Patient/Family education, Self Care, Joint mobilization, Dry Needling, Biofeedback, and Manual therapy  PLAN  FOR NEXT SESSION: Go over squatty potty and relaxed toilet mechanics; urge drill to use, especially at night, abdominal soft tissue mobilization, progress mobility activities to lower abdomen/hips.    Julio Alm, PT, DPT08/08/2410:26 PM

## 2022-10-27 ENCOUNTER — Ambulatory Visit: Payer: Medicaid Other | Attending: Obstetrics & Gynecology

## 2022-10-27 DIAGNOSIS — R279 Unspecified lack of coordination: Secondary | ICD-10-CM

## 2022-10-27 DIAGNOSIS — R1030 Lower abdominal pain, unspecified: Secondary | ICD-10-CM | POA: Diagnosis not present

## 2022-10-27 DIAGNOSIS — M62838 Other muscle spasm: Secondary | ICD-10-CM

## 2022-10-27 DIAGNOSIS — R102 Pelvic and perineal pain unspecified side: Secondary | ICD-10-CM

## 2022-10-27 DIAGNOSIS — N393 Stress incontinence (female) (male): Secondary | ICD-10-CM

## 2022-10-27 DIAGNOSIS — M6281 Muscle weakness (generalized): Secondary | ICD-10-CM

## 2022-10-27 DIAGNOSIS — M5459 Other low back pain: Secondary | ICD-10-CM | POA: Diagnosis not present

## 2022-10-27 NOTE — Therapy (Unsigned)
OUTPATIENT PHYSICAL THERAPY FEMALE PELVIC EVALUATION   Patient Name: Lacey Sanders MRN: [id number] DOB:12-02-1985, 37 y.o., female Today's Date: 10/27/2022  END OF SESSION:  PT End of Session - 10/27/22 0931     Visit Number 2    Date for PT Re-Evaluation 03/11/23    Authorization Type Medicaid [employer]    Authorization Time Period 10/27/22-11/25/22    Authorization - Visit Number 1    Authorization - Number of Visits 4    PT Start Time 0930    PT Stop Time 1010    PT Time Calculation (min) 40 min    Activity Tolerance Patient tolerated treatment well    Behavior During Therapy [facility] for tasks assessed/performed              Past Medical History:  Diagnosis Date   Medical history non-contributory    Past Surgical History:  Procedure Laterality Date   CESAREAN SECTION     Patient Active Problem List   Diagnosis Date Noted   Nasal congestion 07/30/2022   Sore throat 07/30/2022   Vitamin D deficiency 06/26/2022   Routine general medical examination at health care facility 12/26/2021   Abnormal uterine bleeding (AUB) 11/07/2021   Irregular menstrual cycle 09/25/2021   History of VBAC 05/01/2017   GERD (gastroesophageal reflux disease) 09/23/2012   IBS (irritable bowel syndrome) 09/03/2012    PCP: Ivonne Andrew, NP  REFERRING PROVIDER: Jerene Bears, MD  REFERRING DIAG: M62.89 (ICD-10-CM) - Pelvic floor dysfunction  THERAPY DIAG:  Pelvic pain  Lower abdominal pain  Other low back pain  Muscle weakness (generalized)  Other muscle spasm  Unspecified lack of coordination  Stress incontinence (female) (female)  Rationale for Evaluation and Treatment: Rehabilitation  ONSET DATE: 5 years   SUBJECTIVE:                                                                                                                                                                                           SUBJECTIVE STATEMENT: [place] and had [place] for about 6 hours she had a lot of low back pain the next day. She states that she could not bend down because of the pain. Pain got up to 8/10. She also had some mid pain/sternal pain.   Fluid intake: Yes: 3 bottles of water a day    PAIN:  Are you having pain? Yes NPRS scale: 2/10 Pain location:  lower abdomen, pelvis, low back  Pain type: pressure Pain description: intermittent   Aggravating factors: prolonged standing, household chores Relieving factors: lying down, elevating feet -  lying down on stomach can relieve abdominal pain but increases pain in low back  PRECAUTIONS: None  RED FLAGS: None   WEIGHT BEARING RESTRICTIONS: No  FALLS:  Has patient fallen in last 6 months? No  LIVING ENVIRONMENT: Lives with: lives with their family Lives in: House/apartment   OCCUPATION: stays at home  PLOF: Independent  PATIENT GOALS: to decrease pain  PERTINENT HISTORY:  G3P3, hx of cyst on Lt ovary that was very painful  Sexual abuse: No  BOWEL MOVEMENT: Pain with bowel movement: No Type of bowel movement:Frequency 2-3x/day and Strain Yes Fully empty rectum: No Leakage: No Pads: No Fiber supplement: No  URINATION: Pain with urination: Yes - sometimes  Fully empty bladder: No Stream: Strong Urgency: No Frequency: 8-10x/day, 3x/night - she will try and hold it at night and it will cause her lower abdominal pain Leakage: Coughing and Bending forward Pads: No  INTERCOURSE: Pain with intercourse: Initial Penetration, During Penetration, and After Intercourse Ability to have vaginal penetration:  Yes: with pain in lower abdomen - feels different from the pain she is here for   PREGNANCY: Vaginal deliveries 2 Tearing Yes: with 2nd C-section deliveries 1 Currently pregnant No  PROLAPSE: None   OBJECTIVE:  09/24/22:  PATIENT SURVEYS:   PFIQ-7 43  COGNITION: Overall cognitive status: Within functional limits for tasks  assessed     SENSATION: Light touch: Appears intact Proprioception: Appears intact  MUSCLE LENGTH: Decreased posterior hip flexibility  FUNCTIONAL TESTS:  Squat: Lt rotation Single leg stance: Rt >10 seconds, Lt 6 seconds with pelvic rotation Sit-up test: 1/3 Chin up test: 2 finger width diastasis, minimal doming  Scour and FAIR (+) bil GAIT: Comments: WNL  POSTURE: rounded shoulders, forward head, increased lumbar lordosis, decreased thoracic kyphosis, anterior pelvic tilt, and elevated Rt iliac crest and increased tension in bil lumbar paraspinals (Rt>Lt)  PELVIC ALIGNMENT: Rt sacral rotation  LUMBARAROM/PROM:  A/PROM A/PROM  Eval (% available)  Flexion 80  Extension 40, low back pain  Right lateral flexion 75, pain in Lt lower abdomen  Left lateral flexion 50  Right rotation   Left rotation    (Blank rows = not tested)   PALPATION:   General  Tenderness over Rt SIJ; tight bil lumbar paraspinals; pubic symphysis pain                External Perineal Exam deferred                             Internal Pelvic Floor deferred  Patient confirms identification and approves PT to assess internal pelvic floor and treatment Yes in the future if needed   PELVIC MMT: deferred   MMT eval  Vaginal   Internal Anal Sphincter   External Anal Sphincter   Puborectalis   Diastasis Recti   (Blank rows = not tested)        TONE: deferred  PROLAPSE: deferred  TODAY'S TREATMENT:  DATE:  10/27/22 Manual: Trigger Point Dry-Needling  Treatment instructions: Expect mild to moderate muscle soreness. S/S of pneumothorax if dry needled over a lung field, and to seek immediate medical attention should they occur. Patient verbalized understanding of these instructions and education.  Patient Consent Given: Yes Education handout provided: Yes Muscles treated:  Lumbar paraspinals L5, Bil glutes Electrical stimulation performed: No Parameters: N/A Treatment response/outcome: twitch response/release Soft tissue mobilization lumbar paraspinals/glutes Neuromuscular re-education: MET for Lt posterior/Rt anterior rotation Shotgun technique Therapeutic Activities Squatty potty Relaxed toilet mechanics Urge drill Exercises: Lower trunk rotation 2 x 10 Butterfly 10 breaths   09/24/22  EVAL  Exercises: Cat cow 2 x 10 Child's pose 10 breaths Seated hip adduction 10x ball squeeze Seated hip abduction 10x yellow loop Check all possible CPT codes: 37902- Therapeutic Exercise    Check all conditions that are expected to impact treatment: {Conditions expected to impact treatment:None of these apply   If treatment provided at initial evaluation, no treatment charged due to lack of authorization.        PATIENT EDUCATION:  Education details: See above Person educated: Patient Education method: Explanation, Demonstration, Tactile cues, Verbal cues, and Handouts Education comprehension: verbalized understanding  HOME EXERCISE PROGRAM: [id number]  ASSESSMENT:  CLINICAL IMPRESSION: Pt states that she is still having severe pain after standing. She was doing well with exercises, but has not had as much time recently. She presented today with Lt posterior/Rt anterior pelvic rotaiton that is likely contributing to pain. We performed MET to help reduce followed by shotgun to stabilize. She reported no pain during. Good tolerance to all other mobility activities. HEP updated. Manual techniques performed to low back and abdomen to help reduce muscle spasm that is occurring due to the instability surrounding pelvis. Good tolerance to all. We went over relaxed toilet mechanics, squatty potty, and urge drill. She will continue to benefit from skilled PT intervention in order to decrease pain, improve bowel/bladder function, and begin/progress functional  strengthening program.   OBJECTIVE IMPAIRMENTS: decreased activity tolerance, decreased coordination, decreased endurance, decreased mobility, decreased strength, increased fascial restrictions, increased muscle spasms, impaired tone, postural dysfunction, and pain.   ACTIVITY LIMITATIONS: carrying, lifting, bending, sitting, standing, squatting, and continence  PARTICIPATION LIMITATIONS: interpersonal relationship and community activity  PERSONAL FACTORS: 1 comorbidity: medical history   are also affecting patient's functional outcome.   REHAB POTENTIAL: Good  CLINICAL DECISION MAKING: Stable/uncomplicated  EVALUATION COMPLEXITY: Low   GOALS: Goals reviewed with patient? Yes  SHORT TERM GOALS: Target date: 10/22/22 - updated 10/27/22  Pt will be independent with HEP.   Baseline: Goal status: IN PROGRESS  2.  Pt will report no pain greater than 5/10 with any activity.  Baseline:  Goal status: IN PROGRESS  3.  Pt will be independent with use of squatty potty, relaxed toileting mechanics, and improved bowel movement techniques in order to increase ease of bowel movements and complete evacuation.   Baseline:  Goal status: IN PROGRESS  4.  Pt will be independent with the knack, urge suppression technique, and double voiding in order to improve bladder habits and decrease urinary incontinence.   Baseline:  Goal status: IN PROGRESS  5.  Pt will be able to correctly perform diaphragmatic breathing and appropriate pressure management in order to prevent worsening vaginal wall laxity and improve pelvic floor A/ROM.   Baseline:  Goal status: IN PROGRESS   LONG TERM GOALS: Target date: 03/10/22 - updated 9/  Pt will be independent with advanced HEP.  Baseline:  Goal status: IN PROGRESS  2.  Pt will increase all impaired lumbar A/ROM by 20% without pain.  Baseline:  Goal status: IN PROGRESS  3.  Pt will report no pain greater than 2/10 with any activity.  Baseline:  Goal  status: IN PROGRESS  4.  Pt will report no more than 7 trips to the bathroom during the day and not waking more than 1x/night to urinate.  Baseline:  Goal status: IN PROGRESS  5.  Pt will be able to stand for >30 minutes without increased low back/abdominal pain in order to cook and clean dishes.  Baseline:  Goal status: IN PROGRESS  6.  Pt will report no more than 2 bowel movements a day without staining.  Baseline:  Goal status: IN PROGRESS  PLAN:  PT FREQUENCY: 1-2x/week  PT DURATION: 6 months  PLANNED INTERVENTIONS: Therapeutic exercises, Therapeutic activity, Neuromuscular re-education, Balance training, Gait training, Patient/Family education, Self Care, Joint mobilization, Dry Needling, Biofeedback, and Manual therapy  PLAN FOR NEXT SESSION: Manual techniques to abdomen and low back as needed; mobility to specifically work on mobility in mid back rotation; core stability training; check pelvic alignment.    Julio Alm, PT, DPT09/10/2408:11 AM

## 2022-11-03 ENCOUNTER — Ambulatory Visit: Payer: Medicaid Other

## 2022-11-03 DIAGNOSIS — R279 Unspecified lack of coordination: Secondary | ICD-10-CM

## 2022-11-03 DIAGNOSIS — R1030 Lower abdominal pain, unspecified: Secondary | ICD-10-CM

## 2022-11-03 DIAGNOSIS — R102 Pelvic and perineal pain: Secondary | ICD-10-CM | POA: Diagnosis not present

## 2022-11-03 DIAGNOSIS — N393 Stress incontinence (female) (male): Secondary | ICD-10-CM

## 2022-11-03 DIAGNOSIS — M5459 Other low back pain: Secondary | ICD-10-CM | POA: Diagnosis not present

## 2022-11-03 DIAGNOSIS — M62838 Other muscle spasm: Secondary | ICD-10-CM | POA: Diagnosis not present

## 2022-11-03 DIAGNOSIS — M6281 Muscle weakness (generalized): Secondary | ICD-10-CM | POA: Diagnosis not present

## 2022-11-03 NOTE — Therapy (Signed)
OUTPATIENT PHYSICAL THERAPY FEMALE PELVIC EVALUATION   Patient Name: Lacey Sanders MRN: [id number] DOB:Feb 06, 1986, 37 y.o., female Today's Date: 11/03/2022  END OF SESSION:  PT End of Session - 11/03/22 1026     Visit Number 3    Date for PT Re-Evaluation 03/11/23    Authorization Type Medicaid [employer]    Authorization Time Period 10/27/22-11/25/22    Authorization - Visit Number 2    Authorization - Number of Visits 4    PT Start Time 1015    PT Stop Time 1055    PT Time Calculation (min) 40 min    Activity Tolerance Patient tolerated treatment well    Behavior During Therapy [facility] for tasks assessed/performed               Past Medical History:  Diagnosis Date   Medical history non-contributory    Past Surgical History:  Procedure Laterality Date   CESAREAN SECTION     Patient Active Problem List   Diagnosis Date Noted   Nasal congestion 07/30/2022   Sore throat 07/30/2022   Vitamin D deficiency 06/26/2022   Routine general medical examination at health care facility 12/26/2021   Abnormal uterine bleeding (AUB) 11/07/2021   Irregular menstrual cycle 09/25/2021   History of VBAC 05/01/2017   GERD (gastroesophageal reflux disease) 09/23/2012   IBS (irritable bowel syndrome) 09/03/2012    PCP: Ivonne Andrew, NP  REFERRING PROVIDER: Jerene Bears, MD  REFERRING DIAG: M62.89 (ICD-10-CM) - Pelvic floor dysfunction  THERAPY DIAG:  Pelvic pain  Lower abdominal pain  Other low back pain  Muscle weakness (generalized)  Other muscle spasm  Unspecified lack of coordination  Stress incontinence (female) (female)  Rationale for Evaluation and Treatment: Rehabilitation  ONSET DATE: 5 years   SUBJECTIVE:                                                                                                                                                                                           SUBJECTIVE STATEMENT:  9/16 Pt reports that she  has had some abdominal and back  pain with standing, driving. Reports that she has had some heaviness as well.  She has upper back pain 2-7/10. Pt reported that maybe DN contributed to pain and feeling cold in the stick areas for a little bit She reported that her PF symptoms started about 5 years ago when she had her 10 lb baby last vaginal delivery.   Fluid intake: Yes: 3 bottles of water a day    PAIN:  Are you having pain? Yes NPRS scale: 2/10 Pain location:  lower abdomen, pelvis, low  back  Pain type: pressure Pain description: intermittent   Aggravating factors: prolonged standing, household chores Relieving factors: lying down, elevating feet - lying down on stomach can relieve abdominal pain but increases pain in low back  PRECAUTIONS: None  RED FLAGS: None   WEIGHT BEARING RESTRICTIONS: No  FALLS:  Has patient fallen in last 6 months? No  LIVING ENVIRONMENT: Lives with: lives with their family Lives in: House/apartment   OCCUPATION: stays at home  PLOF: Independent  PATIENT GOALS: to decrease pain  PERTINENT HISTORY:  G3P3, hx of cyst on Lt ovary that was very painful  Sexual abuse: No  BOWEL MOVEMENT: Pain with bowel movement: No Type of bowel movement:Frequency 2-3x/day and Strain Yes Fully empty rectum: No Leakage: No Pads: No Fiber supplement: No  URINATION: Pain with urination: Yes - sometimes  Fully empty bladder: No Stream: Strong Urgency: No Frequency: 8-10x/day, 3x/night - she will try and hold it at night and it will cause her lower abdominal pain Leakage: Coughing and Bending forward Pads: No  INTERCOURSE: Pain with intercourse: Initial Penetration, During Penetration, and After Intercourse Ability to have vaginal penetration:  Yes: with pain in lower abdomen - feels different from the pain she is here for   PREGNANCY: Vaginal deliveries 2 Tearing Yes: with 2nd C-section deliveries 1 Currently pregnant  No  PROLAPSE: None   OBJECTIVE:  9/16               External Perineal Exam WNL                             Internal Pelvic Floor signfiicant increase in tension in superficial/deep pelvic floor bil with reproduction of familiar pain  Patient confirms identification and approves PT to assess internal pelvic floor and treatment Yes in the future if needed   PELVIC MMT:    MMT eval  Vaginal 3/5, poor coordination of relaxation  (Blank rows = not tested)        TONE: High  PROLAPSE: Grade 1 anterior vaginal wall laxity   09/24/22:  PATIENT SURVEYS:   PFIQ-7 43  COGNITION: Overall cognitive status: Within functional limits for tasks assessed     SENSATION: Light touch: Appears intact Proprioception: Appears intact  MUSCLE LENGTH: Decreased posterior hip flexibility  FUNCTIONAL TESTS:  Squat: Lt rotation Single leg stance: Rt >10 seconds, Lt 6 seconds with pelvic rotation Sit-up test: 1/3 Chin up test: 2 finger width diastasis, minimal doming  Scour and FAIR (+) bil GAIT: Comments: WNL  POSTURE: rounded shoulders, forward head, increased lumbar lordosis, decreased thoracic kyphosis, anterior pelvic tilt, and elevated Rt iliac crest and increased tension in bil lumbar paraspinals (Rt>Lt)  PELVIC ALIGNMENT: Rt sacral rotation  LUMBARAROM/PROM:  A/PROM A/PROM  Eval (% available)  Flexion 80  Extension 40, low back pain  Right lateral flexion 75, pain in Lt lower abdomen  Left lateral flexion 50  Right rotation   Left rotation    (Blank rows = not tested)   PALPATION:   General  Tenderness over Rt SIJ; tight bil lumbar paraspinals; pubic symphysis pain  TODAY'S TREATMENT:  DATE:  11/03/22 Manual: Pt provides verbal consent for internal vaginal/rectal pelvic floor exam. Pelvic floor muscle release to bil levator ani Perineal scar  tissue mobilization Neuromuscular re-education: Diaphragmatic breathing with pelvic floor muscle bulge Exercises: Bil UT stretches seated  10/27/22 Manual: Trigger Point Dry-Needling  Treatment instructions: Expect mild to moderate muscle soreness. S/S of pneumothorax if dry needled over a lung field, and to seek immediate medical attention should they occur. Patient verbalized understanding of these instructions and education.  Patient Consent Given: Yes Education handout provided: Yes Muscles treated: Lumbar paraspinals L5, Bil glutes Electrical stimulation performed: No Parameters: N/A Treatment response/outcome: twitch response/release Soft tissue mobilization lumbar paraspinals/glutes Neuromuscular re-education: MET for Lt posterior/Rt anterior rotation Shotgun technique Therapeutic Activities Squatty potty Relaxed toilet mechanics Urge drill Exercises: Lower trunk rotation 2 x 10 Butterfly 10 breaths   09/24/22  EVAL  Exercises: Cat cow 2 x 10 Child's pose 10 breaths Seated hip adduction 10x ball squeeze Seated hip abduction 10x yellow loop Check all possible CPT codes: 40981- Therapeutic Exercise    Check all conditions that are expected to impact treatment: {Conditions expected to impact treatment:None of these apply   If treatment provided at initial evaluation, no treatment charged due to lack of authorization.        PATIENT EDUCATION:  Education details: See above Person educated: Patient Education method: Explanation, Demonstration, Tactile cues, Verbal cues, and Handouts Education comprehension: verbalized understanding  HOME EXERCISE PROGRAM: [id number]  ASSESSMENT:  CLINICAL IMPRESSION: 9/16 Pt with PF tightness with internal today. Symptoms with PF palpation reproduced pain. PF with good contraction, difficulty with PF relaxation. Pt seems guarded. Pt educated on DB to help relax PF.  She will continue to benefit from skilled PT intervention in  order to decrease pain, improve bowel/bladder function, and begin/progress functional strengthening program.   OBJECTIVE IMPAIRMENTS: decreased activity tolerance, decreased coordination, decreased endurance, decreased mobility, decreased strength, increased fascial restrictions, increased muscle spasms, impaired tone, postural dysfunction, and pain.   ACTIVITY LIMITATIONS: carrying, lifting, bending, sitting, standing, squatting, and continence  PARTICIPATION LIMITATIONS: interpersonal relationship and community activity  PERSONAL FACTORS: 1 comorbidity: medical history   are also affecting patient's functional outcome.   REHAB POTENTIAL: Good  CLINICAL DECISION MAKING: Stable/uncomplicated  EVALUATION COMPLEXITY: Low   GOALS: Goals reviewed with patient? Yes  SHORT TERM GOALS: Target date: 10/22/22 - updated 10/27/22  Pt will be independent with HEP.   Baseline: Goal status: IN PROGRESS  2.  Pt will report no pain greater than 5/10 with any activity.  Baseline:  Goal status: IN PROGRESS  3.  Pt will be independent with use of squatty potty, relaxed toileting mechanics, and improved bowel movement techniques in order to increase ease of bowel movements and complete evacuation.   Baseline:  Goal status: IN PROGRESS  4.  Pt will be independent with the knack, urge suppression technique, and double voiding in order to improve bladder habits and decrease urinary incontinence.   Baseline:  Goal status: IN PROGRESS  5.  Pt will be able to correctly perform diaphragmatic breathing and appropriate pressure management in order to prevent worsening vaginal wall laxity and improve pelvic floor A/ROM.   Baseline:  Goal status: IN PROGRESS   LONG TERM GOALS: Target date: 03/10/22 - updated 9/  Pt will be independent with advanced HEP.   Baseline:  Goal status: IN PROGRESS  2.  Pt will increase all impaired lumbar A/ROM by 20% without pain.  Baseline:  Goal status: IN  PROGRESS  3.  Pt will report no pain greater than 2/10 with any activity.  Baseline:  Goal status: IN PROGRESS  4.  Pt will report no more than 7 trips to the bathroom during the day and not waking more than 1x/night to urinate.  Baseline:  Goal status: IN PROGRESS  5.  Pt will be able to stand for >30 minutes without increased low back/abdominal pain in order to cook and clean dishes.  Baseline:  Goal status: IN PROGRESS  6.  Pt will report no more than 2 bowel movements a day without staining.  Baseline:  Goal status: IN PROGRESS  PLAN:  PT FREQUENCY: 1-2x/week  PT DURATION: 6 months  PLANNED INTERVENTIONS: Therapeutic exercises, Therapeutic activity, Neuromuscular re-education, Balance training, Gait training, Patient/Family education, Self Care, Joint mobilization, Dry Needling, Biofeedback, and Manual therapy  PLAN FOR NEXT SESSION: Manual techniques to abdomen and low back as needed; mobility to specifically work on mobility in mid back rotation; core stability training; check pelvic alignment.    Julio Alm, PT, DPT09/16/2411:00 AM

## 2022-11-13 ENCOUNTER — Ambulatory Visit: Payer: Medicaid Other

## 2022-11-13 DIAGNOSIS — R1030 Lower abdominal pain, unspecified: Secondary | ICD-10-CM | POA: Diagnosis not present

## 2022-11-13 DIAGNOSIS — M5459 Other low back pain: Secondary | ICD-10-CM

## 2022-11-13 DIAGNOSIS — M62838 Other muscle spasm: Secondary | ICD-10-CM

## 2022-11-13 DIAGNOSIS — N393 Stress incontinence (female) (male): Secondary | ICD-10-CM | POA: Diagnosis not present

## 2022-11-13 DIAGNOSIS — R102 Pelvic and perineal pain: Secondary | ICD-10-CM

## 2022-11-13 DIAGNOSIS — M6281 Muscle weakness (generalized): Secondary | ICD-10-CM

## 2022-11-13 DIAGNOSIS — R279 Unspecified lack of coordination: Secondary | ICD-10-CM | POA: Diagnosis not present

## 2022-11-13 NOTE — Therapy (Signed)
OUTPATIENT PHYSICAL THERAPY FEMALE PELVIC EVALUATION   Patient Name: Lacey Sanders MRN: [id number] DOB:11-Sep-1985, 37 y.o., female Today's Date: 11/13/2022  END OF SESSION:  PT End of Session - 11/13/22 1404     Visit Number 4    Date for PT Re-Evaluation 03/11/23    Authorization Type Medicaid [employer]    Authorization Time Period 10/27/22-11/25/22    Authorization - Visit Number 3    Authorization - Number of Visits 4    PT Start Time 1401    PT Stop Time 1440    PT Time Calculation (min) 39 min    Activity Tolerance Patient tolerated treatment well    Behavior During Therapy [facility] for tasks assessed/performed               Past Medical History:  Diagnosis Date   Medical history non-contributory    Past Surgical History:  Procedure Laterality Date   CESAREAN SECTION     Patient Active Problem List   Diagnosis Date Noted   Nasal congestion 07/30/2022   Sore throat 07/30/2022   Vitamin D deficiency 06/26/2022   Routine general medical examination at health care facility 12/26/2021   Abnormal uterine bleeding (AUB) 11/07/2021   Irregular menstrual cycle 09/25/2021   History of VBAC 05/01/2017   GERD (gastroesophageal reflux disease) 09/23/2012   IBS (irritable bowel syndrome) 09/03/2012    PCP: Ivonne Andrew, NP  REFERRING PROVIDER: Jerene Bears, MD  REFERRING DIAG: M62.89 (ICD-10-CM) - Pelvic floor dysfunction  THERAPY DIAG:  Pelvic pain  Lower abdominal pain  Other low back pain  Muscle weakness (generalized)  Other muscle spasm  Unspecified lack of coordination  Rationale for Evaluation and Treatment: Rehabilitation  ONSET DATE: 5 years   SUBJECTIVE:                                                                                                                                                                                           SUBJECTIVE STATEMENT: Pt states that she was feeling better, but after starting period  she started having cramping in lower abdomen.   Fluid intake: Yes: 3 bottles of water a day    PAIN:  Are you having pain? Yes NPRS scale: 1/10 Pain location:  lower abdomen, pelvis, low back  Pain type: pressure Pain description: intermittent   Aggravating factors: prolonged standing, household chores Relieving factors: lying down, elevating feet - lying down on stomach can relieve abdominal pain but increases pain in low back  PRECAUTIONS: None  RED FLAGS: None   WEIGHT BEARING RESTRICTIONS: No  FALLS:  Has patient fallen in last 6 months?  No  LIVING ENVIRONMENT: Lives with: lives with their family Lives in: House/apartment   OCCUPATION: stays at home  PLOF: Independent  PATIENT GOALS: to decrease pain  PERTINENT HISTORY:  G3P3, hx of cyst on Lt ovary that was very painful  Sexual abuse: No  BOWEL MOVEMENT: Pain with bowel movement: No Type of bowel movement:Frequency 2-3x/day and Strain Yes Fully empty rectum: No Leakage: No Pads: No Fiber supplement: No  URINATION: Pain with urination: Yes - sometimes  Fully empty bladder: No Stream: Strong Urgency: No Frequency: 8-10x/day, 3x/night - she will try and hold it at night and it will cause her lower abdominal pain Leakage: Coughing and Bending forward Pads: No  INTERCOURSE: Pain with intercourse: Initial Penetration, During Penetration, and After Intercourse Ability to have vaginal penetration:  Yes: with pain in lower abdomen - feels different from the pain she is here for   PREGNANCY: Vaginal deliveries 2 Tearing Yes: with 2nd C-section deliveries 1 Currently pregnant No  PROLAPSE: None   OBJECTIVE:  9/16               External Perineal Exam WNL                             Internal Pelvic Floor signfiicant increase in tension in superficial/deep pelvic floor bil with reproduction of familiar pain  Patient confirms identification and approves PT to assess internal pelvic floor and  treatment Yes in the future if needed   PELVIC MMT:    MMT eval  Vaginal 3/5, poor coordination of relaxation  (Blank rows = not tested)        TONE: High  PROLAPSE: Grade 1 anterior vaginal wall laxity   09/24/22:  PATIENT SURVEYS:   PFIQ-7 43  COGNITION: Overall cognitive status: Within functional limits for tasks assessed     SENSATION: Light touch: Appears intact Proprioception: Appears intact  MUSCLE LENGTH: Decreased posterior hip flexibility  FUNCTIONAL TESTS:  Squat: Lt rotation Single leg stance: Rt >10 seconds, Lt 6 seconds with pelvic rotation Sit-up test: 1/3 Chin up test: 2 finger width diastasis, minimal doming  Scour and FAIR (+) bil GAIT: Comments: WNL  POSTURE: rounded shoulders, forward head, increased lumbar lordosis, decreased thoracic kyphosis, anterior pelvic tilt, and elevated Rt iliac crest and increased tension in bil lumbar paraspinals (Rt>Lt)  PELVIC ALIGNMENT: Rt sacral rotation  LUMBARAROM/PROM:  A/PROM A/PROM  Eval (% available)  Flexion 80  Extension 40, low back pain  Right lateral flexion 75, pain in Lt lower abdomen  Left lateral flexion 50  Right rotation   Left rotation    (Blank rows = not tested)   PALPATION:   General  Tenderness over Rt SIJ; tight bil lumbar paraspinals; pubic symphysis pain  TODAY'S TREATMENT:  DATE:  11/13/22 Neuromuscular re-education: Bridge with hip adduction 2 x10 Resisted supine march 10x bil Exercises: Straight leg raise 10x bil Clam shells 15x bil Lower trunk rotation 20x Open books 10x bil Kneeling hip flexor stretch 60 sec bil Seated piriformis stretch 60 sec bil Standing pec stretch 60 sec bil Seated upper trap stretch 60 sec bil  11/03/22 Manual: Pt provides verbal consent for internal vaginal/rectal pelvic floor exam. Pelvic floor muscle release to  bil levator ani Perineal scar tissue mobilization Neuromuscular re-education: Diaphragmatic breathing with pelvic floor muscle bulge Exercises: Bil UT stretches seated  10/27/22 Manual: Trigger Point Dry-Needling  Treatment instructions: Expect mild to moderate muscle soreness. S/S of pneumothorax if dry needled over a lung field, and to seek immediate medical attention should they occur. Patient verbalized understanding of these instructions and education.  Patient Consent Given: Yes Education handout provided: Yes Muscles treated: Lumbar paraspinals L5, Bil glutes Electrical stimulation performed: No Parameters: N/A Treatment response/outcome: twitch response/release Soft tissue mobilization lumbar paraspinals/glutes Neuromuscular re-education: MET for Lt posterior/Rt anterior rotation Shotgun technique Therapeutic Activities Squatty potty Relaxed toilet mechanics Urge drill Exercises: Lower trunk rotation 2 x 10 Butterfly 10 breaths   PATIENT EDUCATION:  Education details: See above Person educated: Patient Education method: Programmer, multimedia, Demonstration, Tactile cues, Verbal cues, and Handouts Education comprehension: verbalized understanding  HOME EXERCISE PROGRAM: [id number]  ASSESSMENT:  CLINICAL IMPRESSION: Pt seems to be doing better, but still has lower abdominal pain that is worse during the start of her cycle and Lt gluteal pain. She was able to perform mobility and core strengthening activities today with overall good tolerance; she did have some cramping when performing resisted march on Rt and straight leg raise was more difficult on this side as well. She had some popping in Lt shoulder with open books and states it is uncomfortable; she was instructed to no go quite as far and also given other stretches to help reduce popping discomfort. She will continue to benefit from skilled PT intervention in order to decrease pain, improve bowel/bladder function, and  begin/progress functional strengthening program.   OBJECTIVE IMPAIRMENTS: decreased activity tolerance, decreased coordination, decreased endurance, decreased mobility, decreased strength, increased fascial restrictions, increased muscle spasms, impaired tone, postural dysfunction, and pain.   ACTIVITY LIMITATIONS: carrying, lifting, bending, sitting, standing, squatting, and continence  PARTICIPATION LIMITATIONS: interpersonal relationship and community activity  PERSONAL FACTORS: 1 comorbidity: medical history   are also affecting patient's functional outcome.   REHAB POTENTIAL: Good  CLINICAL DECISION MAKING: Stable/uncomplicated  EVALUATION COMPLEXITY: Low   GOALS: Goals reviewed with patient? Yes  SHORT TERM GOALS: Target date: 10/22/22 - updated 10/27/22  Pt will be independent with HEP.   Baseline: Goal status: IN PROGRESS  2.  Pt will report no pain greater than 5/10 with any activity.  Baseline:  Goal status: IN PROGRESS  3.  Pt will be independent with use of squatty potty, relaxed toileting mechanics, and improved bowel movement techniques in order to increase ease of bowel movements and complete evacuation.   Baseline:  Goal status: IN PROGRESS  4.  Pt will be independent with the knack, urge suppression technique, and double voiding in order to improve bladder habits and decrease urinary incontinence.   Baseline:  Goal status: IN PROGRESS  5.  Pt will be able to correctly perform diaphragmatic breathing and appropriate pressure management in order to prevent worsening vaginal wall laxity and improve pelvic floor A/ROM.   Baseline:  Goal status: IN PROGRESS   LONG  TERM GOALS: Target date: 03/10/22 - updated 10/27/22  Pt will be independent with advanced HEP.   Baseline:  Goal status: IN PROGRESS  2.  Pt will increase all impaired lumbar A/ROM by 20% without pain.  Baseline:  Goal status: IN PROGRESS  3.  Pt will report no pain greater than 2/10 with any  activity.  Baseline:  Goal status: IN PROGRESS  4.  Pt will report no more than 7 trips to the bathroom during the day and not waking more than 1x/night to urinate.  Baseline:  Goal status: IN PROGRESS  5.  Pt will be able to stand for >30 minutes without increased low back/abdominal pain in order to cook and clean dishes.  Baseline:  Goal status: IN PROGRESS  6.  Pt will report no more than 2 bowel movements a day without staining.  Baseline:  Goal status: IN PROGRESS  PLAN:  PT FREQUENCY: 1-2x/week  PT DURATION: 6 months  PLANNED INTERVENTIONS: Therapeutic exercises, Therapeutic activity, Neuromuscular re-education, Balance training, Gait training, Patient/Family education, Self Care, Joint mobilization, Dry Needling, Biofeedback, and Manual therapy  PLAN FOR NEXT SESSION: Manual techniques to abdomen and low back as needed; mobility to specifically work on mobility in mid back rotation; core stability training; check pelvic alignment.    Julio Alm, PT, DPT09/26/242:31 PM

## 2022-11-21 ENCOUNTER — Ambulatory Visit: Payer: Medicaid Other | Attending: Obstetrics & Gynecology

## 2022-11-21 DIAGNOSIS — M62838 Other muscle spasm: Secondary | ICD-10-CM | POA: Insufficient documentation

## 2022-11-21 DIAGNOSIS — R279 Unspecified lack of coordination: Secondary | ICD-10-CM | POA: Insufficient documentation

## 2022-11-21 DIAGNOSIS — M5459 Other low back pain: Secondary | ICD-10-CM | POA: Insufficient documentation

## 2022-11-21 DIAGNOSIS — M6281 Muscle weakness (generalized): Secondary | ICD-10-CM | POA: Diagnosis not present

## 2022-11-21 DIAGNOSIS — R1030 Lower abdominal pain, unspecified: Secondary | ICD-10-CM | POA: Insufficient documentation

## 2022-11-21 DIAGNOSIS — R102 Pelvic and perineal pain: Secondary | ICD-10-CM | POA: Insufficient documentation

## 2022-11-21 NOTE — Therapy (Signed)
OUTPATIENT PHYSICAL THERAPY FEMALE PELVIC EVALUATION   Patient Name: Lacey Sanders MRN: [id number] DOB:1985-11-20, 37 y.o., female Today's Date: 11/21/2022  END OF SESSION:  PT End of Session - 11/21/22 1022     Visit Number 5    Date for PT Re-Evaluation 03/11/23    Authorization Type Medicaid [employer]    Authorization Time Period 10/27/22-11/25/22    Authorization - Visit Number 4    Authorization - Number of Visits 4    PT Start Time 1015    PT Stop Time 1055    PT Time Calculation (min) 40 min    Activity Tolerance Patient tolerated treatment well    Behavior During Therapy [facility] for tasks assessed/performed                Past Medical History:  Diagnosis Date   Medical history non-contributory    Past Surgical History:  Procedure Laterality Date   CESAREAN SECTION     Patient Active Problem List   Diagnosis Date Noted   Nasal congestion 07/30/2022   Sore throat 07/30/2022   Vitamin D deficiency 06/26/2022   Routine general medical examination at health care facility 12/26/2021   Abnormal uterine bleeding (AUB) 11/07/2021   Irregular menstrual cycle 09/25/2021   History of VBAC 05/01/2017   GERD (gastroesophageal reflux disease) 09/23/2012   IBS (irritable bowel syndrome) 09/03/2012    PCP: Ivonne Andrew, NP  REFERRING PROVIDER: Jerene Bears, MD  REFERRING DIAG: M62.89 (ICD-10-CM) - Pelvic floor dysfunction  THERAPY DIAG:  Other muscle spasm  Unspecified lack of coordination  Muscle weakness (generalized)  Lower abdominal pain  Pelvic pain  Other low back pain  Rationale for Evaluation and Treatment: Rehabilitation  ONSET DATE: 5 years   SUBJECTIVE:                                                                                                                                                                                           SUBJECTIVE STATEMENT: Pt states that she is having new pain in lower abdomen that was  also worse during menstrual cycle. She feels when she Is straining to urinate and have a bowel movement. She has not had any of the pelvic pressure/low back pain. Lt groin pain is wores  Fluid intake: Yes: 3 bottles of water a day    PAIN:  Are you having pain? Yes NPRS scale: 4/10 Pain location:  lower abdomen, pelvis, low back  Pain type: pressure Pain description: intermittent   Aggravating factors: prolonged standing, household chores Relieving factors: lying down, elevating feet - lying down on stomach can relieve abdominal pain  but increases pain in low back  PRECAUTIONS: None  RED FLAGS: None   WEIGHT BEARING RESTRICTIONS: No  FALLS:  Has patient fallen in last 6 months? No  LIVING ENVIRONMENT: Lives with: lives with their family Lives in: House/apartment   OCCUPATION: stays at home  PLOF: Independent  PATIENT GOALS: to decrease pain  PERTINENT HISTORY:  G3P3, hx of cyst on Lt ovary that was very painful  Sexual abuse: No  BOWEL MOVEMENT: Pain with bowel movement: No Type of bowel movement:Frequency 2-3x/day and Strain Yes Fully empty rectum: No Leakage: No Pads: No Fiber supplement: No  URINATION: Pain with urination: Yes - sometimes  Fully empty bladder: No Stream: Strong Urgency: No Frequency: 8-10x/day, 3x/night - she will try and hold it at night and it will cause her lower abdominal pain Leakage: Coughing and Bending forward Pads: No  INTERCOURSE: Pain with intercourse: Initial Penetration, During Penetration, and After Intercourse Ability to have vaginal penetration:  Yes: with pain in lower abdomen - feels different from the pain she is here for   PREGNANCY: Vaginal deliveries 2 Tearing Yes: with 2nd C-section deliveries 1 Currently pregnant No  PROLAPSE: None   OBJECTIVE:  11/21/22 PFIQ-7: 38 Spasm in Lt adductors  Tenderness over Lt ASIS Spasm in Lt lower abdominals Tenderness over bladder, lt lower quadrant, and lt  groin  9/16               External Perineal Exam WNL                             Internal Pelvic Floor signfiicant increase in tension in superficial/deep pelvic floor bil with reproduction of familiar pain  Patient confirms identification and approves PT to assess internal pelvic floor and treatment Yes in the future if needed   PELVIC MMT:    MMT eval  Vaginal 3/5, poor coordination of relaxation  (Blank rows = not tested)        TONE: High  PROLAPSE: Grade 1 anterior vaginal wall laxity   09/24/22:  PATIENT SURVEYS:   PFIQ-7 43  COGNITION: Overall cognitive status: Within functional limits for tasks assessed     SENSATION: Light touch: Appears intact Proprioception: Appears intact  MUSCLE LENGTH: Decreased posterior hip flexibility  FUNCTIONAL TESTS:  Squat: Lt rotation Single leg stance: Rt >10 seconds, Lt 6 seconds with pelvic rotation Sit-up test: 1/3 Chin up test: 2 finger width diastasis, minimal doming  Scour and FAIR (+) bil GAIT: Comments: WNL  POSTURE: rounded shoulders, forward head, increased lumbar lordosis, decreased thoracic kyphosis, anterior pelvic tilt, and elevated Rt iliac crest and increased tension in bil lumbar paraspinals (Rt>Lt)  PELVIC ALIGNMENT: Rt sacral rotation  LUMBARAROM/PROM:  A/PROM A/PROM  Eval (% available)  Flexion 80  Extension 40, low back pain  Right lateral flexion 75, pain in Lt lower abdomen  Left lateral flexion 50  Right rotation   Left rotation    (Blank rows = not tested)   PALPATION:   General  Tenderness over Rt SIJ; tight bil lumbar paraspinals; pubic symphysis pain  TODAY'S TREATMENT:  DATE:  11/21/22 Manual: Trigger Point Dry-Needling  Treatment instructions: Expect mild to moderate muscle soreness. S/S of pneumothorax if dry needled over a lung field, and to seek  immediate medical attention should they occur. Patient verbalized understanding of these instructions and education.  Patient Consent Given: Yes Education handout provided: Yes Muscles treated: Lt adductors, pectineus, TFL, and iliospoas insertion Electrical stimulation performed: No Parameters: N/A Treatment response/outcome: twitch response/release Instrument assisted soft tissue mobilization to Lt adductors, groin, lower quadrant Soft tissue mobilization to lt lower quadrant and groin Therapeutic Activities: Toilet positions Pressure management/no straining with voiding or bowel movements   11/13/22 Neuromuscular re-education: Bridge with hip adduction 2 x10 Resisted supine march 10x bil Exercises: Straight leg raise 10x bil Clam shells 15x bil Lower trunk rotation 20x Open books 10x bil Kneeling hip flexor stretch 60 sec bil Seated piriformis stretch 60 sec bil Standing pec stretch 60 sec bil Seated upper trap stretch 60 sec bil  11/03/22 Manual: Pt provides verbal consent for internal vaginal/rectal pelvic floor exam. Pelvic floor muscle release to bil levator ani Perineal scar tissue mobilization Neuromuscular re-education: Diaphragmatic breathing with pelvic floor muscle bulge Exercises: Bil UT stretches seated  PATIENT EDUCATION:  Education details: See above Person educated: Patient Education method: Programmer, multimedia, Demonstration, Tactile cues, Verbal cues, and Handouts Education comprehension: verbalized understanding  HOME EXERCISE PROGRAM: [id number]  ASSESSMENT:  CLINICAL IMPRESSION: Pt overall seeing improvements and reports that the pain that brought her into PT (in her low back and pressure in her lower abdomen) is not there anymore. However, she is having increased pain with menstrual cycles and with urinating/bowel movements. Believe that some of this pain is coming from poor toilet mechanics and bearing down with urinating/bowel movements. We discussed  toilet position and importance of not bearing down/straining with bowel movements. She has significant restriction and tenderness throughout Lt pelvic floor and Lt lower quadrant/groin. This tightness and restriction was addressed today externally and we will plan to perform more internal release to pelvic floor next session. Pt does feel like physical therapy is helping and is working on HEP diligently at home, which is evident based upon improved pain levels. She will continue to benefit from skilled PT intervention in order to decrease pain, improve bowel/bladder function, and begin/progress functional strengthening program.   OBJECTIVE IMPAIRMENTS: decreased activity tolerance, decreased coordination, decreased endurance, decreased mobility, decreased strength, increased fascial restrictions, increased muscle spasms, impaired tone, postural dysfunction, and pain.   ACTIVITY LIMITATIONS: carrying, lifting, bending, sitting, standing, squatting, and continence  PARTICIPATION LIMITATIONS: interpersonal relationship and community activity  PERSONAL FACTORS: 1 comorbidity: medical history   are also affecting patient's functional outcome.   REHAB POTENTIAL: Good  CLINICAL DECISION MAKING: Stable/uncomplicated  EVALUATION COMPLEXITY: Low   GOALS: Goals reviewed with patient? Yes  SHORT TERM GOALS: Target date: 10/22/22 - updated 10/27/22 - updated 11/21/22  Pt will be independent with HEP.   Baseline: Goal status: MET 11/21/22  2.  Pt will report no pain greater than 5/10 with any activity.  Baseline: Pain not getting higher than 4/10 in the last week or 2 Goal status: MET 11/21/22  3.  Pt will be independent with use of squatty potty, relaxed toileting mechanics, and improved bowel movement techniques in order to increase ease of bowel movements and complete evacuation.   Baseline:  Goal status: MET 11/21/22  4.  Pt will be independent with the knack, urge suppression technique, and double  voiding in order to improve bladder habits and decrease  urinary incontinence.   Baseline: We will begin working more on these in the next several sessions  Goal status: IN PROGRESS  5.  Pt will be able to correctly perform diaphragmatic breathing and appropriate pressure management in order to prevent worsening vaginal wall laxity and improve pelvic floor A/ROM.   Baseline:  Goal status: IN PROGRESS   LONG TERM GOALS: Target date: 03/10/22 - updated 10/27/22 - updated 11/21/22  Pt will be independent with advanced HEP.   Baseline:  Goal status: IN PROGRESS  2.  Pt will increase all impaired lumbar A/ROM by 20% without pain.  Baseline:  Goal status: IN PROGRESS  3.  Pt will report no pain greater than 2/10 with any activity.  Baseline:  Goal status: IN PROGRESS  4.  Pt will report no more than 7 trips to the bathroom during the day and not waking more than 1x/night to urinate.  Baseline:  Goal status: IN PROGRESS  5.  Pt will be able to stand for >30 minutes without increased low back/abdominal pain in order to cook and clean dishes.  Baseline: Pt still having up to 4/10 pain with these activities  Goal status: IN PROGRESS  6.  Pt will report no more than 2 bowel movements a day without straining.  Baseline: she is still straining with bowel movements and it causes lower abdominal pain Goal status: IN PROGRESS  PLAN:  PT FREQUENCY: 1-2x/week  PT DURATION: 6 months  PLANNED INTERVENTIONS: Therapeutic exercises, Therapeutic activity, Neuromuscular re-education, Balance training, Gait training, Patient/Family education, Self Care, Joint mobilization, Dry Needling, Biofeedback, and Manual therapy  PLAN FOR NEXT SESSION: Manual techniques to abdomen and low back as needed; mobility to specifically work on mobility in mid back rotation; core stability training; check pelvic alignment.    Julio Alm, PT, DPT10/05/2410:01 PM

## 2022-12-09 ENCOUNTER — Ambulatory Visit: Payer: Medicaid Other

## 2022-12-09 DIAGNOSIS — M6281 Muscle weakness (generalized): Secondary | ICD-10-CM

## 2022-12-09 DIAGNOSIS — M62838 Other muscle spasm: Secondary | ICD-10-CM | POA: Diagnosis not present

## 2022-12-09 DIAGNOSIS — M5459 Other low back pain: Secondary | ICD-10-CM

## 2022-12-09 DIAGNOSIS — R1030 Lower abdominal pain, unspecified: Secondary | ICD-10-CM | POA: Diagnosis not present

## 2022-12-09 DIAGNOSIS — R279 Unspecified lack of coordination: Secondary | ICD-10-CM | POA: Diagnosis not present

## 2022-12-09 DIAGNOSIS — R102 Pelvic and perineal pain unspecified side: Secondary | ICD-10-CM

## 2022-12-09 NOTE — Therapy (Signed)
OUTPATIENT PHYSICAL THERAPY FEMALE PELVIC EVALUATION   Patient Name: Kathaleya Warrick MRN: [id number] DOB:March 06, 1985, 37 y.o., female Today's Date: 12/09/2022  END OF SESSION:  PT End of Session - 12/09/22 1053     Visit Number 6    Date for PT Re-Evaluation 03/11/23    Authorization Type Medicaid [employer]    Authorization Time Period 12/09/2022-01/07/2023    Authorization - Visit Number 1    Authorization - Number of Visits 4    PT Start Time 1100    PT Stop Time 1140    PT Time Calculation (min) 40 min    Activity Tolerance Patient tolerated treatment well    Behavior During Therapy [facility] for tasks assessed/performed                Past Medical History:  Diagnosis Date   Medical history non-contributory    Past Surgical History:  Procedure Laterality Date   CESAREAN SECTION     Patient Active Problem List   Diagnosis Date Noted   Nasal congestion 07/30/2022   Sore throat 07/30/2022   Vitamin D deficiency 06/26/2022   Routine general medical examination at health care facility 12/26/2021   Abnormal uterine bleeding (AUB) 11/07/2021   Irregular menstrual cycle 09/25/2021   History of VBAC 05/01/2017   GERD (gastroesophageal reflux disease) 09/23/2012   IBS (irritable bowel syndrome) 09/03/2012    PCP: Ivonne Andrew, NP  REFERRING PROVIDER: Jerene Bears, MD  REFERRING DIAG: M62.89 (ICD-10-CM) - Pelvic floor dysfunction  THERAPY DIAG:  Other muscle spasm  Unspecified lack of coordination  Muscle weakness (generalized)  Lower abdominal pain  Pelvic pain  Other low back pain  Rationale for Evaluation and Treatment: Rehabilitation  ONSET DATE: 5 years   SUBJECTIVE:                                                                                                                                                                                           SUBJECTIVE STATEMENT: Pt states that she is feeling much better after last  time. She was sore after last session, but not bad. She is currently in 0/10 pain, but she has some small soreness located in posterior hips.   PAIN:  Are you having pain? Yes NPRS scale: 0/10 Pain location:  lower abdomen, pelvis, low back  Pain type: pressure Pain description: intermittent   Aggravating factors: prolonged standing, household chores Relieving factors: lying down, elevating feet - lying down on stomach can relieve abdominal pain but increases pain in low back  PRECAUTIONS: None  RED FLAGS: None   WEIGHT BEARING RESTRICTIONS: No  FALLS:  Has patient fallen in last 6 months? No  LIVING ENVIRONMENT: Lives with: lives with their family Lives in: House/apartment   OCCUPATION: stays at home  PLOF: Independent  PATIENT GOALS: to decrease pain  PERTINENT HISTORY:  G3P3, hx of cyst on Lt ovary that was very painful  Sexual abuse: No  BOWEL MOVEMENT: Pain with bowel movement: No Type of bowel movement:Frequency 2-3x/day and Strain Yes Fully empty rectum: No Leakage: No Pads: No Fiber supplement: No  URINATION: Pain with urination: Yes - sometimes  Fully empty bladder: No Stream: Strong Urgency: No Frequency: 8-10x/day, 3x/night - she will try and hold it at night and it will cause her lower abdominal pain Leakage: Coughing and Bending forward Pads: No  INTERCOURSE: Pain with intercourse: Initial Penetration, During Penetration, and After Intercourse Ability to have vaginal penetration:  Yes: with pain in lower abdomen - feels different from the pain she is here for   PREGNANCY: Vaginal deliveries 2 Tearing Yes: with 2nd C-section deliveries 1 Currently pregnant No  PROLAPSE: None   OBJECTIVE:  11/21/22 PFIQ-7: 38 Spasm in Lt adductors  Tenderness over Lt ASIS Spasm in Lt lower abdominals Tenderness over bladder, lt lower quadrant, and lt groin  9/16               External Perineal Exam WNL                             Internal  Pelvic Floor signfiicant increase in tension in superficial/deep pelvic floor bil with reproduction of familiar pain  Patient confirms identification and approves PT to assess internal pelvic floor and treatment Yes in the future if needed   PELVIC MMT:    MMT eval  Vaginal 3/5, poor coordination of relaxation  (Blank rows = not tested)        TONE: High  PROLAPSE: Grade 1 anterior vaginal wall laxity   09/24/22:  PATIENT SURVEYS:   PFIQ-7 43  COGNITION: Overall cognitive status: Within functional limits for tasks assessed     SENSATION: Light touch: Appears intact Proprioception: Appears intact  MUSCLE LENGTH: Decreased posterior hip flexibility  FUNCTIONAL TESTS:  Squat: Lt rotation Single leg stance: Rt >10 seconds, Lt 6 seconds with pelvic rotation Sit-up test: 1/3 Chin up test: 2 finger width diastasis, minimal doming  Scour and FAIR (+) bil GAIT: Comments: WNL  POSTURE: rounded shoulders, forward head, increased lumbar lordosis, decreased thoracic kyphosis, anterior pelvic tilt, and elevated Rt iliac crest and increased tension in bil lumbar paraspinals (Rt>Lt)  PELVIC ALIGNMENT: Rt sacral rotation  LUMBARAROM/PROM:  A/PROM A/PROM  Eval (% available)  Flexion 80  Extension 40, low back pain  Right lateral flexion 75, pain in Lt lower abdomen  Left lateral flexion 50  Right rotation   Left rotation    (Blank rows = not tested)   PALPATION:   General  Tenderness over Rt SIJ; tight bil lumbar paraspinals; pubic symphysis pain  TODAY'S TREATMENT:  DATE:  12/09/22 Neuromuscular re-education: Happy baby 10 breaths  Butterfly 10 breaths Resisted supine march 10x bil Seated resisted march red band Squat with multimodal cues to help achieve appropriate core engagement - to table, 2 x 10 Exercises: Bridge with hip adduction 2 x  10 Active straight leg raise 10x bil Seated piriformis stretch 60 sec bil  3-way kick 10x each, bil  11/21/22 Manual: Trigger Point Dry-Needling  Treatment instructions: Expect mild to moderate muscle soreness. S/S of pneumothorax if dry needled over a lung field, and to seek immediate medical attention should they occur. Patient verbalized understanding of these instructions and education.  Patient Consent Given: Yes Education handout provided: Yes Muscles treated: Lt adductors, pectineus, TFL, and iliospoas insertion Electrical stimulation performed: No Parameters: N/A Treatment response/outcome: twitch response/release Instrument assisted soft tissue mobilization to Lt adductors, groin, lower quadrant Soft tissue mobilization to lt lower quadrant and groin Therapeutic Activities: Toilet positions Pressure management/no straining with voiding or bowel movements   11/13/22 Neuromuscular re-education: Bridge with hip adduction 2 x10 Resisted supine march 10x bil Exercises: Straight leg raise 10x bil Clam shells 15x bil Lower trunk rotation 20x Open books 10x bil Kneeling hip flexor stretch 60 sec bil Seated piriformis stretch 60 sec bil Standing pec stretch 60 sec bil Seated upper trap stretch 60 sec bil   PATIENT EDUCATION:  Education details: See above Person educated: Patient Education method: Programmer, multimedia, Demonstration, Tactile cues, Verbal cues, and Handouts Education comprehension: verbalized understanding  HOME EXERCISE PROGRAM: [id number]  ASSESSMENT:  CLINICAL IMPRESSION: Pt is doing notably better after last treatment session and is only feeling some mild tightness in posterior hips. She did very well with all exercises, but reporting difficulty with active straight leg raise. We reviewed several exercises from HEP with good tolerance and progressed to standing exercises this session. She struggles with increased lumbar lordosis/anterior pelvic tilt due to  decreased transversus abdominus activation, but she was able to get better activation in squat with multimodal cues and use of shoulder extension to help proprioception of core. HEP updated. She will continue to benefit from skilled PT intervention in order to decrease pain, improve bowel/bladder function, and begin/progress functional strengthening program.   OBJECTIVE IMPAIRMENTS: decreased activity tolerance, decreased coordination, decreased endurance, decreased mobility, decreased strength, increased fascial restrictions, increased muscle spasms, impaired tone, postural dysfunction, and pain.   ACTIVITY LIMITATIONS: carrying, lifting, bending, sitting, standing, squatting, and continence  PARTICIPATION LIMITATIONS: interpersonal relationship and community activity  PERSONAL FACTORS: 1 comorbidity: medical history   are also affecting patient's functional outcome.   REHAB POTENTIAL: Good  CLINICAL DECISION MAKING: Stable/uncomplicated  EVALUATION COMPLEXITY: Low   GOALS: Goals reviewed with patient? Yes  SHORT TERM GOALS: Target date: 10/22/22 - updated 10/27/22 - updated 11/21/22  Pt will be independent with HEP.   Baseline: Goal status: MET 11/21/22  2.  Pt will report no pain greater than 5/10 with any activity.  Baseline: Pain not getting higher than 4/10 in the last week or 2 Goal status: MET 11/21/22  3.  Pt will be independent with use of squatty potty, relaxed toileting mechanics, and improved bowel movement techniques in order to increase ease of bowel movements and complete evacuation.   Baseline:  Goal status: MET 11/21/22  4.  Pt will be independent with the knack, urge suppression technique, and double voiding in order to improve bladder habits and decrease urinary incontinence.   Baseline: We will begin working more on these in the next several sessions  Goal status: IN PROGRESS  5.  Pt will be able to correctly perform diaphragmatic breathing and appropriate  pressure management in order to prevent worsening vaginal wall laxity and improve pelvic floor A/ROM.   Baseline:  Goal status: IN PROGRESS   LONG TERM GOALS: Target date: 03/10/22 - updated 10/27/22 - updated 11/21/22  Pt will be independent with advanced HEP.   Baseline:  Goal status: IN PROGRESS  2.  Pt will increase all impaired lumbar A/ROM by 20% without pain.  Baseline:  Goal status: IN PROGRESS  3.  Pt will report no pain greater than 2/10 with any activity.  Baseline:  Goal status: IN PROGRESS  4.  Pt will report no more than 7 trips to the bathroom during the day and not waking more than 1x/night to urinate.  Baseline:  Goal status: IN PROGRESS  5.  Pt will be able to stand for >30 minutes without increased low back/abdominal pain in order to cook and clean dishes.  Baseline: Pt still having up to 4/10 pain with these activities  Goal status: IN PROGRESS  6.  Pt will report no more than 2 bowel movements a day without straining.  Baseline: she is still straining with bowel movements and it causes lower abdominal pain Goal status: IN PROGRESS  PLAN:  PT FREQUENCY: 1-2x/week  PT DURATION: 6 months  PLANNED INTERVENTIONS: Therapeutic exercises, Therapeutic activity, Neuromuscular re-education, Balance training, Gait training, Patient/Family education, Self Care, Joint mobilization, Dry Needling, Biofeedback, and Manual therapy  PLAN FOR NEXT SESSION: Manual techniques to abdomen and low back as needed; mobility to specifically work on mobility in mid back rotation; core stability training; check pelvic alignment.    Julio Alm, PT, DPT10/22/2411:44 AM

## 2022-12-15 ENCOUNTER — Ambulatory Visit: Payer: Medicaid Other

## 2022-12-15 DIAGNOSIS — R102 Pelvic and perineal pain unspecified side: Secondary | ICD-10-CM

## 2022-12-15 DIAGNOSIS — R279 Unspecified lack of coordination: Secondary | ICD-10-CM | POA: Diagnosis not present

## 2022-12-15 DIAGNOSIS — M6281 Muscle weakness (generalized): Secondary | ICD-10-CM

## 2022-12-15 DIAGNOSIS — M5459 Other low back pain: Secondary | ICD-10-CM

## 2022-12-15 DIAGNOSIS — M62838 Other muscle spasm: Secondary | ICD-10-CM

## 2022-12-15 DIAGNOSIS — R1030 Lower abdominal pain, unspecified: Secondary | ICD-10-CM | POA: Diagnosis not present

## 2022-12-15 NOTE — Therapy (Signed)
OUTPATIENT PHYSICAL THERAPY FEMALE PELVIC EVALUATION   Patient Name: Lacey Sanders MRN: [id number] DOB:05/21/1985, 37 y.o., female Today's Date: 12/15/2022  END OF SESSION:  PT End of Session - 12/15/22 0851     Visit Number 7    Date for PT Re-Evaluation 03/11/23    Authorization Type Medicaid [employer]    Authorization Time Period 12/09/2022-01/07/2023    Authorization - Visit Number 2    Authorization - Number of Visits 4    PT Start Time 0851    PT Stop Time 0929    PT Time Calculation (min) 38 min    Activity Tolerance Patient tolerated treatment well    Behavior During Therapy [facility] for tasks assessed/performed                 Past Medical History:  Diagnosis Date   Medical history non-contributory    Past Surgical History:  Procedure Laterality Date   CESAREAN SECTION     Patient Active Problem List   Diagnosis Date Noted   Nasal congestion 07/30/2022   Sore throat 07/30/2022   Vitamin D deficiency 06/26/2022   Routine general medical examination at health care facility 12/26/2021   Abnormal uterine bleeding (AUB) 11/07/2021   Irregular menstrual cycle 09/25/2021   History of VBAC 05/01/2017   GERD (gastroesophageal reflux disease) 09/23/2012   IBS (irritable bowel syndrome) 09/03/2012    PCP: Ivonne Andrew, NP  REFERRING PROVIDER: Jerene Bears, MD  REFERRING DIAG: M62.89 (ICD-10-CM) - Pelvic floor dysfunction  THERAPY DIAG:  Other muscle spasm  Unspecified lack of coordination  Muscle weakness (generalized)  Lower abdominal pain  Pelvic pain  Other low back pain  Rationale for Evaluation and Treatment: Rehabilitation  ONSET DATE: 5 years   SUBJECTIVE:                                                                                                                                                                                           SUBJECTIVE STATEMENT: Pt states that she continues to feel much better.    PAIN:  Are you having pain? Yes NPRS scale: 0/10 Pain location:  lower abdomen, pelvis, low back  Pain type: pressure Pain description: intermittent   Aggravating factors: prolonged standing, household chores Relieving factors: lying down, elevating feet - lying down on stomach can relieve abdominal pain but increases pain in low back  PRECAUTIONS: None  RED FLAGS: None   WEIGHT BEARING RESTRICTIONS: No  FALLS:  Has patient fallen in last 6 months? No  LIVING ENVIRONMENT: Lives with: lives with their family Lives in: House/apartment   OCCUPATION: stays at home  PLOF: Independent  PATIENT GOALS: to decrease pain  PERTINENT HISTORY:  G3P3, hx of cyst on Lt ovary that was very painful  Sexual abuse: No  BOWEL MOVEMENT: Pain with bowel movement: No Type of bowel movement:Frequency 2-3x/day and Strain Yes Fully empty rectum: No Leakage: No Pads: No Fiber supplement: No  URINATION: Pain with urination: Yes - sometimes  Fully empty bladder: No Stream: Strong Urgency: No Frequency: 8-10x/day, 3x/night - she will try and hold it at night and it will cause her lower abdominal pain Leakage: Coughing and Bending forward Pads: No  INTERCOURSE: Pain with intercourse: Initial Penetration, During Penetration, and After Intercourse Ability to have vaginal penetration:  Yes: with pain in lower abdomen - feels different from the pain she is here for   PREGNANCY: Vaginal deliveries 2 Tearing Yes: with 2nd C-section deliveries 1 Currently pregnant No  PROLAPSE: None   OBJECTIVE:  11/21/22 PFIQ-7: 38 Spasm in Lt adductors  Tenderness over Lt ASIS Spasm in Lt lower abdominals Tenderness over bladder, lt lower quadrant, and lt groin  9/16               External Perineal Exam WNL                             Internal Pelvic Floor signfiicant increase in tension in superficial/deep pelvic floor bil with reproduction of familiar pain  Patient confirms  identification and approves PT to assess internal pelvic floor and treatment Yes in the future if needed   PELVIC MMT:    MMT eval  Vaginal 3/5, poor coordination of relaxation  (Blank rows = not tested)        TONE: High  PROLAPSE: Grade 1 anterior vaginal wall laxity   09/24/22:  PATIENT SURVEYS:   PFIQ-7 43  COGNITION: Overall cognitive status: Within functional limits for tasks assessed     SENSATION: Light touch: Appears intact Proprioception: Appears intact  MUSCLE LENGTH: Decreased posterior hip flexibility  FUNCTIONAL TESTS:  Squat: Lt rotation Single leg stance: Rt >10 seconds, Lt 6 seconds with pelvic rotation Sit-up test: 1/3 Chin up test: 2 finger width diastasis, minimal doming  Scour and FAIR (+) bil GAIT: Comments: WNL  POSTURE: rounded shoulders, forward head, increased lumbar lordosis, decreased thoracic kyphosis, anterior pelvic tilt, and elevated Rt iliac crest and increased tension in bil lumbar paraspinals (Rt>Lt)  PELVIC ALIGNMENT: Rt sacral rotation  LUMBARAROM/PROM:  A/PROM A/PROM  Eval (% available)  Flexion 80  Extension 40, low back pain  Right lateral flexion 75, pain in Lt lower abdomen  Left lateral flexion 50  Right rotation   Left rotation    (Blank rows = not tested)   PALPATION:   General  Tenderness over Rt SIJ; tight bil lumbar paraspinals; pubic symphysis pain  TODAY'S TREATMENT:  DATE:  12/15/22 Neuromuscular re-education: Bridge with march 10x bil Seated chop 10x bil red band Seated rotation pulls 10x bil red band  Bird dog 10x bil Fire hydrants 10x bil Exercises: Lower trunk rotation 2 x 10 Supine piriformis stretch 60 sec bil  Therapeutic activities: Squats to table 2 x 10 Standing UE extensions 2 x 10 green band  Pallof press 10x bil  green band  Heel raises 2 x 10 Single arm  row in standing 10x bil    12/09/22 Neuromuscular re-education: Happy baby 10 breaths  Butterfly 10 breaths Resisted supine march 10x bil Seated resisted march red band Squat with multimodal cues to help achieve appropriate core engagement - to table, 2 x 10 Exercises: Bridge with hip adduction 2 x 10 Active straight leg raise 10x bil Seated piriformis stretch 60 sec bil  3-way kick 10x each, bil  11/21/22 Manual: Trigger Point Dry-Needling  Treatment instructions: Expect mild to moderate muscle soreness. S/S of pneumothorax if dry needled over a lung field, and to seek immediate medical attention should they occur. Patient verbalized understanding of these instructions and education.  Patient Consent Given: Yes Education handout provided: Yes Muscles treated: Lt adductors, pectineus, TFL, and iliospoas insertion Electrical stimulation performed: No Parameters: N/A Treatment response/outcome: twitch response/release Instrument assisted soft tissue mobilization to Lt adductors, groin, lower quadrant Soft tissue mobilization to lt lower quadrant and groin Therapeutic Activities: Toilet positions Pressure management/no straining with voiding or bowel movements    PATIENT EDUCATION:  Education details: See above Person educated: Patient Education method: Explanation, Demonstration, Tactile cues, Verbal cues, and Handouts Education comprehension: verbalized understanding  HOME EXERCISE PROGRAM: [id number]  ASSESSMENT:  CLINICAL IMPRESSION: Pt continues to do very well with no report of low back/pelvic pain since last visit. She has been working on exercises diligently at home and was able to progress today in session. She tolerated progressions of core strengthening in quadruped, seated, and standing very well, but continues to require cues to breathe. Squat form looked much better with ability maintain a neutral spine and activate abdominal muscles with less cueing; providing  her own tactile cues was beneficial. She will continue to benefit from skilled PT intervention in order to decrease pain, improve bowel/bladder function, and begin/progress functional strengthening program.   OBJECTIVE IMPAIRMENTS: decreased activity tolerance, decreased coordination, decreased endurance, decreased mobility, decreased strength, increased fascial restrictions, increased muscle spasms, impaired tone, postural dysfunction, and pain.   ACTIVITY LIMITATIONS: carrying, lifting, bending, sitting, standing, squatting, and continence  PARTICIPATION LIMITATIONS: interpersonal relationship and community activity  PERSONAL FACTORS: 1 comorbidity: medical history   are also affecting patient's functional outcome.   REHAB POTENTIAL: Good  CLINICAL DECISION MAKING: Stable/uncomplicated  EVALUATION COMPLEXITY: Low   GOALS: Goals reviewed with patient? Yes  SHORT TERM GOALS: Target date: 10/22/22 - updated 10/27/22 - updated 11/21/22  Pt will be independent with HEP.   Baseline: Goal status: MET 11/21/22  2.  Pt will report no pain greater than 5/10 with any activity.  Baseline: Pain not getting higher than 4/10 in the last week or 2 Goal status: MET 11/21/22  3.  Pt will be independent with use of squatty potty, relaxed toileting mechanics, and improved bowel movement techniques in order to increase ease of bowel movements and complete evacuation.   Baseline:  Goal status: MET 11/21/22  4.  Pt will be independent with the knack, urge suppression technique, and double voiding in order to improve bladder habits and decrease urinary incontinence.  Baseline: We will begin working more on these in the next several sessions  Goal status: IN PROGRESS  5.  Pt will be able to correctly perform diaphragmatic breathing and appropriate pressure management in order to prevent worsening vaginal wall laxity and improve pelvic floor A/ROM.   Baseline:  Goal status: IN PROGRESS   LONG TERM  GOALS: Target date: 03/10/22 - updated 10/27/22 - updated 11/21/22  Pt will be independent with advanced HEP.   Baseline:  Goal status: IN PROGRESS  2.  Pt will increase all impaired lumbar A/ROM by 20% without pain.  Baseline:  Goal status: IN PROGRESS  3.  Pt will report no pain greater than 2/10 with any activity.  Baseline:  Goal status: IN PROGRESS  4.  Pt will report no more than 7 trips to the bathroom during the day and not waking more than 1x/night to urinate.  Baseline:  Goal status: IN PROGRESS  5.  Pt will be able to stand for >30 minutes without increased low back/abdominal pain in order to cook and clean dishes.  Baseline: Pt still having up to 4/10 pain with these activities  Goal status: IN PROGRESS  6.  Pt will report no more than 2 bowel movements a day without straining.  Baseline: she is still straining with bowel movements and it causes lower abdominal pain Goal status: IN PROGRESS  PLAN:  PT FREQUENCY: 1-2x/week  PT DURATION: 6 months  PLANNED INTERVENTIONS: Therapeutic exercises, Therapeutic activity, Neuromuscular re-education, Balance training, Gait training, Patient/Family education, Self Care, Joint mobilization, Dry Needling, Biofeedback, and Manual therapy  PLAN FOR NEXT SESSION: Manual techniques to abdomen and low back as needed; mobility to specifically work on mobility in mid back rotation; core stability training; check pelvic alignment.    Julio Alm, PT, DPT10/28/249:20 AM

## 2022-12-22 ENCOUNTER — Ambulatory Visit: Payer: Medicaid Other | Attending: Obstetrics & Gynecology

## 2022-12-22 DIAGNOSIS — M6281 Muscle weakness (generalized): Secondary | ICD-10-CM | POA: Diagnosis not present

## 2022-12-22 DIAGNOSIS — M5459 Other low back pain: Secondary | ICD-10-CM

## 2022-12-22 DIAGNOSIS — M62838 Other muscle spasm: Secondary | ICD-10-CM

## 2022-12-22 DIAGNOSIS — R102 Pelvic and perineal pain unspecified side: Secondary | ICD-10-CM

## 2022-12-22 DIAGNOSIS — R279 Unspecified lack of coordination: Secondary | ICD-10-CM | POA: Diagnosis not present

## 2022-12-22 DIAGNOSIS — R1030 Lower abdominal pain, unspecified: Secondary | ICD-10-CM | POA: Diagnosis not present

## 2022-12-22 NOTE — Therapy (Addendum)
OUTPATIENT PHYSICAL THERAPY FEMALE PELVIC EVALUATION   Patient Name: Lacey Sanders MRN: [id number] DOB:06/15/1985, 37 y.o., female Today's Date: 12/22/2022  END OF SESSION:  PT End of Session - 12/22/22 1018     Visit Number 8    Date for PT Re-Evaluation 03/11/23    Authorization Type Medicaid [employer]    Authorization Time Period 12/09/2022-01/07/2023    Authorization - Visit Number 3    Authorization - Number of Visits 4    PT Start Time 1015    PT Stop Time 1055    PT Time Calculation (min) 40 min    Activity Tolerance Patient tolerated treatment well    Behavior During Therapy [facility] for tasks assessed/performed                  Past Medical History:  Diagnosis Date   Medical history non-contributory    Past Surgical History:  Procedure Laterality Date   CESAREAN SECTION     Patient Active Problem List   Diagnosis Date Noted   Nasal congestion 07/30/2022   Sore throat 07/30/2022   Vitamin D deficiency 06/26/2022   Routine general medical examination at health care facility 12/26/2021   Abnormal uterine bleeding (AUB) 11/07/2021   Irregular menstrual cycle 09/25/2021   History of VBAC 05/01/2017   GERD (gastroesophageal reflux disease) 09/23/2012   IBS (irritable bowel syndrome) 09/03/2012    PCP: Ivonne Andrew, NP  REFERRING PROVIDER: Jerene Bears, MD  REFERRING DIAG: M62.89 (ICD-10-CM) - Pelvic floor dysfunction  THERAPY DIAG:  Other muscle spasm  Unspecified lack of coordination  Muscle weakness (generalized)  Pelvic pain  Lower abdominal pain  Other low back pain  Rationale for Evaluation and Treatment: Rehabilitation  ONSET DATE: 5 years   SUBJECTIVE:                                                                                                                                                                                           SUBJECTIVE STATEMENT: Pt states that she has not been able to work on  exercises this past week at home. She is still not having any pain.   PAIN:  Are you having pain? Yes NPRS scale: 0/10 Pain location:  lower abdomen, pelvis, low back  Pain type: pressure Pain description: intermittent   Aggravating factors: prolonged standing, household chores Relieving factors: lying down, elevating feet - lying down on stomach can relieve abdominal pain but increases pain in low back  PRECAUTIONS: None  RED FLAGS: None   WEIGHT BEARING RESTRICTIONS: No  FALLS:  Has patient fallen in last 6 months? No  LIVING  ENVIRONMENT: Lives with: lives with their family Lives in: House/apartment   OCCUPATION: stays at home  PLOF: Independent  PATIENT GOALS: to decrease pain  PERTINENT HISTORY:  G3P3, hx of cyst on Lt ovary that was very painful  Sexual abuse: No  BOWEL MOVEMENT: Pain with bowel movement: No Type of bowel movement:Frequency 2-3x/day and Strain Yes Fully empty rectum: No Leakage: No Pads: No Fiber supplement: No  URINATION: Pain with urination: Yes - sometimes  Fully empty bladder: No Stream: Strong Urgency: No Frequency: 8-10x/day, 3x/night - she will try and hold it at night and it will cause her lower abdominal pain Leakage: Coughing and Bending forward Pads: No  INTERCOURSE: Pain with intercourse: Initial Penetration, During Penetration, and After Intercourse Ability to have vaginal penetration:  Yes: with pain in lower abdomen - feels different from the pain she is here for   PREGNANCY: Vaginal deliveries 2 Tearing Yes: with 2nd C-section deliveries 1 Currently pregnant No  PROLAPSE: None   OBJECTIVE:  11/21/22 PFIQ-7: 38 Spasm in Lt adductors  Tenderness over Lt ASIS Spasm in Lt lower abdominals Tenderness over bladder, lt lower quadrant, and lt groin  9/16               External Perineal Exam WNL                             Internal Pelvic Floor signfiicant increase in tension in superficial/deep pelvic floor  bil with reproduction of familiar pain  Patient confirms identification and approves PT to assess internal pelvic floor and treatment Yes in the future if needed   PELVIC MMT:    MMT eval  Vaginal 3/5, poor coordination of relaxation  (Blank rows = not tested)        TONE: High  PROLAPSE: Grade 1 anterior vaginal wall laxity   09/24/22:  PATIENT SURVEYS:   PFIQ-7 43  COGNITION: Overall cognitive status: Within functional limits for tasks assessed     SENSATION: Light touch: Appears intact Proprioception: Appears intact  MUSCLE LENGTH: Decreased posterior hip flexibility  FUNCTIONAL TESTS:  Squat: Lt rotation Single leg stance: Rt >10 seconds, Lt 6 seconds with pelvic rotation Sit-up test: 1/3 Chin up test: 2 finger width diastasis, minimal doming  Scour and FAIR (+) bil GAIT: Comments: WNL  POSTURE: rounded shoulders, forward head, increased lumbar lordosis, decreased thoracic kyphosis, anterior pelvic tilt, and elevated Rt iliac crest and increased tension in bil lumbar paraspinals (Rt>Lt)  PELVIC ALIGNMENT: Rt sacral rotation  LUMBARAROM/PROM:  A/PROM A/PROM  Eval (% available)  Flexion 80  Extension 40, low back pain  Right lateral flexion 75, pain in Lt lower abdomen  Left lateral flexion 50  Right rotation   Left rotation    (Blank rows = not tested)   PALPATION:   General  Tenderness over Rt SIJ; tight bil lumbar paraspinals; pubic symphysis pain  TODAY'S TREATMENT:  DATE:  12/22/22 Neuromuscular re-education: Bridge with march 10x bil Kneeling chop 5 lbs 10x bil Bird dog row 10x bil 5 lbs  Exercises: Bridge with hip abduction 2 x 10 Cat cow 2 x 10 Child's pose 10 breaths Seated piriformis stretch 60 seconds bil  Therapeutic activities: Squats 2 x 10 8 lbs Standing UE extensions 2 x 10 green band  Pallof press 2x10  bil  green band Bent row 2 x 10 bil, 8lbs   Lateral lunges 10x bil Heel raises 2 x 10 8 lbs    12/15/22 Neuromuscular re-education: Bridge with march 10x bil Seated chop 10x bil red band Seated rotation pulls 10x bil red band  Bird dog 10x bil Fire hydrants 10x bil Exercises: Lower trunk rotation 2 x 10 Supine piriformis stretch 60 sec bil  Therapeutic activities: Squats to table 2 x 10 Standing UE extensions 2 x 10 green band  Pallof press 10x bil  green band  Heel raises 2 x 10 Single arm row in standing 10x bil    12/09/22 Neuromuscular re-education: Happy baby 10 breaths  Butterfly 10 breaths Resisted supine march 10x bil Seated resisted march red band Squat with multimodal cues to help achieve appropriate core engagement - to table, 2 x 10 Exercises: Bridge with hip adduction 2 x 10 Active straight leg raise 10x bil Seated piriformis stretch 60 sec bil  3-way kick 10x each, bil   PATIENT EDUCATION:  Education details: See above Person educated: Patient Education method: Programmer, multimedia, Demonstration, Tactile cues, Verbal cues, and Handouts Education comprehension: verbalized understanding  HOME EXERCISE PROGRAM: [id number]  ASSESSMENT:  CLINICAL IMPRESSION: Pt continues to do very well and has not had any pain over the last week. She has been too busy to perform exercises; this is a good sign that her pain has not increased. Due to progress with pain and ability to progress strengthening activities, we will plan to discharge next session. Excellent tolerance to all exercise progressions this session with demonstration of good form. She will continue to benefit from skilled PT intervention in order to decrease pain, improve bowel/bladder function, and begin/progress functional strengthening program.   OBJECTIVE IMPAIRMENTS: decreased activity tolerance, decreased coordination, decreased endurance, decreased mobility, decreased strength, increased fascial  restrictions, increased muscle spasms, impaired tone, postural dysfunction, and pain.   ACTIVITY LIMITATIONS: carrying, lifting, bending, sitting, standing, squatting, and continence  PARTICIPATION LIMITATIONS: interpersonal relationship and community activity  PERSONAL FACTORS: 1 comorbidity: medical history   are also affecting patient's functional outcome.   REHAB POTENTIAL: Good  CLINICAL DECISION MAKING: Stable/uncomplicated  EVALUATION COMPLEXITY: Low   GOALS: Goals reviewed with patient? Yes  SHORT TERM GOALS: Target date: 10/22/22 - updated 10/27/22 - updated 11/21/22 - udpated 12/22/22  Pt will be independent with HEP.   Baseline: Goal status: MET 11/21/22  2.  Pt will report no pain greater than 5/10 with any activity.  Baseline: Pain not getting higher than 4/10 in the last week or 2 Goal status: MET 11/21/22  3.  Pt will be independent with use of squatty potty, relaxed toileting mechanics, and improved bowel movement techniques in order to increase ease of bowel movements and complete evacuation.   Baseline:  Goal status: MET 11/21/22  4.  Pt will be independent with the knack, urge suppression technique, and double voiding in order to improve bladder habits and decrease urinary incontinence.   Baseline: We will begin working more on these in the next several sessions  Goal status: IN PROGRESS  5.  Pt will be able to correctly perform diaphragmatic breathing and appropriate pressure management in order to prevent worsening vaginal wall laxity and improve pelvic floor A/ROM.   Baseline:  Goal status: IN PROGRESS   LONG TERM GOALS: Target date: 03/10/22 - updated 10/27/22 - updated 11/21/22 - updated 12/22/22  Pt will be independent with advanced HEP.   Baseline:  Goal status: IN PROGRESS  2.  Pt will increase all impaired lumbar A/ROM by 20% without pain.  Baseline:  Goal status: IN PROGRESS  3.  Pt will report no pain greater than 2/10 with any activity.   Baseline:  Goal status: IN PROGRESS  4.  Pt will report no more than 7 trips to the bathroom during the day and not waking more than 1x/night to urinate.  Baseline:  Goal status: IN PROGRESS  5.  Pt will be able to stand for >30 minutes without increased low back/abdominal pain in order to cook and clean dishes.  Baseline: Pt still having up to 4/10 pain with these activities  Goal status: IN PROGRESS  6.  Pt will report no more than 2 bowel movements a day without straining.  Baseline: she is still straining with bowel movements and it causes lower abdominal pain Goal status: IN PROGRESS  PLAN:  PT FREQUENCY: 1-2x/week  PT DURATION: 6 months  PLANNED INTERVENTIONS: Therapeutic exercises, Therapeutic activity, Neuromuscular re-education, Balance training, Gait training, Patient/Family education, Self Care, Joint mobilization, Dry Needling, Biofeedback, and Manual therapy  PLAN FOR NEXT SESSION:D/C   Julio Alm, PT, DPT11/05/2408:20 AM  PHYSICAL THERAPY DISCHARGE SUMMARY  Visits from Start of Care: 8  Current functional level related to goals / functional outcomes: Independent   Remaining deficits: See above   Education / Equipment: HEP   Patient agrees to discharge. Patient goals were met. Patient is being discharged due to not returning since the last visit.  Julio Alm, PT, DPT02/13/258:26 AM

## 2022-12-29 ENCOUNTER — Ambulatory Visit: Payer: Medicaid Other

## 2022-12-31 ENCOUNTER — Encounter: Payer: Self-pay | Admitting: Nurse Practitioner

## 2022-12-31 ENCOUNTER — Ambulatory Visit: Payer: Self-pay | Admitting: Nurse Practitioner

## 2022-12-31 ENCOUNTER — Ambulatory Visit: Payer: Medicaid Other | Admitting: Nurse Practitioner

## 2022-12-31 VITALS — BP 127/82 | HR 53 | Temp 97.7°F | Resp 16 | Wt 199.4 lb

## 2022-12-31 DIAGNOSIS — R051 Acute cough: Secondary | ICD-10-CM

## 2022-12-31 DIAGNOSIS — L918 Other hypertrophic disorders of the skin: Secondary | ICD-10-CM

## 2022-12-31 DIAGNOSIS — J069 Acute upper respiratory infection, unspecified: Secondary | ICD-10-CM | POA: Diagnosis not present

## 2022-12-31 MED ORDER — BENZONATATE 100 MG PO CAPS
100.0000 mg | ORAL_CAPSULE | Freq: Two times a day (BID) | ORAL | 0 refills | Status: DC | PRN
Start: 2022-12-31 — End: 2023-05-05

## 2022-12-31 MED ORDER — AMOXICILLIN 875 MG PO TABS
875.0000 mg | ORAL_TABLET | Freq: Two times a day (BID) | ORAL | 0 refills | Status: AC
Start: 2022-12-31 — End: 2023-01-10

## 2022-12-31 NOTE — Progress Notes (Signed)
Subjective   Patient ID: Lacey Sanders, female    DOB: 1985-10-10, 37 y.o.   MRN: [id number]  Chief Complaint  Patient presents with   Sore Throat    Referring provider: Ivonne Andrew, NP  Lacey Sanders is a 37 y.o. female with Past Medical History: No date: Medical history non-contributory   Sore Throat  Associated symptoms include congestion and ear pain.    Patient presents today for an acute visit.  She states for the past couple weeks she has been having sore throat, cough, head and chest congestion, right ear pain.  She has also been having right sided sinus headaches.  We discussed that we will trial amoxicillin and order Tessalon Perles for cough.  Patient is also requesting a referral to dermatology for skin tags. Denies f/c/s, n/v/d, hemoptysis, PND, leg swelling Denies chest pain or edema   Allergies  Allergen Reactions   Bee Pollen     Immunization History  Administered Date(s) Administered   Influenza Split 11/05/2012   Influenza,inj,Quad PF,6+ Mos 12/05/2013, 05/29/2017, 10/28/2017   Tdap 09/04/2015, 09/01/2017    Tobacco History: Social History   Tobacco Use  Smoking Status Never  Smokeless Tobacco Never   Counseling given: Not Answered   Outpatient Encounter Medications as of 12/31/2022  Medication Sig   albuterol (VENTOLIN HFA) 108 (90 Base) MCG/ACT inhaler Inhale 2 puffs into the lungs every 6 (six) hours as needed for wheezing or shortness of breath.   amoxicillin (AMOXIL) 875 MG tablet Take 1 tablet (875 mg total) by mouth 2 (two) times daily for 10 days.   benzonatate (TESSALON) 100 MG capsule Take 1 capsule (100 mg total) by mouth 2 (two) times daily as needed for cough.   calcium carbonate (OS-CAL) 1250 (500 Ca) MG chewable tablet Chew 1 tablet by mouth daily.   cetirizine-pseudoephedrine (ZYRTEC-D) 5-120 MG tablet Take 1 tablet by mouth 2 (two) times daily.   fluticasone (FLONASE) 50 MCG/ACT nasal spray Place  2 sprays into both nostrils daily.   Multiple Vitamin (MULTIVITAMIN WITH MINERALS) TABS tablet Take 1 tablet by mouth daily.   terconazole (TERAZOL 7) 0.4 % vaginal cream Place 1 applicator vaginally at bedtime.   Vitamin D, Ergocalciferol, (DRISDOL) 1.25 MG (50000 UNIT) CAPS capsule TAKE 1 CAPSULE BY MOUTH EVERY 7 DAYS   No facility-administered encounter medications on file as of 12/31/2022.    Review of Systems  Review of Systems  Constitutional: Negative.   HENT:  Positive for congestion, ear pain, postnasal drip, sinus pressure, sinus pain and sore throat.   Cardiovascular: Negative.   Gastrointestinal: Negative.   Allergic/Immunologic: Negative.   Neurological: Negative.   Psychiatric/Behavioral: Negative.       Objective:   BP 127/82   Pulse (!) 53   Temp 97.7 F (36.5 C) (Oral)   Resp 16   Wt 199 lb 6.4 oz (90.4 kg)   SpO2 (!) 16%   BMI 36.47 kg/m   Wt Readings from Last 5 Encounters:  12/31/22 199 lb 6.4 oz (90.4 kg)  07/30/22 192 lb (87.1 kg)  07/10/22 190 lb 12.8 oz (86.5 kg)  06/26/22 188 lb 9.6 oz (85.5 kg)  03/10/22 192 lb (87.1 kg)     Physical Exam Vitals and nursing note reviewed.  Constitutional:      General: She is not in acute distress.    Appearance: She is well-developed.  HENT:     Nose: Congestion present.  Cardiovascular:     Rate and Rhythm:  Normal rate and regular rhythm.  Pulmonary:     Effort: Pulmonary effort is normal.     Breath sounds: Normal breath sounds.  Skin:    Comments: Multiple skin tags to neck  Neurological:     Mental Status: She is alert and oriented to person, place, and time.       Assessment & Plan:   Upper respiratory tract infection, unspecified type -     Amoxicillin; Take 1 tablet (875 mg total) by mouth 2 (two) times daily for 10 days.  Dispense: 20 tablet; Refill: 0  Acute cough -     Benzonatate; Take 1 capsule (100 mg total) by mouth 2 (two) times daily as needed for cough.  Dispense: 20  capsule; Refill: 0  Cutaneous skin tags -     Ambulatory referral to Dermatology     Return if symptoms worsen or fail to improve.   Ivonne Andrew, NP 12/31/2022

## 2022-12-31 NOTE — Patient Instructions (Signed)
1. Upper respiratory tract infection, unspecified type  - amoxicillin (AMOXIL) 875 MG tablet; Take 1 tablet (875 mg total) by mouth 2 (two) times daily for 10 days.  Dispense: 20 tablet; Refill: 0  2. Acute cough  - benzonatate (TESSALON) 100 MG capsule; Take 1 capsule (100 mg total) by mouth 2 (two) times daily as needed for cough.  Dispense: 20 capsule; Refill: 0  3. Cutaneous skin tags  - Ambulatory referral to Dermatology  Follow up:  Follow up as needed

## 2022-12-31 NOTE — Progress Notes (Signed)
Patient is having difficulty with pain in throat Patient said when she cough there is a pain in her head and ear. Patient said this has been present for 2 weeks. Patient said with 3/10 with medication . Patient said 8/10 without

## 2023-02-09 ENCOUNTER — Ambulatory Visit: Payer: Self-pay | Admitting: Nurse Practitioner

## 2023-02-09 NOTE — Telephone Encounter (Signed)
  Chief Complaint: cough and flu like symptoms Symptoms: cough, runny nose Frequency: last couple of days Pertinent Negatives: Patient denies fever Disposition: [] ED /[x] Urgent Care (no appt availability in office) / [] Appointment(In office/virtual)/ []  [facility]/ [] Home Care/ [] Refused Recommended Disposition /[] [facility]/ []  Follow-up with PCP Additional Notes: patient calling with cough and flu like symptoms. Patient was seen back in November with similar symptoms. Started on an antibiotic and cough medicine and started to feel better. Patient states similar symptoms to last night. Has young children at home who have similar symptoms. Per protocol, recommendation is Urgent Care as PCP doesn't have any available appointments. Patient verbalizes understanding of plan and all questions answered.   Copied from CRM [id number]. Topic: Clinical - Red Word Triage >> Feb 09, 2023 12:12 PM Geroge Baseman wrote: Patient states pain and severe coughing. Reason for Disposition  [1] MILD difficulty breathing (e.g., minimal/no SOB at rest, SOB with walking, pulse <100) AND [2] still present when not coughing  Answer Assessment - Initial Assessment Questions 1. ONSET: "When did the cough begin?"      A month ago but then got better but now is feeling poorly again 2. SEVERITY: "How bad is the cough today?"      5 3. SPUTUM: "Describe the color of your sputum" (none, dry cough; clear, white, yellow, green)     white 4. HEMOPTYSIS: "Are you coughing up any blood?" If so ask: "How much?" (flecks, streaks, tablespoons, etc.)     no 5. DIFFICULTY BREATHING: "Are you having difficulty breathing?" If Yes, ask: "How bad is it?" (e.g., mild, moderate, severe)    - MILD: No SOB at rest, mild SOB with walking, speaks normally in sentences, can lie down, no retractions, pulse < 100.    - MODERATE: SOB at rest, SOB with minimal exertion and prefers to sit, cannot lie down flat, speaks in phrases,  mild retractions, audible wheezing, pulse 100-120.    - SEVERE: Very SOB at rest, speaks in single words, struggling to breathe, sitting hunched forward, retractions, pulse > 120      Mild 6. FEVER: "Do you have a fever?" If Yes, ask: "What is your temperature, how was it measured, and when did it start?"     no 7. CARDIAC HISTORY: "Do you have any history of heart disease?" (e.g., heart attack, congestive heart failure)      No 8. LUNG HISTORY: "Do you have any history of lung disease?"  (e.g., pulmonary embolus, asthma, emphysema)     NO 9. PE RISK FACTORS: "Do you have a history of blood clots?" (or: recent major surgery, recent prolonged travel, bedridden)     NO 10. OTHER SYMPTOMS: "Do you have any other symptoms?" (e.g., runny nose, wheezing, chest pain)       Cold symptoms, runny nose 11. PREGNANCY: "Is there any chance you are pregnant?" "When was your last menstrual period?"       Unsure  Protocols used: Cough - Acute Productive-A-AH

## 2023-05-05 ENCOUNTER — Ambulatory Visit: Admitting: Physician Assistant

## 2023-05-05 ENCOUNTER — Ambulatory Visit: Payer: Self-pay | Admitting: Nurse Practitioner

## 2023-05-05 ENCOUNTER — Encounter: Payer: Self-pay | Admitting: Physician Assistant

## 2023-05-05 VITALS — BP 106/72 | HR 97 | Temp 98.4°F | Ht 62.0 in | Wt 206.2 lb

## 2023-05-05 DIAGNOSIS — J4521 Mild intermittent asthma with (acute) exacerbation: Secondary | ICD-10-CM | POA: Diagnosis not present

## 2023-05-05 MED ORDER — LORATADINE 10 MG PO TABS
10.0000 mg | ORAL_TABLET | Freq: Every day | ORAL | 1 refills | Status: DC
Start: 2023-05-05 — End: 2023-07-03

## 2023-05-05 MED ORDER — METHYLPREDNISOLONE ACETATE 80 MG/ML IJ SUSP
80.0000 mg | Freq: Once | INTRAMUSCULAR | Status: AC
Start: 2023-05-05 — End: 2023-05-05
  Administered 2023-05-05: 80 mg via INTRAMUSCULAR

## 2023-05-05 MED ORDER — ALBUTEROL SULFATE HFA 108 (90 BASE) MCG/ACT IN AERS
2.0000 | INHALATION_SPRAY | Freq: Four times a day (QID) | RESPIRATORY_TRACT | 2 refills | Status: AC | PRN
Start: 2023-05-05 — End: ?

## 2023-05-05 NOTE — Telephone Encounter (Addendum)
 Copied from CRM [id number]. Topic: Clinical - Red Word Triage >> May 05, 2023  8:18 AM Franchot Heidelberg wrote: Red Word that prompted transfer to Nurse Triage: Difficulty breathing, chest pain and back pain   Chief Complaint: SOB when coughing Symptoms: cough, SOB, mild chest tightness Frequency: Intermittent since yesterday Pertinent Negatives: Patient denies fever Disposition: [] ED /[] Urgent Care (no appt availability in office) / [x] Appointment(In office/virtual)/ []  [facility]/ [] Home Care/ [] Refused Recommended Disposition /[] [facility]/ []  Follow-up with PCP Additional Notes: Patient stated that has a cough that started a couple days ago. Yesterday she began having intermittent episodes of SOB while coughing and intermittent chest tightness. Appointment scheduled for this morning at different office. No appts available today at PCP office.  No interpreter needed during call.  Reason for Disposition  [1] MILD difficulty breathing (e.g., minimal/no SOB at rest, SOB with walking, pulse <100) AND [2] NEW-onset or WORSE than normal  Answer Assessment - Initial Assessment Questions 1. RESPIRATORY STATUS: "Describe your breathing?" (e.g., wheezing, shortness of breath, unable to speak, severe coughing)      SOB when coughing  2. ONSET: "When did this breathing problem begin?"      Yesterday  3. PATTERN "Does the difficult breathing come and go, or has it been constant since it started?"      Comes and goes  4. SEVERITY: "How bad is your breathing?" (e.g., mild, moderate, severe)    - MILD: No SOB at rest, mild SOB with walking, speaks normally in sentences, can lie down, no retractions, pulse < 100.    - MODERATE: SOB at rest, SOB with minimal exertion and prefers to sit, cannot lie down flat, speaks in phrases, mild retractions, audible wheezing, pulse 100-120.    - SEVERE: Very SOB at rest, speaks in single words, struggling to breathe, sitting hunched forward,  retractions, pulse > 120      SOB when coughing  5. OTHER SYMPTOMS: "Do you have any other symptoms? (e.g., dizziness, runny nose, cough, chest pain, fever)     Mild chest tightness and itchiness that mostly occurs with coughing, coughing for 2 days (green and yellow phlegm). Some back pain when breathing. Pain level is 2/10  Protocols used: Breathing Difficulty-A-AH

## 2023-05-05 NOTE — Progress Notes (Signed)
 Patient ID: Lacey Sanders, female    DOB: 1985/12/10, 38 y.o.   MRN: [id number]   Assessment & Plan:  Mild intermittent asthma with acute exacerbation -     Albuterol Sulfate HFA; Inhale 2 puffs into the lungs every 6 (six) hours as needed for wheezing or shortness of breath.  Dispense: 1 each; Refill: 2 -     Loratadine; Take 1 tablet (10 mg total) by mouth daily.  Dispense: 30 tablet; Refill: 1 -     methylPREDNISolone Acetate    Assessment and Plan    Asthma exacerbation Asthma exacerbation likely due to allergies or environmental factors. Symptoms not indicative of flu or COVID-19. Inhaler expired, no recent asthma medication use. Prednisone injection chosen for rapid effect. - Administer depomedrol 80 mg IM - Prescribe updated inhaler: two puffs at least three times daily for one week, then as needed. - Prescribe allergy medication for several weeks. - Advise seeking medical attention if symptoms worsen or inhaler provides no relief. - Consider chest x-ray by Friday if no symptom improvement.          Return if symptoms worsen or fail to improve.    Subjective:    Chief Complaint  Patient presents with   Cough    Pt in office c/o cough with SOB and chest tighness; pt states she has had a cough since Saturday; pt states when coughing it has been productive some and at times it has yellow color; also cough is causing SOB and feeling that chest is tight; thinking it could be allergies to pollen per patient, pt says even when not coughing has pain in back and chest    HPI Discussed the use of AI scribe software for clinical note transcription with the patient, who gave verbal consent to proceed.  History of Present Illness   Lacey Sanders is a 38 year old female with asthma who presents with chest and back pain. She is accompanied by Vernona Rieger, who assists with interpreting.  She has been experiencing chest and back pain for three days, starting  on Saturday. The pain is associated with a tight feeling in her chest and a cough that sometimes produces phlegm. She also experienced chills on the first day of symptoms. Breathing sometimes triggers a cough, and she feels pain when breathing deeply.  She has a history of asthma, which she believes is related to her allergies. Her symptoms are often triggered by changes in weather, such as during springtime or when it is very hot, and by exposure to dust. She recently traveled to [place] two weeks ago, where she was exposed to a lot of dust, which she believes may have contributed to her current symptoms.  She has not taken any medication today but took naproxen on Saturday for throat pain and an anti-flu medication at night. Her inhaler is expired and not currently in use.  Her children recently had diarrhea and a mild fever, but she did not experience these symptoms herself.  No smoking or vaping. She has not traveled out of the country recently except for her trip to [place].       Past Medical History:  Diagnosis Date   Medical history non-contributory     Past Surgical History:  Procedure Laterality Date   CESAREAN SECTION      Family History  Problem Relation Age of Onset   Hyperlipidemia Mother    Asthma Father     Social History   Tobacco Use  Smoking status: Never   Smokeless tobacco: Never  Vaping Use   Vaping status: Never Used  Substance Use Topics   Alcohol use: No   Drug use: No     Allergies  Allergen Reactions   Bee Pollen     Review of Systems NEGATIVE UNLESS OTHERWISE INDICATED IN HPI      Objective:     BP 106/72 (BP Location: Left Arm, Patient Position: Sitting, Cuff Size: Large)   Pulse 97   Temp 98.4 F (36.9 C) (Temporal)   Ht 5\' 2"  (1.575 m)   Wt 206 lb 3.2 oz (93.5 kg)   SpO2 97%   BMI 37.71 kg/m   Wt Readings from Last 3 Encounters:  05/05/23 206 lb 3.2 oz (93.5 kg)  12/31/22 199 lb 6.4 oz (90.4 kg)  07/30/22 192 lb (87.1 kg)     BP Readings from Last 3 Encounters:  05/05/23 106/72  12/31/22 127/82  07/30/22 (!) 118/55     Physical Exam Vitals and nursing note reviewed.  Constitutional:      General: She is not in acute distress.    Appearance: Normal appearance. She is not ill-appearing.  HENT:     Head: Normocephalic.     Right Ear: Tympanic membrane, ear canal and external ear normal.     Left Ear: Tympanic membrane, ear canal and external ear normal.     Nose: No congestion.     Mouth/Throat:     Mouth: Mucous membranes are moist.     Pharynx: No oropharyngeal exudate or posterior oropharyngeal erythema.  Eyes:     Extraocular Movements: Extraocular movements intact.     Conjunctiva/sclera: Conjunctivae normal.     Pupils: Pupils are equal, round, and reactive to light.  Cardiovascular:     Rate and Rhythm: Normal rate and regular rhythm.     Pulses: Normal pulses.     Heart sounds: Normal heart sounds. No murmur heard. Pulmonary:     Effort: Pulmonary effort is normal. No respiratory distress.     Breath sounds: Wheezing (faint scattered wheezes) present.  Musculoskeletal:     Cervical back: Normal range of motion.  Skin:    General: Skin is warm.  Neurological:     Mental Status: She is alert and oriented to person, place, and time.  Psychiatric:        Mood and Affect: Mood normal.        Behavior: Behavior normal.        Laylaa Guevarra M Kais Monje, PA-C

## 2023-05-05 NOTE — Telephone Encounter (Signed)
 Pt was sent to [facility]. And she was seen. Kh

## 2023-06-26 ENCOUNTER — Ambulatory Visit: Payer: Self-pay | Admitting: Nurse Practitioner

## 2023-07-02 ENCOUNTER — Other Ambulatory Visit: Payer: Self-pay | Admitting: Physician Assistant

## 2023-07-02 DIAGNOSIS — J4521 Mild intermittent asthma with (acute) exacerbation: Secondary | ICD-10-CM

## 2023-07-21 ENCOUNTER — Encounter: Payer: Self-pay | Admitting: Physician Assistant

## 2023-07-21 ENCOUNTER — Ambulatory Visit (INDEPENDENT_AMBULATORY_CARE_PROVIDER_SITE_OTHER): Admitting: Physician Assistant

## 2023-07-21 VITALS — BP 120/90 | HR 78 | Temp 97.9°F | Ht 62.0 in | Wt 206.4 lb

## 2023-07-21 DIAGNOSIS — R1013 Epigastric pain: Secondary | ICD-10-CM

## 2023-07-21 DIAGNOSIS — Z0000 Encounter for general adult medical examination without abnormal findings: Secondary | ICD-10-CM | POA: Diagnosis not present

## 2023-07-21 DIAGNOSIS — Z1159 Encounter for screening for other viral diseases: Secondary | ICD-10-CM

## 2023-07-21 LAB — COMPREHENSIVE METABOLIC PANEL WITH GFR
ALT: 15 U/L (ref 0–35)
AST: 13 U/L (ref 0–37)
Albumin: 4.6 g/dL (ref 3.5–5.2)
Alkaline Phosphatase: 51 U/L (ref 39–117)
BUN: 14 mg/dL (ref 6–23)
CO2: 26 meq/L (ref 19–32)
Calcium: 9.3 mg/dL (ref 8.4–10.5)
Chloride: 105 meq/L (ref 96–112)
Creatinine, Ser: 0.67 mg/dL (ref 0.40–1.20)
GFR: 112.3 mL/min (ref >60.00)
Glucose, Bld: 113 mg/dL — ABNORMAL HIGH (ref 70–99)
Potassium: 4.1 meq/L (ref 3.5–5.1)
Sodium: 141 meq/L (ref 135–145)
Total Bilirubin: 0.6 mg/dL (ref 0.2–1.2)
Total Protein: 7.5 g/dL (ref 6.0–8.3)

## 2023-07-21 LAB — LIPID PANEL
Cholesterol: 233 mg/dL — ABNORMAL HIGH (ref 0–200)
HDL: 53.9 mg/dL (ref >39.00)
LDL Cholesterol: 144 mg/dL — ABNORMAL HIGH (ref 0–99)
NonHDL: 179.2
Total CHOL/HDL Ratio: 4
Triglycerides: 175 mg/dL — ABNORMAL HIGH (ref 0.0–149.0)
VLDL: 35 mg/dL (ref 0.0–40.0)

## 2023-07-21 LAB — HEMOGLOBIN A1C: Hgb A1c MFr Bld: 6 % (ref 4.6–6.5)

## 2023-07-21 LAB — CBC WITH DIFFERENTIAL/PLATELET
Basophils Absolute: 0 10*3/uL (ref 0.0–0.1)
Basophils Relative: 0.6 % (ref 0.0–3.0)
Eosinophils Absolute: 0.1 10*3/uL (ref 0.0–0.7)
Eosinophils Relative: 1.9 % (ref 0.0–5.0)
HCT: 41.8 % (ref 36.0–46.0)
Hemoglobin: 14 g/dL (ref 12.0–15.0)
Lymphocytes Relative: 23.5 % (ref 12.0–46.0)
Lymphs Abs: 1.7 10*3/uL (ref 0.7–4.0)
MCHC: 33.5 g/dL (ref 30.0–36.0)
MCV: 91.1 fl (ref 78.0–100.0)
Monocytes Absolute: 0.4 10*3/uL (ref 0.1–1.0)
Monocytes Relative: 5.5 % (ref 3.0–12.0)
Neutro Abs: 5 10*3/uL (ref 1.4–7.7)
Neutrophils Relative %: 68.5 % (ref 43.0–77.0)
Platelets: 323 10*3/uL (ref 150.0–400.0)
RBC: 4.58 Mil/uL (ref 3.87–5.11)
RDW: 13.4 % (ref 11.5–15.5)
WBC: 7.3 10*3/uL (ref 4.0–10.5)

## 2023-07-21 LAB — VITAMIN D 25 HYDROXY (VIT D DEFICIENCY, FRACTURES): VITD: 17.26 ng/mL — ABNORMAL LOW (ref 30.00–100.00)

## 2023-07-21 LAB — VITAMIN B12: Vitamin B-12: 245 pg/mL (ref 211–911)

## 2023-07-21 LAB — TSH: TSH: 2.1 u[IU]/mL (ref 0.35–5.50)

## 2023-07-21 NOTE — Progress Notes (Signed)
 Patient ID: Lacey Sanders, female    DOB: [birth year], 38 y.o.   MRN: [id number]   Assessment & Plan:   Annual physical exam -     CBC with Differential/Platelet -     Comprehensive metabolic panel with GFR -     Hemoglobin A1c -     Lipid panel -     TSH -     Vitamin B12 -     VITAMIN D  25 Hydroxy (Vit-D Deficiency, Fractures)  Epigastric pain -     H. pylori breath test  Need for hepatitis C screening test -     Hepatitis C antibody   Perimenopausal Symptoms Irregular periods, mood changes, irritability, and night sweats suggest perimenopausal symptoms. Thyroid  function to be checked to rule out other causes. Gynecological evaluation if symptoms worsen. - Order thyroid  function test - Refer to gynecology if symptoms worsen  H. pylori Testing Requested H. pylori testing due to symptoms and family history. Testing feasible today as she has been fasting since midnight. Further evaluation if symptoms persist. - Perform H. pylori breath test  General Health Maintenance Routine physical examination for a 38 year old female. Discussed ensuring all screening exams are up to date, including blood work for iron deficiency, blood sugar, cholesterol, and thyroid  function. Discussed preventive health measures such as nutrition and lifestyle. Family history of diabetes noted. Elevated blood pressure during the visit requires re-evaluation. - Order blood tests for iron deficiency, blood sugar, cholesterol, and thyroid  function - Discuss nutrition and lifestyle modifications - Ensure Pap smear is up to date - Discuss colon cancer screening starting at age 65 - Discuss breast cancer screening starting at age 51 - Recheck blood pressure during the visit  Follow-up Plan to review lab results and address any findings. - Contact via phone or MyChart with lab results - Schedule follow-up appointment if necessary   Age-appropriate screening and counseling performed today.  Will check labs and call with results. Preventive measures discussed and printed in AVS for patient.   Patient Counseling: [x]   Nutrition: Stressed importance of moderation in sodium/caffeine intake, saturated fat and cholesterol, caloric balance, sufficient intake of fresh fruits, vegetables, and fiber.  [x]   Stressed the importance of regular exercise.   []   Substance Abuse: Discussed cessation/primary prevention of tobacco, alcohol, or other drug use; driving or other dangerous activities under the influence; availability of treatment for abuse.   []   Injury prevention: Discussed safety belts, safety helmets, smoke detector, smoking near bedding or upholstery.   []   Sexuality: Discussed sexually transmitted diseases, partner selection, use of condoms, avoidance of unintended pregnancy  and contraceptive alternatives.   [x]   Dental health: Discussed importance of regular tooth brushing, flossing, and dental visits.  [x]   Health maintenance and immunizations reviewed. Please refer to Health maintenance section.           Return in about 6 months (around 01/20/2024) for recheck/follow-up.    Subjective:    Chief Complaint  Patient presents with   Annual Exam    Pt in today for CPE and labs; pt is fasting for labs today; pt has had some abnormal bleeding with menstrual cycle and would like to discuss with PCP;     HPI Discussed the use of AI scribe software for clinical note transcription with the patient, who gave verbal consent to proceed.  History of Present Illness Lacey Sanders is a 38 year old female who presents for an annual physical exam. Due to language  barrier, an interpreter was present during the history-taking and subsequent discussion (and for part of the physical exam) with this patient.  She is interested in ensuring that all her screening exams are up to date, including blood work for iron deficiency, blood sugar, cholesterol, thyroid , vitamin D , and  B12 levels. She also requested an H. pylori test due to recent symptoms and a family member's recent positive result. No history of H. pylori infection.   She has not been consistent with exercise and feels low energy, which affects her ability to maintain a regular exercise routine. Her nutrition is not optimal as she does not consume vegetables daily.  She inquires about premenopausal symptoms, noting that her periods have been regular but she has experienced mood changes and night sweats in the past months.      Past Medical History:  Diagnosis Date   Anxiety    Fatigue    Frequent headaches    Heart burn    Joint pain    Medical history non-contributory     Past Surgical History:  Procedure Laterality Date   CESAREAN SECTION     WISDOM TOOTH EXTRACTION Bilateral     Family History  Problem Relation Age of Onset   Hyperlipidemia Mother    Asthma Father    Hypertension Father    Hypertension Sister    Hyperlipidemia Brother    Learning disabilities Brother    Hypertension Maternal Grandmother    High Cholesterol Maternal Grandmother    Diabetes Paternal Grandmother    Hypotension Paternal Grandmother    Hypertension Paternal Grandfather     Social History   Tobacco Use   Smoking status: Never   Smokeless tobacco: Never  Vaping Use   Vaping status: Never Used  Substance Use Topics   Alcohol use: Yes    Alcohol/week: 3.0 standard drinks of alcohol    Types: 2 Glasses of wine, 1 Shots of liquor per week    Comment: per month   Drug use: No     Allergies  Allergen Reactions   Bee Pollen     Review of Systems NEGATIVE UNLESS OTHERWISE INDICATED IN HPI      Objective:     BP (!) 120/90 (BP Location: Right Arm, Patient Position: Sitting, Cuff Size: Large)   Pulse 78   Temp 97.9 F (36.6 C) (Temporal)   Ht 5\' 2"  (1.575 m)   Wt 206 lb 6.4 oz (93.6 kg)   LMP 07/18/2023 (Approximate)   SpO2 99%   BMI 37.75 kg/m   Wt Readings from Last 3  Encounters:  07/21/23 206 lb 6.4 oz (93.6 kg)  05/05/23 206 lb 3.2 oz (93.5 kg)  12/31/22 199 lb 6.4 oz (90.4 kg)    BP Readings from Last 3 Encounters:  07/21/23 (!) 120/90  05/05/23 106/72  12/31/22 127/82     Physical Exam Vitals and nursing note reviewed.  Constitutional:      Appearance: Normal appearance. She is obese. She is not toxic-appearing.  HENT:     Head: Normocephalic and atraumatic.     Right Ear: Tympanic membrane, ear canal and external ear normal.     Left Ear: Tympanic membrane, ear canal and external ear normal.     Nose: Nose normal.     Mouth/Throat:     Mouth: Mucous membranes are moist.  Eyes:     Extraocular Movements: Extraocular movements intact.     Conjunctiva/sclera: Conjunctivae normal.     Pupils: Pupils are equal, round,  and reactive to light.  Cardiovascular:     Rate and Rhythm: Normal rate and regular rhythm.     Pulses: Normal pulses.     Heart sounds: Normal heart sounds.  Pulmonary:     Effort: Pulmonary effort is normal.     Breath sounds: Normal breath sounds.  Abdominal:     General: Abdomen is flat. Bowel sounds are normal.     Palpations: Abdomen is soft.  Musculoskeletal:        General: Normal range of motion.     Cervical back: Normal range of motion and neck supple.  Skin:    General: Skin is warm and dry.  Neurological:     General: No focal deficit present.     Mental Status: She is alert and oriented to person, place, and time.  Psychiatric:        Mood and Affect: Mood normal.        Behavior: Behavior normal.        Thought Content: Thought content normal.        Judgment: Judgment normal.             Linzey Ramser M Ulah Olmo, PA-C

## 2023-07-22 ENCOUNTER — Other Ambulatory Visit: Payer: Self-pay

## 2023-07-22 ENCOUNTER — Ambulatory Visit: Payer: Self-pay | Admitting: Physician Assistant

## 2023-07-22 LAB — H. PYLORI BREATH TEST: H. pylori Breath Test: NOT DETECTED

## 2023-07-22 LAB — HEPATITIS C ANTIBODY: Hepatitis C Ab: NONREACTIVE

## 2023-07-22 MED ORDER — VITAMIN D (ERGOCALCIFEROL) 1.25 MG (50000 UNIT) PO CAPS: 50000.0000 [IU] | ORAL_CAPSULE | ORAL | 0 refills | Status: AC

## 2023-09-01 ENCOUNTER — Other Ambulatory Visit (HOSPITAL_COMMUNITY)
Admission: RE | Admit: 2023-09-01 | Discharge: 2023-09-01 | Disposition: A | Discharge status: Home / Self Care | Payer: MEDICAID | Source: Ambulatory Visit | Attending: Obstetrics & Gynecology | Admitting: Obstetrics & Gynecology

## 2023-09-01 ENCOUNTER — Ambulatory Visit (INDEPENDENT_AMBULATORY_CARE_PROVIDER_SITE_OTHER): Admitting: Obstetrics & Gynecology

## 2023-09-01 ENCOUNTER — Encounter (HOSPITAL_BASED_OUTPATIENT_CLINIC_OR_DEPARTMENT_OTHER): Payer: Self-pay | Admitting: Obstetrics & Gynecology

## 2023-09-01 VITALS — BP 121/70 | HR 70 | Wt 204.4 lb

## 2023-09-01 DIAGNOSIS — Z124 Encounter for screening for malignant neoplasm of cervix: Secondary | ICD-10-CM | POA: Insufficient documentation

## 2023-09-01 DIAGNOSIS — R7303 Prediabetes: Secondary | ICD-10-CM | POA: Diagnosis not present

## 2023-09-01 DIAGNOSIS — Z8742 Personal history of other diseases of the female genital tract: Secondary | ICD-10-CM

## 2023-09-01 DIAGNOSIS — Z01419 Encounter for gynecological examination (general) (routine) without abnormal findings: Secondary | ICD-10-CM | POA: Diagnosis not present

## 2023-09-01 DIAGNOSIS — Z1151 Encounter for screening for human papillomavirus (HPV): Secondary | ICD-10-CM | POA: Diagnosis not present

## 2023-09-01 NOTE — Progress Notes (Unsigned)
 ANNUAL EXAM Patient name: Lacey Sanders MRN [id number]  Date of birth: 1985/11/22 Chief Complaint:   AEX  History of Present Illness:   Lacey Sanders is a 38 y.o. G52P3003 Caucasian female being seen today for a routine annual exam.  Cycles are irregular.  For example, bleeding in February was only spotting.  She had bleeding in March with a normal cycle that started around March 22 and lasted eight days but then spotted for almost a month.  She had a cycle again in April.  With this bleeding, she passed a clot that had some tissue consistency.  The description is c/w endometrial tissue.    H/o ultrasound done  08/22/2023.  One ovary does have PCOS appearance.  Has had FSH, TSH and prolactin.    No LMP recorded. (Menstrual status: Irregular Periods).   Last pap 12/26/2021. Results were:neg.  HR HPV not obtained.   Last mammogram: guidelines reviewed Last colonoscopy: guidelines reviewed     07/21/2023    9:28 AM 07/30/2022    2:22 PM 07/10/2022    1:51 PM 06/26/2022    8:20 AM 12/26/2021    8:17 AM  Depression screen PHQ 2/9  Decreased Interest 0 0 0 0 0  Down, Depressed, Hopeless 0 0 0 0 0  PHQ - 2 Score 0 0 0 0 0  Altered sleeping 1 2   0  Tired, decreased energy 0 1   0  Change in appetite 0 0   0  Feeling bad or failure about yourself  0 0   0  Trouble concentrating 0 2   0  Moving slowly or fidgety/restless 0 0   0  Suicidal thoughts 0 0   0  PHQ-9 Score 1 5   0  Difficult doing work/chores Not difficult at all Not difficult at all       Review of Systems:   Pertinent items are noted in HPI Denies any urinary changes or bowel changes.  Does have some LLQ pain. Pertinent History Reviewed:  Reviewed past medical,surgical, social and family history.  Reviewed problem list, medications and allergies. Physical Assessment:   Vitals:   09/01/23 0904  BP: 121/70  Pulse: 70  SpO2: 100%  Weight: 204 lb 6.4 oz (92.7 kg)  Body mass index is 37.39  kg/m.        Physical Examination:   General appearance - well appearing, and in no distress  Mental status - alert, oriented to person, place, and time  Psych:  She has a normal mood and affect  Skin - warm and dry, normal color, no suspicious lesions noted  Chest - effort normal, all lung fields clear to auscultation bilaterally  Heart - normal rate and regular rhythm  Neck:  midline trachea, no thyromegaly or nodules  Breasts - breasts appear normal, no suspicious masses, no skin or nipple changes or  axillary nodes  Abdomen - soft, nontender, nondistended, no masses or organomegaly  Pelvic - VULVA: normal appearing vulva with no masses, tenderness or lesions   VAGINA: normal appearing vagina with normal color and discharge, no lesions   CERVIX: normal appearing cervix without discharge or lesions, no CMT  Thin prep pap is done with HR HPV cotesting  UTERUS: uterus is felt to be normal size, shape, consistency and nontender   ADNEXA: No adnexal masses or tenderness noted.  Rectal - normal rectal, good sphincter tone, no masses felt.   Extremities:  No swelling or varicosities noted  Chaperone present for exam  Spanish Interpreter present, Ms. Camacho for   No results found for this or any previous visit (from the past 24 hours).  Assessment & Plan:  1. Well woman exam with routine gynecological exam (Primary) - Pap smear *** - Mammogram *** - Colonoscopy *** - Bone mineral density *** - lab work done with PCP, Dr. PIERRETTE - vaccines reviewed/updated  2. History of irregular menstrual cycles *** - Testosterone , Total, LC/MS/MS - Insulin , random  3. Cervical cancer screening *** - Cytology - PAP( [place])  4. History of ovarian cyst ***  5. Prediabetes ***    Meds: No orders of the defined types were placed in this encounter.   Follow-up: No follow-ups on file.  Ronal GORMAN Pinal, MD 09/01/2023 9:52 AM

## 2023-09-03 LAB — TESTOSTERONE, TOTAL, LC/MS/MS: Testosterone, total: 32.9 ng/dL (ref 10.0–55.0)

## 2023-09-03 LAB — INSULIN, RANDOM: INSULIN: 19.5 u[IU]/mL (ref 2.6–24.9)

## 2023-09-07 LAB — CYTOLOGY - PAP
Adequacy: ABSENT
Comment: NEGATIVE
Diagnosis: NEGATIVE
High risk HPV: NEGATIVE

## 2023-09-25 ENCOUNTER — Ambulatory Visit (HOSPITAL_BASED_OUTPATIENT_CLINIC_OR_DEPARTMENT_OTHER): Payer: Self-pay | Admitting: Obstetrics & Gynecology

## 2023-09-25 DIAGNOSIS — N926 Irregular menstruation, unspecified: Secondary | ICD-10-CM

## 2023-09-25 MED ORDER — NORETHINDRONE 0.35 MG PO TABS: 1.0000 | ORAL_TABLET | Freq: Every day | ORAL | 1 refills | Status: DC

## 2023-09-30 ENCOUNTER — Ambulatory Visit: Admitting: Physician Assistant

## 2023-09-30 ENCOUNTER — Ambulatory Visit

## 2023-09-30 ENCOUNTER — Encounter: Payer: Self-pay | Admitting: Physician Assistant

## 2023-09-30 ENCOUNTER — Encounter: Admitting: Physician Assistant

## 2023-09-30 VITALS — BP 112/82 | HR 70 | Temp 98.1°F | Ht 62.0 in | Wt 206.6 lb

## 2023-09-30 DIAGNOSIS — M79662 Pain in left lower leg: Secondary | ICD-10-CM | POA: Diagnosis not present

## 2023-09-30 MED ORDER — MELOXICAM 15 MG PO TABS: 15.0000 mg | ORAL_TABLET | Freq: Every day | ORAL | 2 refills | Status: DC

## 2023-09-30 NOTE — Progress Notes (Signed)
 Patient ID: Lacey Sanders, female    DOB: 08/15/1985, 38 y.o.   MRN: [id number]   Assessment & Plan:  Pain in left shin -     DG Tibia/Fibula Left; Future  Other orders -     Meloxicam ; Take 1 tablet (15 mg total) by mouth daily. Take with food once daily as need for leg pain.  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 Pain is needle-like, radiates upwards, and is exacerbated by cold weather. No recent trauma reported. Pain impedes ambulation during flare-ups. Differential includes possible arthritis secondary to an old fracture or chip that was not previously evaluated. No prior imaging conducted. - Order x-ray of the left shin to evaluate for possible fracture or arthritis. - Prescribe meloxicam  for pain management during flare-ups, to be taken with food. - Advise that acetaminophen  can be taken concurrently with meloxicam , but avoid other pain relievers. - Discuss potential referral to sports medicine or orthopedics based on x-ray results.      F/up prn     Subjective:    Chief Complaint  Patient presents with   Leg Pain    Pt in office c/o leg pain; pt states 7 yrs ago was carrying daughter up steps and lost footing and since then has pain; pt states had wound at the time of fall and says all you could see was white. Pt didn't think it was a serious injury didn't seek care but for past year having pain, seen a doctor and was supposed to get a referral to have leg looked at but nothing was done. Pain started again last week with weakness and causing patient to fall.     Leg Pain    Discussed the use of AI scribe software for clinical note transcription with the patient, who gave verbal consent to proceed.  History of Present Illness Lacey Sanders is a 38 year old female who presents with worsening left shin pain following a fall seven years ago.  Due to language barrier, an interpreter was present during the history-taking and subsequent discussion (and for part of the physical exam) with this patient.   She has been experiencing worsening pain in her left shin, which she attributes to a fall that occurred seven years ago. During the initial incident, she was walking up concrete steps when she missed a step and fell forward, impacting her left shin and knee. The pain has been intermittent over the years but became unbearable last week.  The pain is described as a sensation of being poked with a needle in the bone, which then radiates upwards. It is exacerbated by cold weather, as experienced during a recent trip to [place] where she was exposed to cold air for an extended period. The pain affects her ability to walk, as she is unable to put weight on her left leg without risking a fall.  She has attempted to manage the pain with naproxen  for inflammation and Tylenol  for pain relief, as well as topical treatments. However, these measures provide only temporary relief, and the pain persists, sometimes lasting for two to three hours and affecting her sleep.  There has been no recent trauma or injury to the area, and she has not had any prior imaging studies such as x-rays, despite requesting  them in the past. The pain has not been evaluated by a specialist previously. There have been days where the pain radiates down and around, but the patient thinks it is stemming from the area on top.     Past Medical History:  Diagnosis Date   Anxiety    Fatigue    Frequent headaches    Heart burn    Joint pain    Medical history non-contributory     Past Surgical History:  Procedure Laterality Date   CESAREAN SECTION     WISDOM TOOTH EXTRACTION Bilateral     Family History  Problem Relation Age of Onset   Hyperlipidemia Mother     Asthma Father    Hypertension Father    Hypertension Sister    Hyperlipidemia Brother    Learning disabilities Brother    Hypertension Maternal Grandmother    High Cholesterol Maternal Grandmother    Diabetes Paternal Grandmother    Hypotension Paternal Grandmother    Hypertension Paternal Grandfather     Social History   Tobacco Use   Smoking status: Never   Smokeless tobacco: Never  Vaping Use   Vaping status: Never Used  Substance Use Topics   Alcohol use: Yes    Alcohol/week: 3.0 standard drinks of alcohol    Types: 2 Glasses of wine, 1 Shots of liquor per week    Comment: per month   Drug use: No     Allergies  Allergen Reactions   Bee Pollen     Review of Systems NEGATIVE UNLESS OTHERWISE INDICATED IN HPI      Objective:     BP 112/82 (BP Location: Left Arm, Patient Position: Sitting, Cuff Size: Normal)   Pulse 70   Temp 98.1 F (36.7 C) (Temporal)   Ht 5' 2 (1.575 m)   Wt 206 lb 9.6 oz (93.7 kg)   SpO2 98%   BMI 37.79 kg/m   Wt Readings from Last 3 Encounters:  09/30/23 206 lb 9.6 oz (93.7 kg)  09/01/23 204 lb 6.4 oz (92.7 kg)  07/21/23 206 lb 6.4 oz (93.6 kg)    BP Readings from Last 3 Encounters:  09/30/23 112/82  09/01/23 121/70  07/21/23 (!) 120/90     Physical Exam Vitals and nursing note reviewed.  Constitutional:      Appearance: Normal appearance.  Musculoskeletal:        General: Tenderness (proximal left shin) present. No swelling or deformity. Normal range of motion.     Right lower leg: No edema.     Left lower leg: No edema.  Skin:    General: Skin is warm.     Findings: No lesion or rash.  Neurological:     General: No focal deficit present.     Mental Status: She is alert.     Sensory: No sensory deficit.  Psychiatric:        Mood and Affect: Mood normal.             Miller Limehouse M Nayleah Gamel, PA-C

## 2023-10-02 ENCOUNTER — Ambulatory Visit: Payer: Self-pay | Admitting: Physician Assistant

## 2023-10-12 ENCOUNTER — Encounter: Payer: Self-pay | Admitting: Dermatology

## 2023-10-12 ENCOUNTER — Ambulatory Visit: Admitting: Dermatology

## 2023-10-12 VITALS — BP 128/70

## 2023-10-12 DIAGNOSIS — D225 Melanocytic nevi of trunk: Secondary | ICD-10-CM

## 2023-10-12 DIAGNOSIS — L918 Other hypertrophic disorders of the skin: Secondary | ICD-10-CM

## 2023-10-12 DIAGNOSIS — R234 Changes in skin texture: Secondary | ICD-10-CM | POA: Diagnosis not present

## 2023-10-12 DIAGNOSIS — L810 Postinflammatory hyperpigmentation: Secondary | ICD-10-CM

## 2023-10-12 DIAGNOSIS — D239 Other benign neoplasm of skin, unspecified: Secondary | ICD-10-CM

## 2023-10-12 MED ORDER — SAFETY SEAL MISCELLANEOUS MISC: 1.0000 | Freq: Two times a day (BID) | 1 refills | Status: AC

## 2023-10-12 NOTE — Progress Notes (Signed)
   New Patient Visit   Subjective  Lacey Sanders is a 38 y.o. female who presents for the following: Cutaneous Skin Tags  Multiple skin tags on the neck. PCP referred her 12/31/22. Skin tags were itchy and bothersome along her bra strap line. She recently went to [place] and had then frozen off. Multiple spots have started to grow back.   The following portions of the chart were reviewed this encounter and updated as appropriate: medications, allergies, medical history  Review of Systems:  No other skin or systemic complaints except as noted in HPI or Assessment and Plan.  Objective  Well appearing patient in no apparent distress; mood and affect are within normal limits.  A focused examination was performed of the following areas: Neck  Relevant exam findings are noted in the Assessment and Plan.    Assessment & Plan   1. Post-Inflammatory Hyperpigmentation - Assessment: Patient developed dark spots on her neck following skin tag removal in [place]. The spots have persisted for 6 months, consistent with post-inflammatory hyperpigmentation. This condition results from excessive pigment production by melanocytes after irritation from cold or heat. The persistence of the hyperpigmentation indicates a need for targeted treatment. - Plan:    Prescribe melasmic by [facility] with tranexamic acid    Apply every morning and evening    Recommend daily sunscreen application    Provide samples for patient to try    Educate patient on the importance of sun protection to prevent darkening of spots  2. Intradermal Nevus - Assessment: Patient reports a growing mole on the left upper chest. Upon examination, it appears to be an intradermal nevus without concerning features suggestive of malignancy. - Plan:    Schedule removal in 4 months    Instruct patient to remind assistant at next visit  3. Skin Tags - Assessment: Patient has a history of skin tag removal. New skin tag formation  is associated with weight gain and insulin  resistance. Patient is noted to be prediabetic, which increases the risk of developing additional skin tags. - Plan:    Refer to [facility] for support    Educate patient on lifestyle modifications    Manage diet    Reduce sugar and processed food intake    Increase exercise  4. Skin Texture Changes - Assessment: Patient reports concerns about skin texture changes. Treatment is recommended to address this concern. - Plan:    Provide samples of Avene Retinol    Apply every other night    Increase to nightly if no dryness after 2 weeks    Avoid application to eyelids    Educate patient on proper use and potential side effects  Follow-up in 4 months for nevus removal.   No follow-ups on file.  I, Gordan Beams, CMA, am acting as scribe for [employer], DO.   Documentation: I have reviewed the above documentation for accuracy and completeness, and I agree with the above.  Delon Lenis, DO

## 2023-10-12 NOTE — Patient Instructions (Addendum)
 Fecha: lunes, 25 de agosto de 2025  Hola Lacey Sanders:  Gracias por tu visita. Aqu tiene un resumen de las instrucciones clave:  - Medicamentos: - Aplique la crema aclaradora Melasmic de [employer] por la maana y por la noche. - Use Avene Retinol cada dos noches, aumentando a cada noche si no presenta sequedad despus de 2 semanas. - No aplicar en los prpados.  - Cuidado de la piel: - Use protector solar a diario. - Pruebe muestras de Patent examiner y compre el que prefiera en [employer], [facility] o [facility]. - Aplique el protector solar sobre la crema aclaradora.  - Seguimiento: - Regrese para una cita de seguimiento en 4 meses.  - Otras instrucciones: - Controle su dieta. - Reduzca el azcar y los alimentos procesados. - Haga ms ejercicio. - Asista a la clnica de Peso Saludable y [employer]ienest[employer] de [facility] para recibir apoyo. - Espere una llamada de la farmacia en [place]  para verificar la direccin y el pago de la crema Melasmic. - $200 por la eliminacin de hasta 15 verrugas cutneas si lo desea (requiere un depsito de $100).  Si tiene alguna pregunta o inquietud, contctenos.  Atentamente,  Dra. Delon Lenis Dermatologa     Date: Mon Oct 12 2023  Hello Lacey Sanders,  Thank you for visiting today. Here is a summary of the key instructions:  - Medications:   - Apply Melasmic lightening cream by [employer] every morning and evening   - Use Avene Retinol every other night, increasing to nightly if no dryness after 2 weeks   - Do not apply to eyelids  - Skin Care:   - Wear sunscreen daily   - Try sunscreen samples and purchase preferred one at [employer], [employer], or [facility]   - Apply sunscreen over lightening cream  - Follow-up:   - Return for follow-up appointment in 4 months   - Other Instructions:   - Manage diet   - Reduce sugar and processed foods   - Exercise more   - Attend [facility] for support   - Expect call from pharmacy in  [place]  to verify address and payment for melasmic cream   - $200 for up to 15 skin tag removals if desired (requires $100 deposit)  Please reach out if you have any questions or concerns.  Warm regards,  Dr. Delon Lenis Dermatology     Important Information  Due to recent changes in healthcare laws, you may see results of your pathology and/or laboratory studies on MyChart before the doctors have had a chance to review them. We understand that in some cases there may be results that are confusing or concerning to you. Please understand that not all results are received at the same time and often the doctors may need to interpret multiple results in order to provide you with the best plan of care or course of treatment. Therefore, we ask that you please give us  2 business days to thoroughly review all your results before contacting the office for clarification. Should we see a critical lab result, you will be contacted sooner.   If You Need Anything After Your Visit  If you have any questions or concerns for your doctor, please call our main line at [phone] If no one answers, please leave a voicemail as directed and we will return your call as soon as possible. Messages left after 4 pm will be answered the following business day.   You may also send us  a  message via MyChart. We typically respond to MyChart messages within 1-2 business days.  For prescription refills, please ask your pharmacy to contact our office. Our fax number is [phone].  If you have an urgent issue when the clinic is closed that cannot wait until the next business day, you can page your doctor at the number below.    Please note that while we do our best to be available for urgent issues outside of office hours, we are not available 24/7.   If you have an urgent issue and are unable to reach us , you may choose to seek medical care at your doctor's office, retail clinic, urgent care center, or  emergency room.  If you have a medical emergency, please immediately call 911 or go to the emergency department. In the event of inclement weather, please call our main line at [phone] for an update on the status of any delays or closures.  Dermatology Medication Tips: Please keep the boxes that topical medications come in in order to help keep track of the instructions about where and how to use these. Pharmacies typically print the medication instructions only on the boxes and not directly on the medication tubes.   If your medication is too expensive, please contact our office at [phone] or send us  a message through MyChart.   We are unable to tell what your co-pay for medications will be in advance as this is different depending on your insurance coverage. However, we may be able to find a substitute medication at lower cost or fill out paperwork to get insurance to cover a needed medication.   If a prior authorization is required to get your medication covered by your insurance company, please allow us  1-2 business days to complete this process.  Drug prices often vary depending on where the prescription is filled and some pharmacies may offer cheaper prices.  The website www.goodrx.com contains coupons for medications through different pharmacies. The prices here do not account for what the cost may be with help from insurance (it may be cheaper with your insurance), but the website can give you the price if you did not use any insurance.  - You can print the associated coupon and take it with your prescription to the pharmacy.  - You may also stop by our office during regular business hours and pick up a GoodRx coupon card.  - If you need your prescription sent electronically to a different pharmacy, notify our office through [facility] or by phone at [phone]

## 2023-10-22 ENCOUNTER — Other Ambulatory Visit: Payer: Self-pay | Admitting: Physician Assistant

## 2023-11-03 ENCOUNTER — Encounter (INDEPENDENT_AMBULATORY_CARE_PROVIDER_SITE_OTHER): Payer: Self-pay

## 2023-11-23 ENCOUNTER — Ambulatory Visit: Payer: Self-pay

## 2023-11-23 NOTE — Telephone Encounter (Signed)
 FYI Only or Action Required?: FYI only for provider.  Patient was last seen in primary care on 09/30/2023 by Allwardt, Mardy HERO, PA-C.  Called Nurse Triage reporting Numbness.  Symptoms began 3 to 4 weeks.  Interventions attempted: Nothing.  Symptoms are: gradually worsening.  Triage Disposition: See PCP When Office is Open (Within 3 Days)  Patient/caregiver understands and will follow disposition?: Yes    Interpreter Eric [phone]  Copied from CRM [id number]. Topic: Clinical - Red Word Triage >> Nov 23, 2023  2:30 PM Dedra B wrote: Red Word that prompted transfer to Nurse Triage: Pt having numbness in both arms and fatigue. Warm transfer to NT. Pt declined interpreter. Reason for Disposition  [1] Loss of speech or garbled speech AND [2] is a chronic symptom (recurrent or ongoing AND present > 4 weeks)  Answer Assessment - Initial Assessment Questions 1. SYMPTOM: What is the main symptom you are concerned about? (e.g., weakness, numbness)     Bilateral numbness in arms 2. ONSET: When did this start? (e.g., minutes, hours, days; while sleeping)     X 3 to 4 weeks 3. LAST NORMAL: When was the last time you (the patient) were normal (no symptoms)?     na 4. PATTERN Does this come and go, or has it been constant since it started?  Is it present now?     Comes and goes 5. CARDIAC SYMPTOMS: Have you had any of the following symptoms: chest pain, difficulty breathing, palpitations?     no 6. NEUROLOGIC SYMPTOMS: Have you had any of the following symptoms: headache, dizziness, vision loss, double vision, changes in speech, unsteady on your feet?     no 7. OTHER SYMPTOMS: Do you have any other symptoms?     Fatigue, bilateral arm tingling 8. PREGNANCY: Is there any chance you are pregnant? When was your last menstrual period?     Na  Numbness and tingling mostly when sleep and sometimes while sleep.  Pt also would like weight loss medication to help loss and  hopefully help lab results like ex for cholestrol  Protocols used: Neurologic Deficit-A-AH

## 2023-11-25 ENCOUNTER — Ambulatory Visit: Admitting: Physician Assistant

## 2023-11-25 ENCOUNTER — Encounter: Payer: Self-pay | Admitting: Physician Assistant

## 2023-11-25 VITALS — BP 118/70 | HR 77 | Temp 97.5°F | Ht 62.0 in | Wt 208.0 lb

## 2023-11-25 DIAGNOSIS — E88819 Insulin resistance, unspecified: Secondary | ICD-10-CM | POA: Diagnosis not present

## 2023-11-25 DIAGNOSIS — R202 Paresthesia of skin: Secondary | ICD-10-CM | POA: Diagnosis not present

## 2023-11-25 DIAGNOSIS — R21 Rash and other nonspecific skin eruption: Secondary | ICD-10-CM | POA: Diagnosis not present

## 2023-11-25 DIAGNOSIS — E785 Hyperlipidemia, unspecified: Secondary | ICD-10-CM | POA: Diagnosis not present

## 2023-11-25 DIAGNOSIS — E669 Obesity, unspecified: Secondary | ICD-10-CM | POA: Diagnosis not present

## 2023-11-25 DIAGNOSIS — M79601 Pain in right arm: Secondary | ICD-10-CM

## 2023-11-25 DIAGNOSIS — M79602 Pain in left arm: Secondary | ICD-10-CM

## 2023-11-25 DIAGNOSIS — K760 Fatty (change of) liver, not elsewhere classified: Secondary | ICD-10-CM | POA: Diagnosis not present

## 2023-11-25 LAB — LIPID PANEL
Cholesterol: 208 mg/dL — ABNORMAL HIGH (ref 0–200)
HDL: 47.6 mg/dL (ref >39.00)
NonHDL: 160.79
Total CHOL/HDL Ratio: 4
Triglycerides: 406 mg/dL — ABNORMAL HIGH (ref 0.0–149.0)
VLDL: 81.2 mg/dL — ABNORMAL HIGH (ref 0.0–40.0)

## 2023-11-25 LAB — LDL CHOLESTEROL, DIRECT: Direct LDL: 134 mg/dL

## 2023-11-25 LAB — HEMOGLOBIN A1C: Hgb A1c MFr Bld: 6.2 % (ref 4.6–6.5)

## 2023-11-25 NOTE — Progress Notes (Signed)
 Arelene Moroni is a 38 y.o. female here for multiple issues.  History of Present Illness:   Chief Complaint  Patient presents with   Numbness    Pt c/o bilateral arm numbness and tingling off and on x 3 weeks.   Rash    Pt c/o rash inner thigh since Friday, itching. Has tried Hydrocortisone.    Discussed the use of AI scribe software for clinical note transcription with the patient, who gave verbal consent to proceed.  History of Present Illness Shastina Rua is a 38 year old female with high cholesterol and fatty liver who presents with difficulty losing weight and tingling in her arms. She is accompanied by her husband and an in-person interpreter.   She is attempting to lose weight without success and is interested in injectable medications to aid in weight loss and manage her high cholesterol. She is concerned about the cost due to lack of insurance coverage. A previous ultrasound confirmed fatty liver.  She experiences tingling in her arms, particularly when driving or bending her arms for extended periods. The tingling is frequent but not constant. She sometimes experiences wrist pain and finds relief by shaking her arms. She drives approximately two hours a day.  A rash on left inner thigh appeared since Friday, initially larger and itchy, but has reduced in size with hydrocortisone cream and another cream for insect bites. The rash is not painful and localized to one area.  Her care team had concerns for polycystic ovary syndrome and was previously prescribed birth control pills, which she is reluctant to take. Her last A1c was in the prediabetic range. She plans to have her A1c and cholesterol rechecked. She is not planning to have more children.    Past Medical History:  Diagnosis Date   Anxiety    Fatigue    Frequent headaches    Heart burn    Joint pain    Medical history non-contributory      Social History   Tobacco Use   Smoking status:  Never   Smokeless tobacco: Never  Vaping Use   Vaping status: Never Used  Substance Use Topics   Alcohol use: Yes    Alcohol/week: 3.0 standard drinks of alcohol    Types: 2 Glasses of wine, 1 Shots of liquor per week    Comment: per month   Drug use: No    Past Surgical History:  Procedure Laterality Date   CESAREAN SECTION     WISDOM TOOTH EXTRACTION Bilateral     Family History  Problem Relation Age of Onset   Hyperlipidemia Mother    Asthma Father    Hypertension Father    Hypertension Sister    Hyperlipidemia Brother    Learning disabilities Brother    Hypertension Maternal Grandmother    High Cholesterol Maternal Grandmother    Diabetes Paternal Grandmother    Hypotension Paternal Grandmother    Hypertension Paternal Grandfather     Allergies  Allergen Reactions   Bee Pollen     Current Medications:   Current Outpatient Medications:    albuterol  (VENTOLIN  HFA) 108 (90 Base) MCG/ACT inhaler, Inhale 2 puffs into the lungs every 6 (six) hours as needed for wheezing or shortness of breath., Disp: 1 each, Rfl: 2   calcium carbonate (OS-CAL) 1250 (500 Ca) MG chewable tablet, Chew 1 tablet by mouth daily., Disp: , Rfl:    Magnesium 250 MG CAPS, Take 250 mg by mouth daily., Disp: , Rfl:  meloxicam  (MOBIC ) 15 MG tablet, Take 1 tablet (15 mg total) by mouth daily. Take with food once daily as need for leg pain., Disp: 30 tablet, Rfl: 2   Multiple Vitamin (MULTIVITAMIN WITH MINERALS) TABS tablet, Take 1 tablet by mouth daily., Disp: , Rfl:    Potassium 99 MG TABS, Take by mouth., Disp: , Rfl:    Safety Seal Miscellaneous MISC, Apply 1 Application topically in the morning and at bedtime. Melaxemic Cream: Tranexamic Acid 5%, Koic Acid 2%, Vit C USP 2.5%, Hylauronic Acid EXCP 0.1% Apply topically morning and night, Disp: 15 g, Rfl: 1   norethindrone  (MICRONOR ) 0.35 MG tablet, Take 1 tablet (0.35 mg total) by mouth daily. (Patient not taking: Reported on 11/25/2023), Disp: 84  tablet, Rfl: 1   Vitamin D , Ergocalciferol , (DRISDOL ) 1.25 MG (50000 UNIT) CAPS capsule, Take 1 capsule (50,000 Units total) by mouth every 7 (seven) days., Disp: 12 capsule, Rfl: 0   Review of Systems:   Negative unless otherwise specified per HPI.  Vitals:   Vitals:   11/25/23 1004  BP: 118/70  Pulse: 77  Temp: (!) 97.5 F (36.4 C)  TempSrc: Temporal  SpO2: 99%  Weight: 208 lb (94.3 kg)  Height: 5' 2 (1.575 m)     Body mass index is 38.04 kg/m.  Physical Exam:   Physical Exam Vitals and nursing note reviewed.  Constitutional:      General: She is not in acute distress.    Appearance: She is well-developed. She is not ill-appearing or toxic-appearing.  Cardiovascular:     Rate and Rhythm: Normal rate and regular rhythm.     Pulses: Normal pulses.     Heart sounds: Normal heart sounds, S1 normal and S2 normal.  Pulmonary:     Effort: Pulmonary effort is normal.     Breath sounds: Normal breath sounds.  Musculoskeletal:     Comments: No decreased ROM 2/2 pain with flexion/extension, lateral side bends, or rotation. Reproducible tenderness with deep palpation to bilateral cervical paraspinal muscles. No bony tenderness.   Normal range of motion of bilateral wrists/arms Negative tinels and phalens Negative finklesteins  Skin:    General: Skin is warm and dry.     Comments: Right upper inner thigh with small erythematous patch  Neurological:     Mental Status: She is alert.     GCS: GCS eye subscore is 4. GCS verbal subscore is 5. GCS motor subscore is 6.     Comments: Normal sensation to bilateral arms and hands Grip strength 5/5  Psychiatric:        Speech: Speech normal.        Behavior: Behavior normal. Behavior is cooperative.     Assessment and Plan:   Assessment and Plan Assessment & Plan Obesity  Insurance no longer covers injectable weight loss medications for Medicaid patients. Oral medications may soon be available and more affordable. Oral  options may be better tolerated due to daily dosing and lower potency. - Consider oral weight loss medications when available. - Discuss potential side effects, including constipation and nausea.  Fatty liver (hepatic steatosis) Fatty liver confirmed by previous ultrasound. Georjean has a new indication for fatty liver, but approval criteria are unclear. - Follow up in 1-2 months regarding Wegovy's new indication for fatty liver with Primary Care Provider (PCP) to see if there are updated guidelines on prescribing for this indication - recommend follow up with Primary Care Provider (PCP) for management of this chronic issue  Insulin  resistance Previous  A1c in prediabetic range. Metformin discussed as a treatment option. - Order blood work to recheck A1c levels. - Consider metformin if A1c remains in prediabetic range.  Hyperlipidemia, unspecified hld type Cholesterol levels do not currently require medication. Weight loss advised to manage cholesterol. - Order blood work to recheck cholesterol levels.  Paresthesia and pain of both upper extremities Intermittent arm pain and paresthesia, possibly related to prolonged arm bending or vibration. Carpal tunnel syndrome and neck issues considered. - No red flag symptom(s)  - Consider physical therapy for arm pain and paresthesia. - Consider neck x-ray if symptoms persist.  Rash and nonspecific skin eruption Localized rash improved with hydrocortisone, unlikely to be ringworm or fungal infection. - Continue hydrocortisone cream morning and night. - follow up if no improvement  I personally spent a total of 43 minutes in the care of the patient today including preparing to see the patient, getting/reviewing separately obtained history, performing a medically appropriate exam/evaluation, counseling and educating, placing orders, and documenting clinical information in the EHR.    Lucie Buttner, PA-C

## 2023-11-25 NOTE — Patient Instructions (Addendum)
  VISIT SUMMARY: During your visit, we discussed your challenges with weight loss, tingling in your arms, and a recent rash. We also reviewed your conditions of high cholesterol, fatty liver, and prediabetes.  YOUR PLAN: OBESITY AND WEIGHT MANAGEMENT: You are having difficulty losing weight and are interested in injectable medications, but insurance does not cover them. -Consider oral weight loss medications when they become available. -Be aware of potential side effects like constipation and nausea.  FATTY LIVER (HEPATIC STEATOSIS): You have a confirmed fatty liver from a previous ultrasound. -Follow up in 1-2 months regarding Wegovy's new indication for fatty liver.  PREDIABETES: Your last A1c was in the prediabetic range. -We will order blood work to recheck your A1c levels. -Consider metformin if your A1c remains in the prediabetic range.  HYPERCHOLESTEROLEMIA: Your cholesterol levels do not currently require medication. -We will order blood work to recheck your cholesterol levels.  ARM PAIN AND INTERMITTENT PARESTHESIA: You experience tingling in your arms, possibly related to prolonged arm bending or vibration. -Consider physical therapy for your arm pain and tingling. -Consider a neck x-ray if symptoms persist.  LOCALIZED SKIN RASH: You have a localized rash that has improved with hydrocortisone cream. -Continue using hydrocortisone cream morning and night.                      Contains text generated by Abridge.                                 Contains text generated by [employer]bridge.

## 2023-11-26 ENCOUNTER — Ambulatory Visit: Payer: Self-pay | Admitting: Physician Assistant

## 2023-11-28 IMAGING — DX DG CHEST 2V
2 series · 2 of 2 positions shown · non-contrast
Comparison: Portable chest 05/29/2014.

CLINICAL DATA: 35-year-old female with chest pain.

EXAM:
CHEST - 2 VIEW

[chest pa]
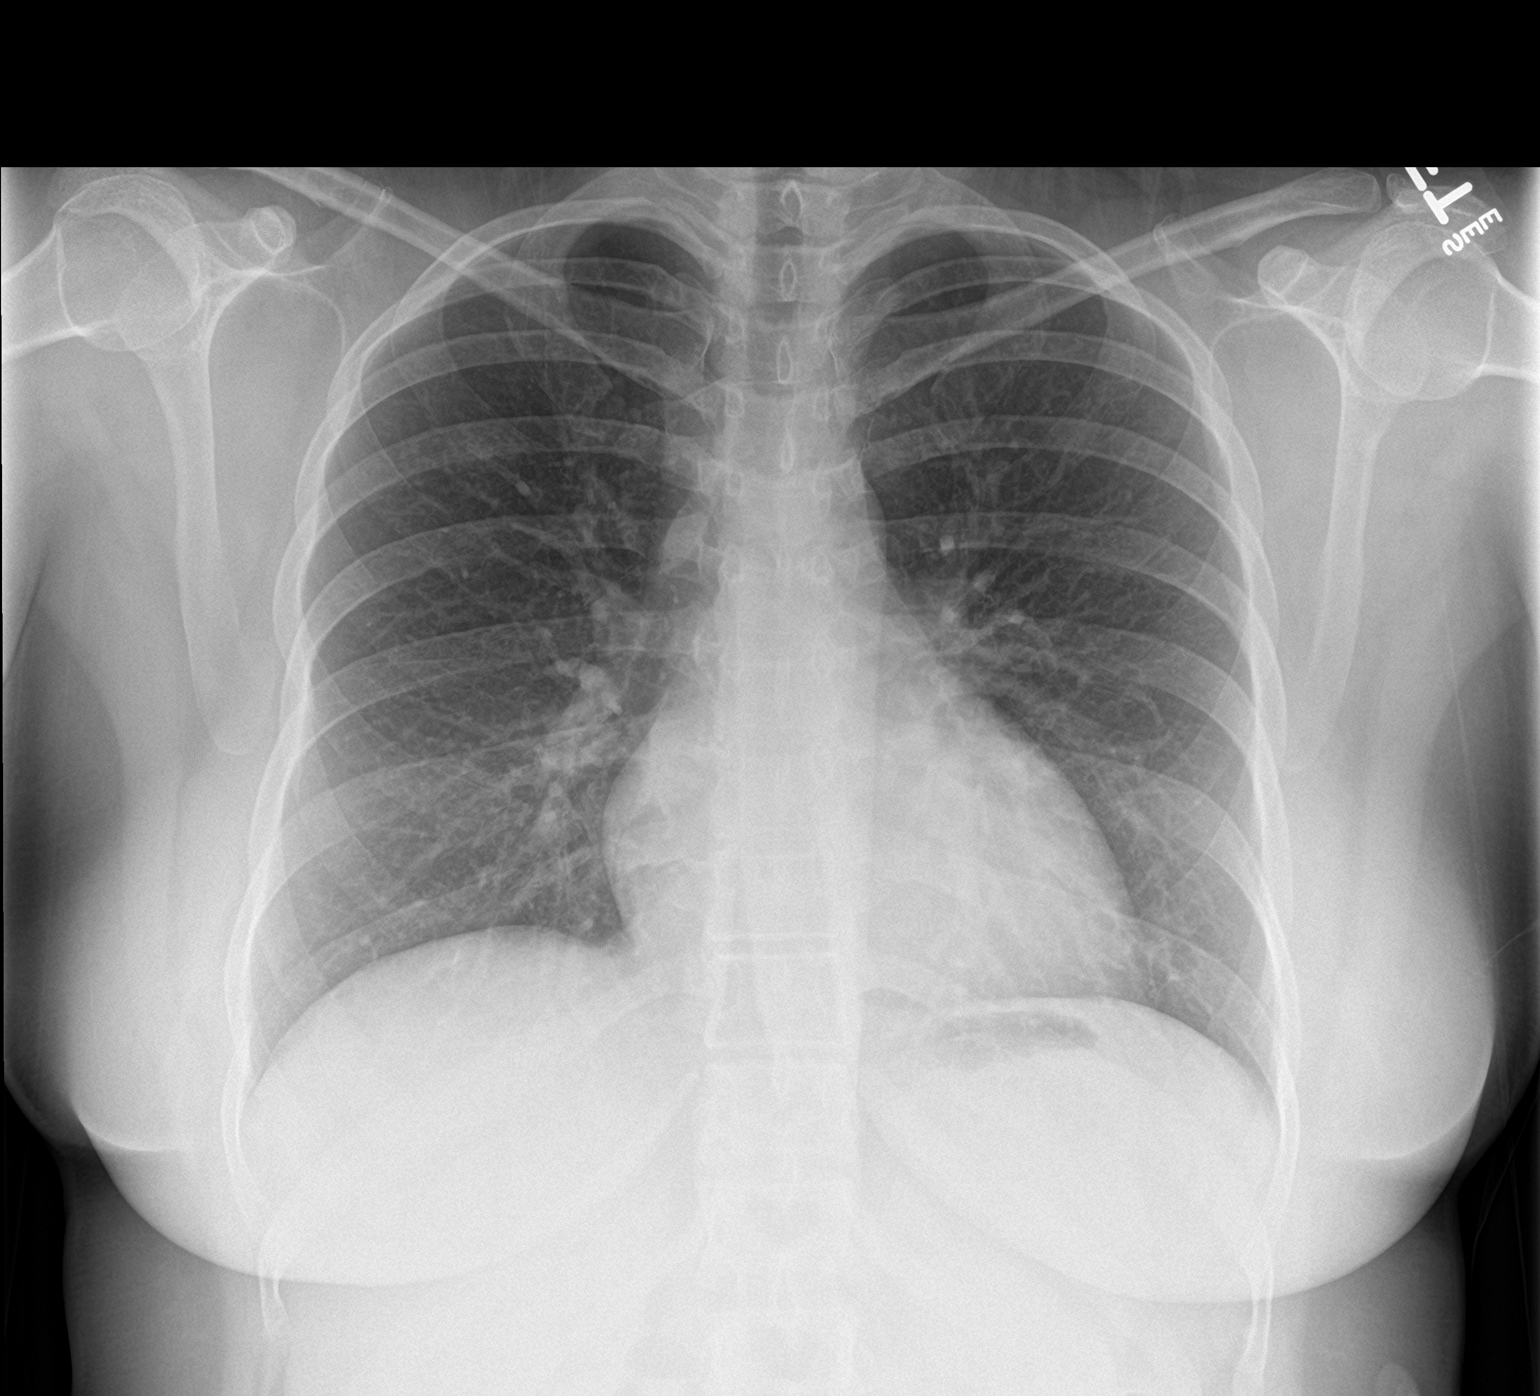

[chest lat]
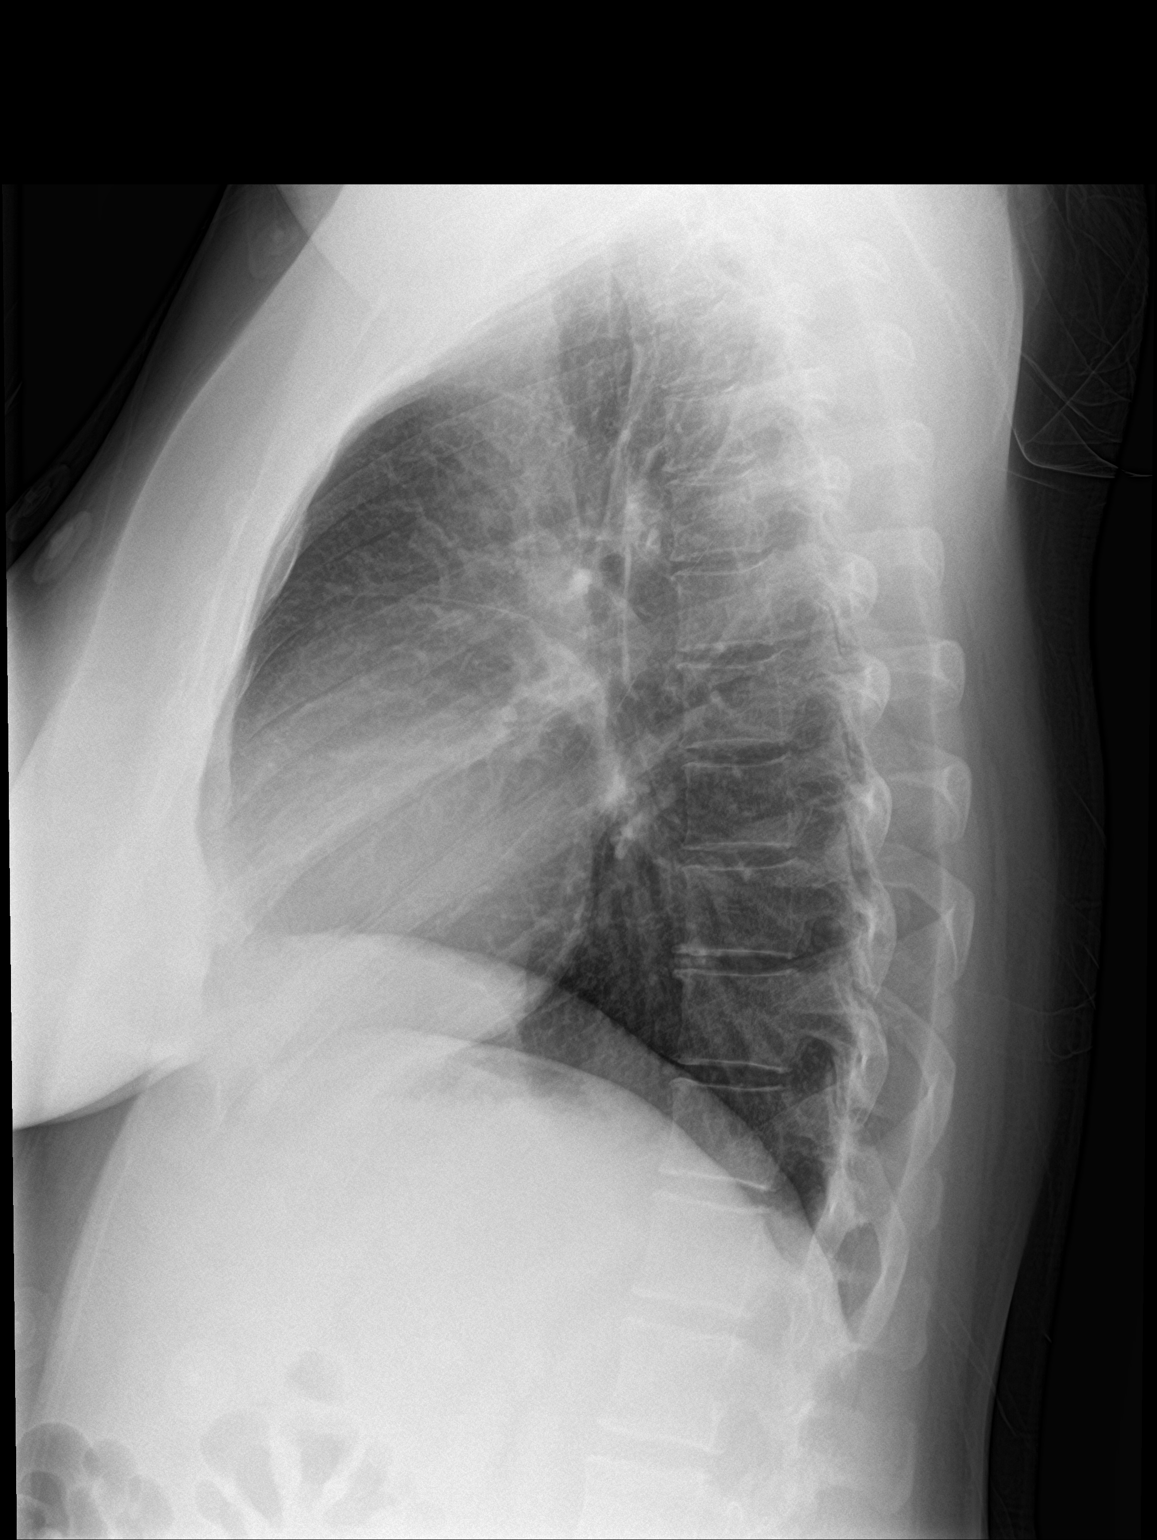

[2 of 2 positions shown; findings below may reference images not displayed]

FINDINGS: Lung volumes and mediastinal contours remain normal. Visualized
tracheal air column is within normal limits. Both lungs appear
stable and clear. No pneumothorax. No osseous abnormality
identified. Negative visible bowel gas.
IMPRESSION: Negative.  No cardiopulmonary abnormality.

## 2024-01-04 ENCOUNTER — Ambulatory Visit: Payer: Self-pay

## 2024-01-04 NOTE — Telephone Encounter (Signed)
  Lacey Sanders #[id number] Spanish interpreter   Patient denies any chest pain, difficulty breathing, swelling of face or throat, or any noted swelling anywhere. Patient states that she took Zepbound Wednesday night and states that her face was swollen Thursday morning when she woke up but that has since gone down.    FYI Only or Action Required?: Action required by provider: clinical question for provider, update on patient condition, and question about possible side effects from starting a medication.  Patient was last seen in primary care on 11/25/2023 by Job Lukes, PA.  Called Nurse Triage reporting Medication Reaction.  Symptoms began last week after starting Zepbound.  Interventions attempted: Nothing.  Symptoms are: gradually improving.  Triage Disposition: Call PCP When Office is Open  Patient/caregiver understands and will follow disposition?: No, wishes to speak with PCP                  Copied from CRM #[id number]. Topic: Clinical - Red Word Triage >> Jan 04, 2024 11:51 AM Adelita E wrote: [facility] that prompted transfer to Nurse Triage: Medication reaction from Zepbound. Patient took first dose last week Wednesday and was having throat pain, specifically when she goes outside, along with skin sensitivity on arms that are also warm to touch, and headaches. Patient also vomited x2 with fatigue. Symptoms have been going on for 2 days. Reason for Disposition  [1] Caller has NON-URGENT medicine question about med that PCP prescribed AND [2] triager unable to answer question  Answer Assessment - Initial Assessment Questions Lacey Sanders [phone] interpreter  Patient started taking Zepbound Vomited twice Headache for two days Now feeling better No headache Still feels body aches---very mild she states Smell of food gives her nausea now but no vomiting Patient denies fever---felt warm but no fever Just feeling tired and body aches are almost gone---just mildly in the  legs Skin and face feel tender She states she feels better but wants to check about if she is going to be able to take the second dose or if she is having a bad reaction to the medication. Patient denies any chest pain, difficulty breathing, swelling of face or throat, or any noted swelling anywhere. Patient states that she took Zepbound Wednesday night and states that her face was swollen Thursday morning when she woke up but that has since gone down. Patient states she feels okay right now but just wants to know if she is okay to take the next dose--offered an appointment but she just wanted to find out her PCP's recommendation first  Patient is advised to call us  back if anything changes or with any further questions/concerns. Patient is advised that if anything worsens to go to the Emergency Room. Patient verbalized understanding.      5. PREGNANCY:  Is there any chance that you are pregnant? When was your last menstrual period?     Not 100% sure but I just had my period  Protocols used: Medication Question Call-A-AH

## 2024-01-04 NOTE — Telephone Encounter (Signed)
 Please see triage note and advise recommendations or if appt is needed

## 2024-01-05 ENCOUNTER — Ambulatory Visit (INDEPENDENT_AMBULATORY_CARE_PROVIDER_SITE_OTHER): Admitting: Family Medicine

## 2024-01-05 ENCOUNTER — Encounter: Payer: Self-pay | Admitting: Family Medicine

## 2024-01-05 VITALS — BP 130/76 | HR 77 | Temp 98.2°F | Wt 205.2 lb

## 2024-01-05 DIAGNOSIS — R400 Somnolence: Secondary | ICD-10-CM

## 2024-01-05 DIAGNOSIS — R110 Nausea: Secondary | ICD-10-CM | POA: Diagnosis not present

## 2024-01-05 NOTE — Patient Instructions (Signed)
 Do not take any more doses of Zepbound for now  Keep follow up with Alyssa to discuss possible medication options.

## 2024-01-05 NOTE — Telephone Encounter (Signed)
 Please contact patient and schedule for OV with any provider with availability

## 2024-01-05 NOTE — Progress Notes (Signed)
 Established Patient Office Visit  Subjective   Patient ID: Lacey Sanders, female    DOB: Oct 25, 1985  Age: 38 y.o. MRN: [id number]  Chief Complaint  Patient presents with   Medication Reaction    HPI   Lacey Sanders is seen with symptoms possibly related to adverse medication reaction.  She states that a friend of hers had some Zepbound and had offered her to take this last week.  Last Wednesday she took Zepbound 2.5 mg and by Wednesday night she had noticed some nausea.  By Thursday she had headache, body aches and worsening nausea with 2 episodes of vomiting.  She also noticed increased skin sensitivity but no rash.  No documented fever.  Her nausea lasted about 5 or 6 days but is finally resolved.  She did have some early satiety.  She is aware that several if not all the symptoms may have been related to the Zepbound.  She denies trying any other GLP-1 medications previously.  She has history of obesity, GERD, prediabetes with recent A1c 6.2%.  She also relates frequently feeling sedated and sleepy during the day and wonders if she may have obstructive sleep apnea.  No history of observed apnea.  Past Medical History:  Diagnosis Date   Anxiety    Fatigue    Frequent headaches    Heart burn    Joint pain    Medical history non-contributory    Past Surgical History:  Procedure Laterality Date   CESAREAN SECTION     WISDOM TOOTH EXTRACTION Bilateral     reports that she has never smoked. She has never used smokeless tobacco. She reports current alcohol use of about 3.0 standard drinks of alcohol per week. She reports that she does not use drugs. family history includes Asthma in her father; Diabetes in her paternal grandmother; High Cholesterol in her maternal grandmother; Hyperlipidemia in her brother and mother; Hypertension in her father, maternal grandmother, paternal grandfather, and sister; Hypotension in her paternal grandmother; Learning disabilities in her  brother. Allergies  Allergen Reactions   Bee Pollen     Review of Systems  Constitutional:  Positive for malaise/fatigue. Negative for chills and fever.  Respiratory:  Negative for cough and shortness of breath.   Cardiovascular:  Negative for chest pain.  Gastrointestinal:  Positive for nausea. Negative for abdominal pain.  Skin:  Negative for rash.      Objective:     BP 130/76   Pulse 77   Temp 98.2 F (36.8 C) (Oral)   Wt 205 lb 3.2 oz (93.1 kg)   SpO2 98%   BMI 37.53 kg/m  BP Readings from Last 3 Encounters:  01/05/24 130/76  11/25/23 118/70  10/12/23 128/70   Wt Readings from Last 3 Encounters:  01/05/24 205 lb 3.2 oz (93.1 kg)  11/25/23 208 lb (94.3 kg)  09/30/23 206 lb 9.6 oz (93.7 kg)      Physical Exam Vitals reviewed.  Constitutional:      General: She is not in acute distress.    Appearance: She is not ill-appearing.  Cardiovascular:     Rate and Rhythm: Normal rate and regular rhythm.  Pulmonary:     Effort: Pulmonary effort is normal.     Breath sounds: Normal breath sounds. No wheezing or rales.  Abdominal:     Palpations: Abdomen is soft.     Tenderness: There is no abdominal tenderness.  Skin:    Findings: No rash.  Neurological:     Mental  Status: She is alert.      No results found for any visits on 01/05/24.    The ASCVD Risk score (Arnett DK, et al., 2019) failed to calculate for the following reasons:   The 2019 ASCVD risk score is only valid for ages 3 to 81    Assessment & Plan:   #1 multiple recent symptoms including nausea, body aches, early satiety probably related to Zepbound.  She took this at a dosage of 2.5 mg subcutaneous last Wednesday from a friend's medication.  We have recommend she take no further Zepbound and she has already scheduled follow-up with her primary early December and discuss other potential weight loss options then.  If GLP-1's are considered an option she may have better tolerance to another  GLP-1 such as Wegovy.  She has no abdominal pain or other symptoms to suggest acute pancreatitis.  #2 daytime fatigue and somnolence.  We discussed with patient that she may want to discuss with her primary potential evaluation for obstructive sleep apnea  Wolm Scarlet, MD

## 2024-01-05 NOTE — Telephone Encounter (Signed)
 Pt is scheduled for today at 4:30 with Burchette at the [facility] office

## 2024-01-20 ENCOUNTER — Ambulatory Visit: Admitting: Physician Assistant

## 2024-01-20 ENCOUNTER — Encounter: Payer: Self-pay | Admitting: Physician Assistant

## 2024-01-20 VITALS — BP 124/68 | HR 66 | Temp 98.1°F | Ht 62.0 in | Wt 202.6 lb

## 2024-01-20 DIAGNOSIS — N926 Irregular menstruation, unspecified: Secondary | ICD-10-CM | POA: Diagnosis not present

## 2024-01-20 DIAGNOSIS — R7303 Prediabetes: Secondary | ICD-10-CM

## 2024-01-20 DIAGNOSIS — E782 Mixed hyperlipidemia: Secondary | ICD-10-CM | POA: Diagnosis not present

## 2024-01-20 DIAGNOSIS — R635 Abnormal weight gain: Secondary | ICD-10-CM

## 2024-01-20 LAB — LIPID PANEL
Cholesterol: 181 mg/dL (ref 0–200)
HDL: 47.2 mg/dL (ref >39.00)
LDL Cholesterol: 97 mg/dL (ref 0–99)
NonHDL: 133.54
Total CHOL/HDL Ratio: 4
Triglycerides: 185 mg/dL — ABNORMAL HIGH (ref 0.0–149.0)
VLDL: 37 mg/dL (ref 0.0–40.0)

## 2024-01-20 LAB — POCT URINE PREGNANCY: Preg Test, Ur: NEGATIVE

## 2024-01-20 MED ORDER — METFORMIN HCL ER 500 MG PO TB24: 500.0000 mg | ORAL_TABLET | Freq: Every day | ORAL | 2 refills | Status: AC

## 2024-01-20 NOTE — Progress Notes (Unsigned)
 Patient ID: Lacey Sanders, female    DOB: 29-May-1985, 38 y.o.   MRN: [id number]   Assessment & Plan:  Prediabetes -     metFORMIN  HCl ER; Take 1 tablet (500 mg total) by mouth daily with supper.  Dispense: 30 tablet; Refill: 2  Elevated triglycerides with high cholesterol -     Lipid panel  Irregular menstrual cycle -     POCT urine pregnancy  Abnormal weight gain -     metFORMIN  HCl ER; Take 1 tablet (500 mg total) by mouth daily with supper.  Dispense: 30 tablet; Refill: 2     Assessment & Plan Prediabetes Confirmed with elevated A1c consistent with prediabetes. Discussed metformin  for insulin  resistance and weight management. Emphasized lifestyle modifications including increased physical activity and dietary changes. Discussed potential for weight loss medications (GLP-1 agonists), noting insurance limitations and cost considerations. Explained metformin 's mechanism in reducing blood sugar levels by affecting the pancreas and liver. - Started metformin , one tablet with dinner. - Encouraged increased physical activity and dietary modifications, including reducing processed foods and sugary drinks. - Will reassess in three months.  Mixed hyperlipidemia Elevated triglycerides and cholesterol. Previous lipid panel was non-fasting, affecting results. Discussed importance of fasting before lipid panel for accurate results. - Ordered fasting lipid panel today.  Irregular menstruation with suspected polycystic ovarian syndrome Irregular menstruation with suspected PCOS. Previous gynecological evaluation suggested PCOS. Discussed potential symptoms of PCOS including weight gain, fatigue, and hair growth. Metformin  may help regulate menstrual cycles and manage PCOS symptoms. - Checked urine for pregnancy. - Started metformin  to help regulate menstrual cycles and manage PCOS symptoms.  Abnormal weight gain Potentially related to PCOS and prediabetes. Discussed weight  management options including metformin  and potential future availability of weight loss medications. Emphasized lifestyle modifications for weight management. Discussed cost and insurance coverage challenges for weight loss medications, including self-pay options for Zepbound. - Started metformin  to aid in weight management. - Encouraged lifestyle modifications including diet and exercise. - Will monitor weight and reassess in three months.      Return in about 3 months (around 04/19/2024) for recheck/follow-up - interpreter needed.    Subjective:    Chief Complaint  Patient presents with   Follow-up    Follow-up for A1C and high cholesterol. Concerned about weight, wants to try taking shots to help lose weight.    Obesity    Tried her friends zepbound 0.5. skin was sensitive for 10 days, had nausea, and neck sensitivity.    Hyperlipidemia   Menstrual Problem    Having irregulare periods. Twos day off for five then one day.     HPI Discussed the use of AI scribe software for clinical note transcription with the patient, who gave verbal consent to proceed.  History of Present Illness Lacey Sanders is a 38 year old female with prediabetes who presents for follow-up on her A1c and lipid panel. Due to language barrier, an interpreter was present during the history-taking and subsequent discussion (and for part of the physical exam) with this patient.  She is following up on her A1c and lipid panel after being identified as prediabetic with high triglycerides and cholesterol. She was supposed to do a fasting lipid panel, but she had coffee before the test today.  She experiences irregular menstrual periods, with the last period starting on November 11th, lasting only two days, stopping, and then resuming lightly. She is concerned about a possible pregnancy and requests a urine test to  rule it out.  She is interested in weight loss medication, having tried a friend's  Zepbound shot previously. She is exploring medication options for weight loss but notes that her insurance does not cover the shots.  She reported pain and fainting episodes two years ago, and a cyst was found at that time. She experiences fatigue, difficulty losing weight, and occasional excessive hair growth, consistent with PCOS symptoms.  She feels very tired and lacks energy, impacting her ability to exercise. However, she noted some improvement in her energy levels and weight after the Zepbound shot.     Past Medical History:  Diagnosis Date   Anxiety    Fatigue    Frequent headaches    Heart burn    Joint pain    Medical history non-contributory     Past Surgical History:  Procedure Laterality Date   CESAREAN SECTION     WISDOM TOOTH EXTRACTION Bilateral     Family History  Problem Relation Age of Onset   Hyperlipidemia Mother    Asthma Father    Hypertension Father    Hypertension Sister    Hyperlipidemia Brother    Learning disabilities Brother    Hypertension Maternal Grandmother    High Cholesterol Maternal Grandmother    Diabetes Paternal Grandmother    Hypotension Paternal Grandmother    Hypertension Paternal Grandfather     Social History   Tobacco Use   Smoking status: Never   Smokeless tobacco: Never  Vaping Use   Vaping status: Never Used  Substance Use Topics   Alcohol use: Yes    Alcohol/week: 3.0 standard drinks of alcohol    Types: 2 Glasses of wine, 1 Shots of liquor per week    Comment: per month   Drug use: No     Allergies  Allergen Reactions   Bee Pollen     Review of Systems NEGATIVE UNLESS OTHERWISE INDICATED IN HPI      Objective:     BP 124/68   Pulse 66   Temp 98.1 F (36.7 C) (Temporal)   Ht 5' 2 (1.575 m)   Wt 202 lb 9.6 oz (91.9 kg)   LMP 12/29/2023   SpO2 98%   BMI 37.06 kg/m   Wt Readings from Last 3 Encounters:  01/20/24 202 lb 9.6 oz (91.9 kg)  01/05/24 205 lb 3.2 oz (93.1 kg)  11/25/23 208 lb  (94.3 kg)    BP Readings from Last 3 Encounters:  01/20/24 124/68  01/05/24 130/76  11/25/23 118/70     Physical Exam Vitals and nursing note reviewed.  Constitutional:      Appearance: Normal appearance. She is obese. She is not toxic-appearing.  HENT:     Head: Normocephalic and atraumatic.     Right Ear: External ear normal.     Left Ear: External ear normal.  Eyes:     Extraocular Movements: Extraocular movements intact.     Conjunctiva/sclera: Conjunctivae normal.     Pupils: Pupils are equal, round, and reactive to light.  Cardiovascular:     Rate and Rhythm: Normal rate and regular rhythm.     Pulses: Normal pulses.     Heart sounds: Normal heart sounds.  Pulmonary:     Effort: Pulmonary effort is normal.     Breath sounds: Normal breath sounds.  Musculoskeletal:        General: Normal range of motion.     Cervical back: Normal range of motion and neck supple.  Skin:  General: Skin is warm and dry.  Neurological:     General: No focal deficit present.     Mental Status: She is alert and oriented to person, place, and time.  Psychiatric:        Mood and Affect: Mood normal.        Behavior: Behavior normal.             Skylan Lara M Lenise Jr, PA-C

## 2024-01-21 ENCOUNTER — Ambulatory Visit: Payer: Self-pay | Admitting: Physician Assistant

## 2024-01-21 NOTE — Patient Instructions (Signed)
  VISIT SUMMARY: Today, we discussed your prediabetes, high cholesterol, irregular menstrual periods, and weight management. We started you on metformin to help manage your blood sugar, cholesterol, and symptoms of PCOS. We also talked about lifestyle changes to improve your overall health.  YOUR PLAN: PREDIABETES: Your A1c levels indicate prediabetes, which means your blood sugar levels are higher than normal but not yet high enough to be classified as diabetes. -Start taking metformin, one tablet with dinner. -Increase physical activity and make dietary changes, such as reducing processed foods and sugary drinks. -We will reassess your condition in three months.  MIXED HYPERLIPIDEMIA: Your triglycerides and cholesterol levels are elevated. The previous lipid panel was non-fasting, which can affect the results. -We ordered a fasting lipid panel today to get accurate results.  IRREGULAR MENSTRUATION WITH SUSPECTED POLYCYSTIC OVARIAN SYNDROME (PCOS): You have irregular menstrual periods and symptoms that suggest PCOS, such as weight gain, fatigue, and hair growth. -We checked your urine for pregnancy. -Start taking metformin to help regulate your menstrual cycles and manage PCOS symptoms.  ABNORMAL WEIGHT GAIN: Your weight gain may be related to PCOS and prediabetes. -Start taking metformin to help with weight management. -Make lifestyle changes including diet and exercise. -We will monitor your weight and reassess in three months.                      Contains text generated by [employer]bridge.                                 Contains text generated by [employer]bridge.

## 2024-02-16 ENCOUNTER — Other Ambulatory Visit: Payer: Self-pay | Admitting: Physician Assistant

## 2024-02-16 DIAGNOSIS — R7303 Prediabetes: Secondary | ICD-10-CM

## 2024-02-16 DIAGNOSIS — R635 Abnormal weight gain: Secondary | ICD-10-CM

## 2024-02-22 ENCOUNTER — Other Ambulatory Visit: Payer: Self-pay

## 2024-02-22 ENCOUNTER — Encounter (HOSPITAL_COMMUNITY): Payer: Self-pay

## 2024-02-22 ENCOUNTER — Emergency Department (HOSPITAL_COMMUNITY)

## 2024-02-22 ENCOUNTER — Ambulatory Visit: Payer: Self-pay

## 2024-02-22 ENCOUNTER — Emergency Department (HOSPITAL_COMMUNITY)
Admission: EM | Admit: 2024-02-22 | Discharge: 2024-02-23 | Discharge status: Against Medical Advice | Attending: Emergency Medicine | Admitting: Emergency Medicine

## 2024-02-22 ENCOUNTER — Ambulatory Visit: Payer: Self-pay | Admitting: *Deleted

## 2024-02-22 DIAGNOSIS — Z5321 Procedure and treatment not carried out due to patient leaving prior to being seen by health care provider: Secondary | ICD-10-CM | POA: Insufficient documentation

## 2024-02-22 DIAGNOSIS — R55 Syncope and collapse: Secondary | ICD-10-CM | POA: Insufficient documentation

## 2024-02-22 DIAGNOSIS — R0981 Nasal congestion: Secondary | ICD-10-CM | POA: Insufficient documentation

## 2024-02-22 DIAGNOSIS — R059 Cough, unspecified: Secondary | ICD-10-CM | POA: Diagnosis not present

## 2024-02-22 DIAGNOSIS — R0602 Shortness of breath: Secondary | ICD-10-CM | POA: Diagnosis not present

## 2024-02-22 LAB — CBC
HCT: 40.9 % (ref 36.0–46.0)
Hemoglobin: 13.6 g/dL (ref 12.0–15.0)
MCH: 30 pg (ref 26.0–34.0)
MCHC: 33.3 g/dL (ref 30.0–36.0)
MCV: 90.1 fL (ref 80.0–100.0)
Platelets: 325 K/uL (ref 150–400)
RBC: 4.54 MIL/uL (ref 3.87–5.11)
RDW: 12.7 % (ref 11.5–15.5)
WBC: 10.7 K/uL — ABNORMAL HIGH (ref 4.0–10.5)
nRBC: 0 % (ref 0.0–0.2)

## 2024-02-22 LAB — BASIC METABOLIC PANEL WITH GFR
Anion gap: 10 (ref 5–15)
BUN: 10 mg/dL (ref 6–20)
CO2: 26 mmol/L (ref 22–32)
Calcium: 9.5 mg/dL (ref 8.9–10.3)
Chloride: 105 mmol/L (ref 98–111)
Creatinine, Ser: 0.62 mg/dL (ref 0.44–1.00)
GFR, Estimated: >60 mL/min
Glucose, Bld: 107 mg/dL — ABNORMAL HIGH (ref 70–99)
Potassium: 3.7 mmol/L (ref 3.5–5.1)
Sodium: 140 mmol/L (ref 135–145)

## 2024-02-22 LAB — RESP PANEL BY RT-PCR (RSV, FLU A&B, COVID)  RVPGX2
Influenza A by PCR: NEGATIVE
Influenza B by PCR: NEGATIVE
Resp Syncytial Virus by PCR: NEGATIVE
SARS Coronavirus 2 by RT PCR: NEGATIVE

## 2024-02-22 LAB — CBG MONITORING, ED: Glucose-Capillary: 112 mg/dL — ABNORMAL HIGH (ref 70–99)

## 2024-02-22 LAB — HCG, SERUM, QUALITATIVE: Preg, Serum: NEGATIVE

## 2024-02-22 NOTE — ED Triage Notes (Signed)
 Pt c.o sob at times and chest pain when she takes in a deep breath, the pain radiates through to her back. Pt states her kids are sick with the flu.

## 2024-02-22 NOTE — ED Triage Notes (Signed)
 PT presents for concerns of SOB and multiple syncopal episodes on Saturday.  She has had no more episodes since Saturday but was told she should come get checked out. She is ambulatory, A&O at this time.

## 2024-02-22 NOTE — ED Provider Triage Note (Addendum)
 Emergency Medicine Provider Triage Evaluation Note  Lacey Sanders , a 39 y.o. female  was evaluated in triage.  Pt complains of flu like symptoms of productive cough, congestion x 4 days noted to have been more short of breath on Saturday reporting 3 episodes of syncope (witnessed by husband, did not hit head, downtime approximately a few seconds), with no recurrence. Has felt continued shortness of breath. Notes to be experiencing chest pain radiating to back. Describes pain as pressure. Worse with deep inhalations. Has felt similarly with bad allergies to pollen.   Currently symptoms have improved. Noting to be intermittent.   Endorses odynophagia, watery diarrhea  Denies fever, abdominal pain, n/v , dysuria, hematuria, LE swelling, rashes  Review of Systems  Positive: N/a Negative: N/a  Physical Exam  BP (!) 131/58   Pulse 70   Temp 97.8 F (36.6 C)   Resp 16   LMP 02/01/2024 (Approximate)   SpO2 100%  Gen:   Awake, no distress   Resp:  Normal effort  MSK:   Moves extremities without difficulty  Other:  No conversational dyspnea   Medical Decision Making  Medically screening exam initiated at 9:46 PM.  Appropriate orders placed.  Folashade Gamboa was informed that the remainder of the evaluation will be completed by another provider, this initial triage assessment does not replace that evaluation, and the importance of remaining in the ED until their evaluation is complete.     Beola Terrall RAMAN, PA-C 02/22/24 2152    Beola Terrall RAMAN, [place] 02/22/24 2154

## 2024-02-22 NOTE — Telephone Encounter (Signed)
" °  FYI Only or Action Required?: FYI only for provider: ED advised.  Patient was last seen in primary care on 01/20/2024 by Allwardt, Mardy HERO, PA-C.  Called Nurse Triage reporting Loss of Consciousness and Chest Pain.  Symptoms began several days ago.  Interventions attempted: Nothing.  Symptoms are: unchanged.  Triage Disposition: Call EMS 911 Now  Patient/caregiver understands and will follow disposition?: Yes            Reason for Disposition  [1] Chest pain lasts > 5 minutes AND [2] described as crushing, pressure-like, or heavy  Answer Assessment - Initial Assessment Questions Patient reports Saturday fainted. Had  pressure in chest Saturday as well.  Right now mild pressure in chest and back as well. Constant. At times shortness of breath at times as well. This RN advised 911 for chest pressure. Patient  Home with husband. Prefers to have spouse drive her to ER now and if worsening will call 911.     1. LOCATION: Where does it hurt?       The chest  2. RADIATION: Does the pain go anywhere else? (e.g., into neck, jaw, arms, back)     Did not report radiating pain   3. ONSET: When did the chest pain begin? (Minutes, hours or days)      Today chest pressure constant and Saturday the day passed out  4. PATTERN: Does the pain come and go, or has it been constant since it started?  Does it get worse with exertion?      Constant today  5. DURATION: How long does it last (e.g., seconds, minutes, hours)     Constant today  6. SEVERITY: How bad is the pain?  (e.g., Scale 1-10; mild, moderate, or severe)     Pressure feeling 7. CARDIAC RISK FACTORS: Do you have any history of heart problems or risk factors for heart disease? (e.g., angina, prior heart attack; diabetes, high blood pressure, high cholesterol, smoker, or strong family history of heart disease)     Denies   9. CAUSE: What do you think is causing the chest pain?     Unknown  10. OTHER  SYMPTOMS: Do you have any other symptoms? (e.g., dizziness, nausea, vomiting, sweating, fever, difficulty breathing, cough)       Did pass out Saturday and reports some shortness of breath as well  11. PREGNANCY: Is there any chance you are pregnant? When was your last menstrual period?       Did not report  Protocols used: Chest Pain-A-AH   Copied from CRM [id number]. Topic: Clinical - Red Word Triage >> Feb 22, 2024  3:58 PM Thersia BROCKS wrote: Red Word that prompted transfer to Nurse Triage: Patient called in stated she fainted on saturday, Patient stated her chest is tight, and she having trouble breathing "

## 2024-02-22 NOTE — Telephone Encounter (Signed)
" °  FYI Only or Action Required?: FYI only for provider: ED advised.  Patient was last seen in primary care on 01/20/2024 by Allwardt, Mardy HERO, PA-C.  Called Nurse Triage reporting Loss of Consciousness.  Symptoms began several days ago.  Interventions attempted: Nothing.    Triage Disposition: Go to ED Now  Patient/caregiver understands and will follow disposition?: Yes  Copied from CRM [id number]. Topic: Clinical - Red Word Triage >> Feb 22, 2024  4:23 PM Fonda T wrote: Red Word that prompted transfer to Nurse Triage: Pt spouse, [place], states pt is having episodes of feeling light headed, and fainting, and difficulty breathing, and loss of hearing, with this occurrence.  Initial occurrence happened on Saturday 02/20/24, and fainted, came back to, and then fainted again. This occurred on same day about four times.   Pt spouse states he tried to get her to go to ER after occurrence, pt declined. Pt spouse is requesting a appt for evaluation. Reason for Disposition  Chest pain  Answer Assessment - Initial Assessment Questions 1. ONSET: How long were you unconscious? (e.g., minutes, seconds) When did it happen?     Saturday, patient was in shower and felt faint- no hearing and SOB 2. CONTENT: What happened during the period of unconsciousness? (e.g., seizure activity)      Patient fainted- came to and fainted 4-5 more times 3. MENTAL STATUS: Alert and oriented now? (e.g., oriented x 3 = name, month, location)      Alert now 4. TRIGGER: What do you think caused the fainting? What were you doing just before you fainted?  (e.g., exercise, sudden standing up, prolonged standing)     unsure 5. RECURRENT SYMPTOM: Have you ever passed out before? If Yes, ask: When was the last time? and What happened that time?      no 6. INJURY: Did you hurt yourself when you fell?      no 7. CARDIAC SYMPTOMS: Have you had any of the following symptoms: chest pain, difficulty breathing,  palpitations?     Chest discomfort today  Protocols used: Fainting-A-AH  "

## 2024-02-23 ENCOUNTER — Ambulatory Visit: Payer: Self-pay

## 2024-02-23 LAB — TROPONIN T, HIGH SENSITIVITY
Troponin T High Sensitivity: <15 ng/L (ref 0–19)
Troponin T High Sensitivity: <15 ng/L (ref 0–19)

## 2024-02-23 NOTE — ED Notes (Signed)
 Pt stated they are going to another place to get treatment.

## 2024-02-23 NOTE — Telephone Encounter (Signed)
 Please contact pt to schedule ED follow up in office

## 2024-02-23 NOTE — Telephone Encounter (Signed)
 LVM to schedule HFU/ED through interpreter

## 2024-02-23 NOTE — Telephone Encounter (Signed)
 FYI pt advised to go to ED

## 2024-02-23 NOTE — Telephone Encounter (Signed)
" °  FYI Only or Action Required?: FYI only for provider: appointment scheduled on 1/13 for HFU.  Patient was last seen in primary care on 01/20/2024 by Allwardt, Mardy HERO, PA-C.  Called Nurse Triage reporting Chest Pain.  Symptoms began several days ago.  Interventions attempted: Nothing.  Symptoms are: stable.  Triage Disposition: See Physician Within 24 Hours  Patient/caregiver understands and will follow disposition?:    Copied from CRM #[id number]. Topic: Clinical - Red Word Triage >> Feb 23, 2024  4:00 PM Alexandria E wrote: [facility] that prompted transfer to Nurse Triage: Chest discomfort, patient passed out this past Saturday, was advised to go to the ED, which patient did but was never seen by a doctor as the wait was too long. Reason for Disposition  [1] Chest pain lasts > 5 minutes AND [2] occurred > 3 days ago (72 hours) AND [3] NO chest pain or cardiac symptoms now  Answer Assessment - Initial Assessment Questions 1. LOCATION: Where does it hurt?       Mid chest 2. RADIATION: Does the pain go anywhere else? (e.g., into neck, jaw, arms, back)     To back 3. ONSET: When did the chest pain begin? (Minutes, hours or days)      Saturday 4. PATTERN: Does the pain come and go, or has it been constant since it started?  Does it get worse with exertion?      intermittent 5. DURATION: How long does it last (e.g., seconds, minutes, hours)      6. SEVERITY: How bad is the pain?  (e.g., Scale 1-10; mild, moderate, or severe)     0-1 7. CARDIAC RISK FACTORS: Do you have any history of heart problems or risk factors for heart disease? (e.g., angina, prior heart attack; diabetes, high blood pressure, high cholesterol, smoker, or strong family history of heart disease)     no 8. PULMONARY RISK FACTORS: Do you have any history of lung disease?  (e.g., blood clots in lung, asthma, emphysema, birth control pills)     no 9. CAUSE: What do you think is causing the chest  pain?      10. OTHER SYMPTOMS: Do you have any other symptoms? (e.g., dizziness, nausea, vomiting, sweating, fever, difficulty breathing, cough)       Denies any other symptoms  Protocols used: Chest Pain-A-AH  "

## 2024-02-23 NOTE — Telephone Encounter (Signed)
 Previous triage note sent to PCP for review

## 2024-02-24 ENCOUNTER — Telehealth: Payer: Self-pay | Admitting: Physician Assistant

## 2024-02-24 NOTE — Telephone Encounter (Signed)
 Called Patient through interpreter. Patient states she no longer is having chest pain (states it feels more like asthma or allergies), difficulty breathing or fainting episodes. Advised Patient that if she is any more episodes to go back to ED and stay until seen/diagnosed by ED Provider.  Patient is scheduled for HFU on 03/01/24 at 8:30 am.

## 2024-02-24 NOTE — Telephone Encounter (Signed)
 Please see additional phone encounter in chart

## 2024-02-24 NOTE — Telephone Encounter (Signed)
 Please review and advise if pt still needs ED follow up, pt went to ED completed labs however left without being seen.

## 2024-02-24 NOTE — Telephone Encounter (Signed)
 Noted

## 2024-02-25 ENCOUNTER — Encounter: Payer: Self-pay | Admitting: Dermatology

## 2024-02-25 ENCOUNTER — Ambulatory Visit: Admitting: Dermatology

## 2024-02-25 DIAGNOSIS — D3614 Benign neoplasm of peripheral nerves and autonomic nervous system of thorax: Secondary | ICD-10-CM | POA: Diagnosis not present

## 2024-02-25 DIAGNOSIS — D485 Neoplasm of uncertain behavior of skin: Secondary | ICD-10-CM | POA: Diagnosis not present

## 2024-02-25 DIAGNOSIS — L810 Postinflammatory hyperpigmentation: Secondary | ICD-10-CM | POA: Diagnosis not present

## 2024-02-25 NOTE — Progress Notes (Addendum)
" ° °  Follow-Up Visit  Patient (and/or pt guardian) consented to the use of AI-assisted tools for note generation.    Subjective  Lacey Sanders is a 39 y.o. female who presents for the following: Mole removal on upper left chest and Post-Inflammatory Hyperpigmentation  Patient was last evaluated on 10/12/23.   - Post Inflammatory Hyperpigmentation At this visit patient was prescribed Melaxemic cream from [facility] to use twice daily.  Recommended use of suncreen daily Patient reports sxs are a little better and has noticed some lightening but states that she has not been using the medication consistently  Patient reports medication changes and has recently been prescribed Metformin  for glucose levels and PCOS  During 10/12/23 appointment, removal of Nevus on left chest was deferred to be performed today  The following portions of the chart were reviewed this encounter and updated as appropriate: medications, allergies, medical history  Review of Systems:  No other skin or systemic complaints except as noted in HPI or Assessment and Plan.  Objective  Well appearing patient in no apparent distress; mood and affect are within normal limits.  A focused examination was performed of the following areas: Chest  Relevant exam findings are noted in the Assessment and Plan.                  Left Upper Chest 5mm light brown papule   Assessment & Plan   POST-INFLAMMATORY HYPERPIGMENTATION (PIH) Exam: hyperpigmented macules and/or patches at neck   Post-inflammatory hyperpimentation (PIH)  is a benign condition that comes from having previous inflammation in the skin and will fade with time over months to sometimes years. Recommend daily sun protection including sunscreen SPF 30+ to sun-exposed areas. - Recommend treating any itchy or red areas on the skin quickly to prevent new areas of PIH. Once rash has cleared, treating with prescription medicines such as  hydroquinone may help fade dark spots faster.    Treatment Plan: Continue with Melaxemic cream from [facility] Recommended use of sunscreen  NEOPLASM OF UNCERTAIN BEHAVIOR OF SKIN Left Upper Chest - Epidermal / dermal shaving  Lesion diameter (cm):  0.5 Informed consent: discussed and consent obtained   Timeout: patient name, date of birth, surgical site, and procedure verified   Procedure prep:  Patient was prepped and draped in usual sterile fashion Prep type:  Isopropyl alcohol Anesthesia: the lesion was anesthetized in a standard fashion   Anesthetic:  1% lidocaine  w/ epinephrine  1-100,000 local infiltration Instrument used: DermaBlade   Hemostasis achieved with: aluminum chloride   Outcome: patient tolerated procedure well   Post-procedure details: sterile dressing applied and wound care instructions given   Dressing type: bandage and petrolatum    Specimen A - Surgical pathology Differential Diagnosis: r/o DN  Check Margins: No POST-INFLAMMATORY HYPERPIGMENTATION    Return if symptoms worsen or fail to improve.  LILLETTE Lyle Cords, am acting as a neurosurgeon for [employer], DO .   Documentation: I have reviewed the above documentation for accuracy and completeness, and I agree with the above.  Delon Lenis, DO   "

## 2024-02-25 NOTE — Patient Instructions (Addendum)
 Other Procedure(Skin Tags): Quote for cosmetic removal:  $200 to remove Up to 15 lesions $300 to remove  16 to 25 $400 to removal  26-35 $10 per tag for each additional    Patient Handout: Wound Care for Skin Biopsy Site  Taking Care of Your Skin Biopsy Site  Proper care of the biopsy site is essential for promoting healing and minimizing scarring. This handout provides instructions on how to care for your biopsy site to ensure optimal recovery.  1. Cleaning the Wound:  Clean the biopsy site daily with gentle soap and water. Gently pat the area dry with a clean, soft towel. Avoid harsh scrubbing or rubbing the area, as this can irritate the skin and delay healing.  2. Applying Aquaphor and Bandage:  After cleaning the wound, apply a thin layer of Aquaphor ointment to the biopsy site. Cover the area with a sterile bandage to protect it from dirt, bacteria, and friction. Change the bandage daily or as needed if it becomes soiled or wet.  3. Continued Care for One Week:  Repeat the cleaning, Aquaphor application, and bandaging process daily for one week following the biopsy procedure. Keeping the wound clean and moist during this initial healing period will help prevent infection and promote optimal healing.  4. Massaging Aquaphor into the Area:  ---After one week, discontinue the use of bandages but continue to apply Aquaphor to the biopsy site. ----Gently massage the Aquaphor into the area using circular motions. ---Massaging the skin helps to promote circulation and prevent the formation of scar tissue.   Additional Tips:  Avoid exposing the biopsy site to direct sunlight during the healing process, as this can cause hyperpigmentation or worsen scarring. If you experience any signs of infection, such as increased redness, swelling, warmth, or drainage from the wound, contact your healthcare provider immediately. Follow any additional instructions provided by your  healthcare provider for caring for the biopsy site and managing any discomfort. Conclusion:  Taking proper care of your skin biopsy site is crucial for ensuring optimal healing and minimizing scarring. By following these instructions for cleaning, applying Aquaphor, and massaging the area, you can promote a smooth and successful recovery. If you have any questions or concerns about caring for your biopsy site, don't hesitate to contact your healthcare provider for guidance.      Important Information  Due to recent changes in healthcare laws, you may see results of your pathology and/or laboratory studies on MyChart before the doctors have had a chance to review them. We understand that in some cases there may be results that are confusing or concerning to you. Please understand that not all results are received at the same time and often the doctors may need to interpret multiple results in order to provide you with the best plan of care or course of treatment. Therefore, we ask that you please give us  2 business days to thoroughly review all your results before contacting the office for clarification. Should we see a critical lab result, you will be contacted sooner.   If You Need Anything After Your Visit  If you have any questions or concerns for your doctor, please call our main line at [phone] If no one answers, please leave a voicemail as directed and we will return your call as soon as possible. Messages left after 4 pm will be answered the following business day.   You may also send us  a message via MyChart. We typically respond to MyChart messages  within 1-2 business days.  For prescription refills, please ask your pharmacy to contact our office. Our fax number is [phone].  If you have an urgent issue when the clinic is closed that cannot wait until the next business day, you can page your doctor at the number below.    Please note that while we do our best to be available  for urgent issues outside of office hours, we are not available 24/7.   If you have an urgent issue and are unable to reach us , you may choose to seek medical care at your doctor's office, retail clinic, urgent care center, or emergency room.  If you have a medical emergency, please immediately call 911 or go to the emergency department. In the event of inclement weather, please call our main line at [phone] for an update on the status of any delays or closures.  Dermatology Medication Tips: Please keep the boxes that topical medications come in in order to help keep track of the instructions about where and how to use these. Pharmacies typically print the medication instructions only on the boxes and not directly on the medication tubes.   If your medication is too expensive, please contact our office at [phone] or send us  a message through MyChart.   We are unable to tell what your co-pay for medications will be in advance as this is different depending on your insurance coverage. However, we may be able to find a substitute medication at lower cost or fill out paperwork to get insurance to cover a needed medication.   If a prior authorization is required to get your medication covered by your insurance company, please allow us  1-2 business days to complete this process.  Drug prices often vary depending on where the prescription is filled and some pharmacies may offer cheaper prices.  The website www.goodrx.com contains coupons for medications through different pharmacies. The prices here do not account for what the cost may be with help from insurance (it may be cheaper with your insurance), but the website can give you the price if you did not use any insurance.  - You can print the associated coupon and take it with your prescription to the pharmacy.  - You may also stop by our office during regular business hours and pick up a GoodRx coupon card.  - If you need your prescription  sent electronically to a different pharmacy, notify our office through [facility] or by phone at [phone]

## 2024-02-26 LAB — SURGICAL PATHOLOGY

## 2024-03-01 ENCOUNTER — Encounter: Payer: Self-pay | Admitting: Physician Assistant

## 2024-03-01 ENCOUNTER — Ambulatory Visit: Admitting: Physician Assistant

## 2024-03-01 ENCOUNTER — Ambulatory Visit: Payer: Self-pay | Admitting: Dermatology

## 2024-03-01 VITALS — BP 112/74 | HR 70 | Temp 98.6°F | Ht 62.0 in | Wt 196.8 lb

## 2024-03-01 DIAGNOSIS — R55 Syncope and collapse: Secondary | ICD-10-CM

## 2024-03-01 DIAGNOSIS — R7303 Prediabetes: Secondary | ICD-10-CM

## 2024-03-01 DIAGNOSIS — R0789 Other chest pain: Secondary | ICD-10-CM

## 2024-03-01 DIAGNOSIS — Z23 Encounter for immunization: Secondary | ICD-10-CM | POA: Diagnosis not present

## 2024-03-01 NOTE — Patient Instructions (Addendum)
" °  VISIT SUMMARY: During your visit, we discussed your recent syncope episode and associated symptoms, including chest tightness and dyspnea. We also reviewed your management plan for prediabetes.   YOUR PLAN: SYNCOPE: You experienced a syncope episode on January 5th, 2026, with multiple brief losses of consciousness in the shower. No significant findings were noted in the ER. This may have been a vasovagal event possibly triggered by heat and hypoglycemia due to metformin . -Ensure you take metformin  with meals, preferably with dinner. -Monitor for any recurrent syncope episodes and report them if they occur. -We will consider referring you to a cardiologist and neurologist if syncope recurs.  CHEST TIGHTNESS AND DYSPNEA: You have intermittent chest tightness and difficulty breathing, especially during pollen season. These symptoms may be related to possible asthma or anxiety. -Use your rescue inhaler as needed for chest tightness, up to every 4-6 hours. -Track your symptoms in a journal, noting frequency and associated factors. -You are referred to an allergy and asthma specialist for further evaluation.  PREDIABETES: You are currently managing your PREdiabetes with metformin . The recent syncope episode may have been related to hypoglycemia from metformin . -Continue taking metformin  with consistent timing, preferably with dinner. -Monitor your blood sugar levels and report any significant changes.           RESUMEN DE LA VISITA: Durante su visita, hablamos sobre su reciente episodio de sncope y los sntomas asociados, incluyendo opresin en el pecho y dificultad para respirar. Tambin revisamos su plan de tratamiento para la prediabetes.   SU PLAN: SNCOPE: Usted experiment un episodio de sncope el 5 de enero de 2026, con varias prdidas breves de conocimiento en la ducha. No se encontraron hallazgos significativos en la sala de emergencias. Esto pudo haber sido un evento  vasovagal, posiblemente desencadenado por el calor y la hipoglucemia debido a la metformina. - Asegrese de tomar la metformina con las comidas, preferiblemente con la cena. - Est atento a cualquier episodio de sncope recurrente e infrmenos si ocurre. - Consideraremos derivarlo a un cardilogo y un neurlogo si el sncope se repite.  OPRESIN EN EL PECHO Y DIFICULTAD PARA RESPIRAR: Usted presenta opresin intermitente en el pecho y dificultad para respirar, especialmente durante la temporada de polen. Estos sntomas pueden estar relacionados con posible asma o ansiedad. - Use su inhalador de rescate segn sea necesario para la opresin en el pecho, hasta cada 4 a 6 horas. - Registre sus sntomas en un diario, anotando la frecuencia y los factores asociados. - Se le ha derivado a un especialista en alergias y asma para una evaluacin adicional.  PREDIABETES: Actualmente est controlando su prediabetes con metformina. El reciente episodio de sncope pudo haber estado relacionado con hipoglucemia causada por la metformina. - Contine tomando metformina a la misma hora, preferiblemente con la cena. - Controle sus niveles de azcar en la sangre e informe cualquier cambio significativo.           Contains text generated by [employer].                                 Contains text generated by [employer]bridge.   "

## 2024-03-01 NOTE — Progress Notes (Signed)
 "   Patient ID: Lacey Sanders, female    DOB: [birth year], 39 y.o.   MRN: [id number]   Assessment & Plan:  Syncope, unspecified syncope type  Feeling of chest tightness -     Ambulatory referral to Allergy  Immunization due -     Flu vaccine trivalent PF, 6mos and older(Flulaval,Afluria,Fluarix,Fluzone)  Prediabetes     Assessment & Plan Syncope Episode on January 5th, 2026, with multiple brief loss of consciousness episodes in the shower. No significant findings on ER labs or chest x-ray. She left AMA. Possible vasovagal event triggered by heat and possibly hypoglycemia due to metformin . No prior history of syncope except once due to ovarian cyst pain. No seizure activity noted. Stress and lack of sleep may have contributed. - Ensure consistent timing of metformin  with meals, preferably with dinner. - Monitor for recurrent syncope episodes and report if she occurs. - Will consider cardiology and neurology referral if syncope recurs.  Chest tightness and dyspnea, possible asthma or anxiety Intermittent chest tightness and dyspnea, especially during pollen season. No asthma diagnosis, but symptoms suggestive of possible underlying asthma or anxiety. No current use of rescue inhaler. Symptoms may be exacerbated by stress and anxiety. - Use rescue inhaler as needed for chest tightness, up to every 4-6 hours. - Track symptoms in a journal, noting frequency and associated factors. - Referred to allergy and asthma specialist for further evaluation.  Prediabetes Currently managed with metformin . Recent syncope episode may have been related to hypoglycemia from metformin . - Continue metformin  with consistent timing, preferably with dinner. - Monitor blood sugar levels and report any significant changes.   F/up as scheduled in March    Subjective:    Chief Complaint  Patient presents with   Follow-up    Pt in office for ED follow up due to syncope; pt states she is  doing better since discharge; pt states its not really chest pain, it something bothering her in her chest  per patient when breathing feels to cold and happens when taking a deep breath, happens more in pollen season as well and feels harder to breathe; pt using albuterol  inhaler as need. Pt also complains of abdominal pain/ feels like inflammation and abdomen gets hard    HPI Discussed the use of AI scribe software for clinical note transcription with the patient, who gave verbal consent to proceed.  History of Present Illness Lacey Sanders is a 39 year old female who presents for ER follow-up due to syncope.  Due to language barrier, an interpreter was present during the history-taking and subsequent discussion (and for part of the physical exam) with this patient.   She experienced a syncope episode on February 22, 2024, which led to an ER visit. She left the ER against medical advice after waiting for nine hours due to overcrowding. During the ER visit, blood work was performed, including troponin levels, which were negative, and a chest x-ray, which was reported as normal. Her husband witnessed the episode, which involved multiple instances of passing out and regaining consciousness over a ten-minute period while she was in the shower. She had eaten and taken her prescribed metformin  prior to the episode.  She reports a sensation in her chest that is not painful but bothersome, particularly when taking deep breaths. This sensation worsens during pollen season, where she experiences cough and difficulty breathing, similar to asthma symptoms. She has a rescue inhaler but has not used it since the ER visit. No history of  asthma outside of allergy seasons.  She has a history of ovarian cysts, which previously caused severe pain and a single syncope episode two to three years ago. She also reports stress and sleep deprivation due to her children being sick with the flu around the time of  her recent syncope episode.  She is currently taking metformin  for sugar control, which she took around noon on the day of the syncope episode.     Past Medical History:  Diagnosis Date   Anxiety    Fatigue    Frequent headaches    Heart burn    Joint pain    Medical history non-contributory     Past Surgical History:  Procedure Laterality Date   CESAREAN SECTION     WISDOM TOOTH EXTRACTION Bilateral     Family History  Problem Relation Age of Onset   Hyperlipidemia Mother    Asthma Father    Hypertension Father    Hypertension Sister    Hyperlipidemia Brother    Learning disabilities Brother    Hypertension Maternal Grandmother    High Cholesterol Maternal Grandmother    Diabetes Paternal Grandmother    Hypotension Paternal Grandmother    Hypertension Paternal Grandfather     Social History[1]   Allergies[2]  Review of Systems NEGATIVE UNLESS OTHERWISE INDICATED IN HPI      Objective:     BP 112/74 (BP Location: Left Arm, Patient Position: Sitting, Cuff Size: Large)   Pulse 70   Temp 98.6 F (37 C) (Temporal)   Ht 5' 2 (1.575 m)   Wt 196 lb 12.8 oz (89.3 kg)   LMP 02/01/2024 (Approximate)   SpO2 99%   BMI 36.00 kg/m   Wt Readings from Last 3 Encounters:  03/01/24 196 lb 12.8 oz (89.3 kg)  01/20/24 202 lb 9.6 oz (91.9 kg)  01/05/24 205 lb 3.2 oz (93.1 kg)    BP Readings from Last 3 Encounters:  03/01/24 112/74  02/23/24 (!) 116/59  01/20/24 124/68     Physical Exam Vitals and nursing note reviewed.  Constitutional:      General: She is not in acute distress.    Appearance: Normal appearance. She is not ill-appearing.  HENT:     Head: Normocephalic.     Right Ear: External ear normal.     Left Ear: External ear normal.  Eyes:     Extraocular Movements: Extraocular movements intact.     Conjunctiva/sclera: Conjunctivae normal.     Pupils: Pupils are equal, round, and reactive to light.  Cardiovascular:     Rate and Rhythm: Normal  rate and regular rhythm.     Pulses: Normal pulses.     Heart sounds: Normal heart sounds. No murmur heard. Pulmonary:     Effort: Pulmonary effort is normal. No respiratory distress.     Breath sounds: Normal breath sounds. No wheezing.  Musculoskeletal:     Cervical back: Normal range of motion.  Skin:    General: Skin is warm.  Neurological:     General: No focal deficit present.     Mental Status: She is alert and oriented to person, place, and time.     Cranial Nerves: No cranial nerve deficit.     Motor: No weakness.     Gait: Gait normal.  Psychiatric:        Mood and Affect: Mood normal.        Behavior: Behavior normal.  Berl Bonfanti M Baili Stang, PA-C     [1]  Social History Tobacco Use   Smoking status: Never   Smokeless tobacco: Never  Vaping Use   Vaping status: Never Used  Substance Use Topics   Alcohol use: Yes    Alcohol/week: 3.0 standard drinks of alcohol    Types: 2 Glasses of wine, 1 Shots of liquor per week    Comment: per month   Drug use: No  [2]  Allergies Allergen Reactions   Bee Pollen    "

## 2024-04-11 ENCOUNTER — Ambulatory Visit: Payer: Self-pay | Admitting: Internal Medicine

## 2024-04-19 ENCOUNTER — Ambulatory Visit: Admitting: Physician Assistant
